# Patient Record
Sex: Female | Born: 1960
Health system: Southern US, Community
[De-identification: ages and names within clinical notes are randomized; demographics above are authoritative.]

## PROBLEM LIST (undated history)

## (undated) DIAGNOSIS — M170 Bilateral primary osteoarthritis of knee: Secondary | ICD-10-CM

## (undated) DIAGNOSIS — E559 Vitamin D deficiency, unspecified: Secondary | ICD-10-CM

## (undated) DIAGNOSIS — D219 Benign neoplasm of connective and other soft tissue, unspecified: Secondary | ICD-10-CM

## (undated) DIAGNOSIS — M25562 Pain in left knee: Secondary | ICD-10-CM

## (undated) DIAGNOSIS — J45909 Unspecified asthma, uncomplicated: Secondary | ICD-10-CM

## (undated) DIAGNOSIS — M1990 Unspecified osteoarthritis, unspecified site: Secondary | ICD-10-CM

## (undated) DIAGNOSIS — Z9889 Other specified postprocedural states: Secondary | ICD-10-CM

## (undated) DIAGNOSIS — N921 Excessive and frequent menstruation with irregular cycle: Secondary | ICD-10-CM

## (undated) DIAGNOSIS — N841 Polyp of cervix uteri: Secondary | ICD-10-CM

## (undated) HISTORY — PX: COMBINED HYSTEROSCOPY DIAGNOSTIC / D&C: SUR297

## (undated) HISTORY — DX: Excessive and frequent menstruation with irregular cycle: N92.1

## (undated) HISTORY — PX: TONSILLECTOMY: SUR1361

## (undated) HISTORY — DX: Polyp of cervix uteri: N84.1

## (undated) HISTORY — PX: KNEE SURGERY: SHX244

## (undated) HISTORY — DX: Other specified postprocedural states: Z98.890

## (undated) HISTORY — DX: Benign neoplasm of connective and other soft tissue, unspecified: D21.9

## (undated) HISTORY — DX: Unspecified asthma, uncomplicated: J45.909

## (undated) HISTORY — DX: Vitamin D deficiency, unspecified: E55.9

---
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
## 1993-08-15 HISTORY — PX: TUBAL LIGATION: SHX77

## 2001-07-24 ENCOUNTER — Encounter: Payer: Self-pay | Admitting: Internal Medicine

## 2001-07-24 ENCOUNTER — Encounter: Admission: RE | Admit: 2001-07-24 | Discharge: 2001-07-24 | Payer: Self-pay | Admitting: Internal Medicine

## 2001-08-07 ENCOUNTER — Other Ambulatory Visit: Admission: RE | Admit: 2001-08-07 | Discharge: 2001-08-07 | Payer: Self-pay | Admitting: Internal Medicine

## 2001-09-06 ENCOUNTER — Encounter: Payer: Self-pay | Admitting: Internal Medicine

## 2001-09-06 ENCOUNTER — Encounter: Admission: RE | Admit: 2001-09-06 | Discharge: 2001-09-06 | Payer: Self-pay | Admitting: Internal Medicine

## 2002-10-04 ENCOUNTER — Encounter: Payer: Self-pay | Admitting: Internal Medicine

## 2002-10-04 ENCOUNTER — Encounter: Admission: RE | Admit: 2002-10-04 | Discharge: 2002-10-04 | Payer: Self-pay | Admitting: Internal Medicine

## 2003-10-09 ENCOUNTER — Encounter: Admission: RE | Admit: 2003-10-09 | Discharge: 2003-10-09 | Payer: Self-pay | Admitting: Internal Medicine

## 2004-10-14 ENCOUNTER — Encounter: Admission: RE | Admit: 2004-10-14 | Discharge: 2004-10-14 | Payer: Self-pay | Admitting: Internal Medicine

## 2005-10-27 ENCOUNTER — Encounter: Admission: RE | Admit: 2005-10-27 | Discharge: 2005-10-27 | Payer: Self-pay | Admitting: Internal Medicine

## 2006-11-02 ENCOUNTER — Encounter: Admission: RE | Admit: 2006-11-02 | Discharge: 2006-11-02 | Payer: Self-pay | Admitting: Internal Medicine

## 2007-11-14 ENCOUNTER — Encounter: Admission: RE | Admit: 2007-11-14 | Discharge: 2007-11-14 | Payer: Self-pay | Admitting: Internal Medicine

## 2007-11-14 LAB — CONVERTED CEMR LAB
ALT: 21 units/L
AST: 22 units/L
Albumin: 0.5 g/dL
Alkaline Phosphatase: 67 units/L
BUN: 13 mg/dL
CO2: 20 meq/L
Calcium: 9.6 mg/dL
Chloride: 105 meq/L
Creatinine, Ser: 0.92 mg/dL
Glucose, Bld: 95 mg/dL
Hemoglobin: 13.4 g/dL
Platelets: 288 10*3/uL
Potassium: 4 meq/L
RBC: 4.4 M/uL
Sodium: 138 meq/L
TSH: 0.542 microintl units/mL
Total Bilirubin: 0.5 mg/dL
Total Protein: 7.4 g/dL
WBC: 6 10*3/uL

## 2007-12-05 ENCOUNTER — Encounter: Admission: RE | Admit: 2007-12-05 | Discharge: 2007-12-05 | Payer: Self-pay | Admitting: Internal Medicine

## 2008-11-26 ENCOUNTER — Encounter: Admission: RE | Admit: 2008-11-26 | Discharge: 2008-11-26 | Payer: Self-pay | Admitting: Internal Medicine

## 2008-11-26 ENCOUNTER — Encounter: Payer: Self-pay | Admitting: Internal Medicine

## 2008-12-03 ENCOUNTER — Encounter: Payer: Self-pay | Admitting: Internal Medicine

## 2008-12-03 ENCOUNTER — Encounter: Admission: RE | Admit: 2008-12-03 | Discharge: 2008-12-03 | Payer: Self-pay | Admitting: Internal Medicine

## 2008-12-17 HISTORY — PX: ENDOMETRIAL BIOPSY: SHX622

## 2009-11-19 LAB — CONVERTED CEMR LAB
ALT: 21 units/L
AST: 19 units/L
Albumin: 4.7 g/dL
Alkaline Phosphatase: 57 units/L
BUN: 11 mg/dL
CO2: 22 meq/L
Calcium: 10.1 mg/dL
Chloride: 102 meq/L
Cholesterol: 147 mg/dL
Creatinine, Ser: 0.94 mg/dL
Glucose, Bld: 99 mg/dL
HDL: 70 mg/dL
Hemoglobin: 12.5 g/dL
LDL Cholesterol: 66 mg/dL
Platelets: 336 10*3/uL
Potassium: 4.3 meq/L
RBC: 4.05 M/uL
Sodium: 136 meq/L
Total Bilirubin: 0.5 mg/dL
Total Protein: 7.9 g/dL
Triglyceride fasting, serum: 55 mg/dL
WBC: 5.5 10*3/uL

## 2009-11-25 ENCOUNTER — Encounter: Payer: Self-pay | Admitting: Internal Medicine

## 2009-11-25 LAB — CONVERTED CEMR LAB
ALT: 20 units/L
AST: 16 units/L
Albumin: 4.5 g/dL
Alkaline Phosphatase: 57 units/L
BUN: 10 mg/dL
CO2: 23 meq/L
Calcium: 10.1 mg/dL
Chloride: 100 meq/L
Cholesterol: 156 mg/dL
Creatinine, Ser: 0.82 mg/dL
Glucose, Bld: 89 mg/dL
Hemoglobin: 13.3 g/dL
Hgb A1c MFr Bld: 6.4 %
LDL Cholesterol: 70 mg/dL
Platelets: 299 10*3/uL
Potassium: 3.8 meq/L
RBC: 4.33 M/uL
Sodium: 135 meq/L
Total Bilirubin: 0.6 mg/dL
Total Protein: 7.3 g/dL
Triglyceride fasting, serum: 4976 mg/dL
WBC: 5 10*3/uL

## 2009-12-08 ENCOUNTER — Encounter: Admission: RE | Admit: 2009-12-08 | Discharge: 2009-12-08 | Payer: Self-pay | Admitting: Internal Medicine

## 2009-12-08 LAB — HM MAMMOGRAPHY: HM Mammogram: NEGATIVE

## 2009-12-23 ENCOUNTER — Ambulatory Visit: Payer: Self-pay | Admitting: Internal Medicine

## 2009-12-23 DIAGNOSIS — E119 Type 2 diabetes mellitus without complications: Secondary | ICD-10-CM

## 2009-12-23 DIAGNOSIS — R7303 Prediabetes: Secondary | ICD-10-CM | POA: Insufficient documentation

## 2009-12-23 DIAGNOSIS — E559 Vitamin D deficiency, unspecified: Secondary | ICD-10-CM | POA: Insufficient documentation

## 2010-02-16 ENCOUNTER — Telehealth: Payer: Self-pay | Admitting: Internal Medicine

## 2010-03-18 ENCOUNTER — Ambulatory Visit: Payer: Self-pay | Admitting: Internal Medicine

## 2010-03-18 LAB — CONVERTED CEMR LAB: Blood Glucose, Fingerstick: 124

## 2010-03-18 LAB — HM DIABETES FOOT EXAM

## 2010-03-22 ENCOUNTER — Telehealth: Payer: Self-pay | Admitting: Internal Medicine

## 2010-06-02 ENCOUNTER — Ambulatory Visit: Payer: Self-pay | Admitting: Internal Medicine

## 2010-06-02 LAB — CONVERTED CEMR LAB: Hgb A1c MFr Bld: 6.5 % (ref 4.6–6.5)

## 2010-09-05 ENCOUNTER — Encounter: Payer: Self-pay | Admitting: Internal Medicine

## 2010-09-14 NOTE — Assessment & Plan Note (Signed)
Summary: CHEST CONGESTION/[facility]WS   Vital Signs:  Patient profile:   50 year old female Height:      62 inches Weight:      189 pounds BMI:     34.69 O2 Sat:      98 % on Room air Temp:     100.5 degrees F oral Pulse rate:   92 / minute Pulse rhythm:   regular Resp:     16 per minute BP sitting:   120 / 78  (left arm) Cuff size:   large  Vitals Entered By: Lanier Prude, Beverly Gust) (March 18, 2010 2:34 PM)  Nutrition Counseling: Patient's BMI is greater than 25 and therefore counseled on weight management options.  O2 Flow:  Room air CC: cough, fever, sore throat X 5 days, URI symptoms Is Patient Diabetic? Yes Pain Assessment Patient in pain? no      CBG Result 124   Primary Care Provider:  Newt Lukes MD  CC:  cough, fever, sore throat X 5 days, and URI symptoms.  History of Present Illness:  URI Symptoms      This is a 50 year old woman who presents with URI symptoms.  The symptoms began 5 days ago.  The severity is described as mild.  The patient reports sore throat, productive cough, and sick contacts, but denies nasal congestion, clear nasal discharge, purulent nasal discharge, dry cough, and earache.  The patient denies fever, stiff neck, dyspnea, wheezing, rash, vomiting, diarrhea, use of an antipyretic, and response to antipyretic.  The patient also reports muscle aches.  The patient denies headache and severe fatigue.  Risk factors for Strep sinusitis include unilateral facial pain, unilateral nasal discharge, and Strep exposure.  The patient denies the following risk factors for Strep sinusitis: poor response to decongestant, double sickening, tender adenopathy, and absence of cough.    Preventive Screening-Counseling & Management  Alcohol-Tobacco     Alcohol drinks/day: 0     Alcohol Counseling: not indicated; patient does not drink     Smoking Status: quit     Packs/Day: 0.5     Year Quit: 1996     Tobacco Counseling: to remain off tobacco  products  Hep-HIV-STD-Contraception     Hepatitis Risk: no risk noted     HIV Risk: no risk noted     STD Risk: no risk noted      Sexual History:  currently monogamous.        Drug Use:  never.        Blood Transfusions:  no.    Medications Prior to Update: 1)  Biotin 5 Mg Caps (Biotin) .Marland Kitchen.. 1 By Mouth Once Daily 2)  Vitamin E 100 Unit Caps (Vitamin E) .... Daily 3)  Ferrous Sulfate 325 (65 Fe) Mg Tabs (Ferrous Sulfate) .Marland Kitchen.. 1 By Mouth Once Daily 4)  Vitamin D3 1000 Unit Caps (Cholecalciferol) .Marland Kitchen.. 1 By Mouth Once Daily (To Start Once Done With Prescription Vitd) 5)  Oscal 500/200 D-3 500-200 Mg-Unit Tabs (Calcium-Vitamin D) .Marland Kitchen.. 1 By Mouth Two Times A Day  Current Medications (verified): 1)  Biotin 5 Mg Caps (Biotin) .Marland Kitchen.. 1 By Mouth Once Daily 2)  Vitamin E 100 Unit Caps (Vitamin E) .... Daily 3)  Ferrous Sulfate 325 (65 Fe) Mg Tabs (Ferrous Sulfate) .Marland Kitchen.. 1 By Mouth Once Daily 4)  Vitamin D3 1000 Unit Caps (Cholecalciferol) .Marland Kitchen.. 1 By Mouth Once Daily (To Start Once Done With Prescription Vitd) 5)  Oscal 500/200 D-3 500-200 Mg-Unit Tabs (  Calcium-Vitamin D) .Marland Kitchen.. 1 By Mouth Two Times A Day 6)  Avelox 400 Mg Tabs (Moxifloxacin Hcl) .... One By Mouth Once Daily For 7 Days 7)  Mytussin Ac 100-10 Mg/37ml Syrp (Guaifenesin-Codeine) .... 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 Family History Diabetes 1st degree relative  mom - 65 - DM dad - 49 - DM  Social History: Reviewed history from 12/23/2009 and no changes required. 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   Endo:  Denies cold intolerance, excessive hunger, excessive thirst, excessive urination, heat intolerance, polyuria, and weight change.  Physical Exam  General:  alert, well-developed, well-nourished, well-hydrated, appropriate dress, normal appearance, healthy-appearing, cooperative to examination, and overweight-appearing.   Head:  normocephalic, atraumatic, no abnormalities observed, and no abnormalities palpated.   Eyes:  vision grossly intact, pupils equal, pupils round, and pupils reactive to light.   Ears:  R ear normal and L ear normal.   Nose:  External nasal examination shows no deformity or inflammation. Nasal mucosa are pink and moist without lesions or exudates. Mouth:  Oral mucosa and oropharynx without lesions or exudates.  Teeth in good repair. Neck:  supple, full ROM, no masses, no thyromegaly, no thyroid nodules or tenderness, no carotid bruits, no cervical lymphadenopathy, and no neck tenderness.   Lungs:  normal respiratory effort, no intercostal retractions, no accessory  muscle use, normal breath sounds, no dullness, no fremitus, no crackles, and no wheezes.   Heart:  normal rate, regular rhythm, no murmur, no gallop, no rub, and no JVD.   Abdomen:  soft, non-tender, normal bowel sounds, no distention, no masses, no guarding, no rigidity, no rebound tenderness, no hepatomegaly, and no splenomegaly.   Msk:  normal ROM, no joint tenderness, no joint swelling, no joint warmth, no redness over joints, no joint deformities, no joint instability, and no crepitation.   Pulses:  R and L carotid,radial,femoral,dorsalis pedis and posterior tibial pulses are full and equal bilaterally Extremities:  No clubbing, cyanosis, edema, or deformity noted with normal full range of motion of all joints.   Neurologic:  No cranial nerve deficits noted. Station and gait are normal. Plantar reflexes are down-going bilaterally. DTRs are symmetrical throughout. Sensory, motor and coordinative functions appear intact. Skin:  turgor  normal, color normal, no rashes, no suspicious lesions, no ecchymoses, no petechiae, no purpura, no ulcerations, and no edema.   Cervical Nodes:  no anterior cervical adenopathy and no posterior cervical adenopathy.   Axillary Nodes:  no R axillary adenopathy and no L axillary adenopathy.   Inguinal Nodes:  no R inguinal adenopathy and no L inguinal adenopathy.   Psych:  Cognition and judgment appear intact. Alert and cooperative with normal attention span and concentration. No apparent delusions, illusions, hallucinations  Diabetes Management Exam:    Foot Exam (with socks and/or shoes not present):       Sensory-Pinprick/Light touch:          Left medial foot (L-4): normal          Left dorsal foot (L-5): normal          Left lateral foot (S-1): normal          Right medial foot (L-4): normal          Right dorsal foot (L-5): normal          Right lateral foot (S-1): normal       Sensory-Monofilament:          Left foot: normal          Right foot: normal        Inspection:          Left foot: normal          Right foot: normal       Nails:          Left foot: normal          Right foot: normal   Impression & Recommendations:  Problem # 1:  BRONCHITIS-ACUTE (ICD-466.0) Assessment New  Her updated medication list for this problem includes:    Avelox 400 Mg Tabs (Moxifloxacin hcl) ..... One by mouth once daily for 7 days    Mytussin Ac 100-10 Mg/66ml Syrp (Guaifenesin-codeine) .Marland Kitchen... 5-10 ml by mouth qid as needed for cough  Take antibiotics and other medications as directed. Encouraged to push clear liquids, get enough rest, and take acetaminophen as needed. To be seen in 5-7 days if no improvement, sooner if worse.  Problem # 2:  COUGH (ICD-786.2) Assessment: New  Orders: T-2 View CXR (71020TC)  Problem # 3:  DIABETES MELLITUS, TYPE II (ICD-250.00) Assessment: Unchanged  Labs Reviewed: Creat: 0.82 (11/25/2009)    Reviewed HgBA1c results: 6.4 (11/25/2009)  Orders: Glucose, (CBG) (57846)  Complete Medication List: 1)  Biotin 5 Mg Caps (Biotin) .Marland Kitchen.. 1 by mouth once daily 2)  Vitamin E 100 Unit Caps (Vitamin e) .... Daily 3)  Ferrous Sulfate 325 (65 Fe) Mg Tabs (Ferrous sulfate) .Marland Kitchen.. 1 by mouth once daily 4)  Vitamin D3 1000 Unit Caps (Cholecalciferol) .Marland Kitchen.. 1 by mouth once daily (to start once done with prescription vitd) 5)  Oscal 500/200 D-3 500-200 Mg-unit Tabs (Calcium-vitamin d) .Marland Kitchen.. 1 by mouth two times a day 6)  Avelox 400 Mg Tabs (Moxifloxacin hcl) .... One by mouth once daily for 7 days 7)  Mytussin Ac 100-10 Mg/92ml Syrp (Guaifenesin-codeine) .... 5-10 ml by mouth qid as needed for cough  Patient Instructions: 1)  Please schedule a follow-up appointment in 2 weeks. 2)  Take your antibiotic as prescribed until ALL of it is gone, but stop if you develop a rash or swelling and contact our office as soon as possible. 3)  Acute bronchitis symptoms for less than 10 days are not helped by  antibiotics. take over the counter  cough medications. call if no improvment in  5-7 days, sooner if increasing cough, fever, or new symptoms( shortness of breath, chest pain). Prescriptions: MYTUSSIN AC 100-10 MG/5ML SYRP (GUAIFENESIN-CODEINE) 5-10 ml by mouth QID as needed for cough  #8 ounces x 0   Entered and Authorized by:   Etta Grandchild MD   Signed by:   Etta Grandchild MD on 03/18/2010   Method used:   Print then Give to Patient   RxID:   [id number] AVELOX 400 MG TABS (MOXIFLOXACIN HCL) One by mouth once daily for 7 days  #7 x 0   Entered and Authorized by:   Etta Grandchild MD   Signed by:   Etta Grandchild MD on 03/18/2010   Method used:   Samples Given   RxID:   [id number]   Laboratory Results   Blood Tests     CBG Random:: 124mg /dL

## 2010-09-14 NOTE — Progress Notes (Signed)
Summary: Cough syrup req  Phone Note Call from Patient Call back at Work Phone [phone]   Caller: Patient Summary of Call: Pt called stating that she was seen 12/16/2009 by Dr. Yetta Barre and was Rx'd ABX and Mytussin cough syrup. Pt states that cough syrup did not help especially with night time cough. Pt states that she was treated with Promethazine/Codeine cough syrup for Bronchitis by her previous MD a few years agoand this worked well. Pt is requesting new Rx for this cough syrup. Initial call taken by: Margaret Pyle, CMA,  March 22, 2010 8:17 AM  Follow-up for Phone Call        ok to call in (i listed med historically on med list) - thanks Follow-up by: Newt Lukes MD,  March 22, 2010 8:32 AM    New/Updated Medications: PROMETHAZINE VC/CODEINE 6.25-5-10 MG/5ML SYRP (PHENYLEPH-PROMETHAZINE-COD) 5cc by mouth every 4 hours as needed for cough Prescriptions: PROMETHAZINE VC/CODEINE 6.25-5-10 MG/5ML SYRP (PHENYLEPH-PROMETHAZINE-COD) 5cc by mouth every 4 hours as needed for cough  #6 oz x 0   Entered and Authorized by:   Margaret Pyle, CMA   Signed by:   Margaret Pyle, CMA on 03/22/2010   Method used:   Telephoned to ...       [facility]  [address] [phone]* (retail)       [address]       [place], [place]  [id number]       Ph: [phone] or [phone]       Fax: [phone]   RxID:   [id number] PROMETHAZINE VC/CODEINE 6.25-5-10 MG/5ML SYRP (PHENYLEPH-PROMETHAZINE-COD) 5cc by mouth every 4 hours as needed for cough  #6 oz x 0   Entered and Authorized by:   Newt Lukes MD   Signed by:   Newt Lukes MD on 03/22/2010   Method used:   Historical   RxID:   [id number]

## 2010-09-14 NOTE — Assessment & Plan Note (Signed)
Summary: 3 MO ROV /NWS   Vital Signs:  Patient profile:   50 year old female Height:      62 inches (157.48 cm) Weight:      196.0 pounds (89.09 kg) O2 Sat:      97 % on Room air Temp:     98.7 degrees F (37.06 degrees C) oral Pulse rate:   72 / minute BP sitting:   112 / 68  (left arm) Cuff size:   large  Vitals Entered By: Orlan Leavens RMA (June 02, 2010 11:00 AM)  O2 Flow:  Room air CC: 3 month follow-up Is Patient Diabetic? Yes Did you bring your meter with you today? No Pain Assessment Patient in pain? no        Primary Care Provider:  Newt Lukes MD  CC:  3 month follow-up.  History of Present Illness: here for f/u:  1) diet controlled DM2 - has been told prev she needs DM diet and weight loss to avoid DM medications - +FH DM - beginning May 1,2011 instituted self directed dietary changes to cut out sodas, no rice or white bread - also inc exercise throu walking, sit ups and arm weights - feels clothes already fitting better - denies PU/PD  2) vit D defic - reports compliance with ongoing medical treatment and no changes in medication dose or frequency. denies adverse side effects related to current therapy. - no constipation - no bone pain or hx fx - not taking calcium and eats little dairy food  3) obesity - as above, working on diet and weight changes - slow weight gain over time - no skin or bowel changes -    Clinical Review Panels:  Lipid Management   Cholesterol:  156 (11/25/2009)   LDL (bad choesterol):  70 (11/25/2009)   HDL (good cholesterol):  70 (11/19/2009)   Triglycerides:  4976 (11/25/2009)  Diabetes Management   HgBA1C:  6.4 (11/25/2009)   Creatinine:  0.82 (11/25/2009)   Last Foot Exam:  yes (03/18/2010)  CBC   WBC:  5.0 (11/25/2009)   RBC:  4.33 (11/25/2009)   Hgb:  13.3 (11/25/2009)   Platelets:  299 (11/25/2009)  Complete Metabolic Panel   Glucose:  89 (11/25/2009)   Sodium:  135 (11/25/2009)   Potassium:  3.8  (11/25/2009)   Chloride:  100 (11/25/2009)   CO2:  23 (11/25/2009)   BUN:  10 (11/25/2009)   Creatinine:  0.82 (11/25/2009)   Albumin:  4.5 (11/25/2009)   Total Protein:  7.3 (11/25/2009)   Calcium:  10.1 (11/25/2009)   Total Bili:  0.6 (11/25/2009)   Alk Phos:  57 (11/25/2009)   SGPT (ALT):  20 (11/25/2009)   SGOT (AST):  16 (11/25/2009)   Current Medications (verified): 1)  Biotin 5 Mg Caps (Biotin) .Marland Kitchen.. 1 By Mouth Once Daily 2)  Vitamin E 100 Unit Caps (Vitamin E) .... Daily 3)  Ferrous Sulfate 325 (65 Fe) Mg Tabs (Ferrous Sulfate) .Marland Kitchen.. 1 By Mouth Once Daily 4)  Vitamin D3 1000 Unit Caps (Cholecalciferol) .Marland Kitchen.. 1 By Mouth Once Daily (To Start Once Done With Prescription Vitd) 5)  Oscal 500/200 D-3 500-200 Mg-Unit Tabs (Calcium-Vitamin D) .Marland Kitchen.. 1 By Mouth Two Times A Day  Allergies (verified): No Known Drug Allergies  Past History:  Past Medical History: Diabetes mellitus, type II, diet controlled Vitamin D defic obesity  MD roster: gyn - ann roberts  Review of Systems  The patient denies anorexia, weight loss, chest pain, syncope, and headaches.  Physical Exam  General:  overweight-appearing.  alert, well-developed, well-nourished, and cooperative to examination.    Lungs:  normal respiratory effort, no intercostal retractions or use of accessory muscles; normal breath sounds bilaterally - no crackles and no wheezes.    Heart:  normal rate, regular rhythm, no murmur, and no rub. BLE without edema.   Impression & Recommendations:  Problem # 1:  DIABETES MELLITUS, TYPE II (ICD-250.00) diet controlled thus far reviewed need for weight control with diet and aerobic exercise consistently - if rise in a1c, will start metformin and rx glucometer - pt understands and agrees to same Orders: TLB-A1C / Hgb A1C (Glycohemoglobin) (83036-A1C)  Labs Reviewed: Creat: 0.82 (11/25/2009)    Reviewed HgBA1c results: 6.4 (11/25/2009)  Problem # 2:  OBESITY (ICD-278.00)  see  above - Ht: 62 (12/23/2009)   Wt: 194 (12/23/2009)   BMI: 35.61 (12/23/2009)  Ht: 62 (06/02/2010)   Wt: 196.0 (06/02/2010)   BMI: 34.69 (03/18/2010)  Problem # 3:  VITAMIN D DEFICIENCY (ICD-268.9)  prior labs reviewed - s/p rx replacement summer 2011 by gyn currently on OTC vit d + Calcium -   Complete Medication List: 1)  Biotin 5 Mg Caps (Biotin) .Marland Kitchen.. 1 by mouth once daily 2)  Vitamin E 100 Unit Caps (Vitamin e) .... Daily 3)  Ferrous Sulfate 325 (65 Fe) Mg Tabs (Ferrous sulfate) .Marland Kitchen.. 1 by mouth once daily 4)  Vitamin D3 1000 Unit Caps (Cholecalciferol) .Marland Kitchen.. 1 by mouth once daily (to start once done with prescription vitd) 5)  Oscal 500/200 D-3 500-200 Mg-unit Tabs (Calcium-vitamin d) .Marland Kitchen.. 1 by mouth two times a day  Patient Instructions: 1)  it was good to see you today. 2)  test(s) ordered today - your results will be posted on the phone tree for review in 48-72 hours from the time of test completion; call [phone] and enter your 9 digit MRN (listed above on this page, just below your name); if any changes need to be made or there are abnormal results, you will be contacted directly.  3)  Please schedule a follow-up appointment in 3-4 months to monitor diabetes control, call sooner if problems.  4)  it is important that you continue to work on losing weight - monitor your diet and consume fewer calories such as less carbohydrates (sugar) and less fat. you also need to increase your physical activity level - start by walking for 10-20 minutes 3 times per week and work up to 30 minutes 4-5 times each week. dance is good too - keep it up!   Orders Added: 1)  Est. Patient Level IV [09811] 2)  TLB-A1C / Hgb A1C (Glycohemoglobin) [83036-A1C]

## 2010-09-14 NOTE — Letter (Signed)
Summary: [facility]  [facility]   Imported By: Lester L[place] 12/28/2009 09:07:29  _____________________________________________________________________  External Attachment:    Type:   Image     Comment:   External Document

## 2010-09-14 NOTE — Progress Notes (Signed)
Summary: Pt?  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 Pt experienced SOB yesterday but has had improvement today. Pt wanted to inform MD and get any advise, if needed. Initial call taken by: Margaret Pyle, CMA,  February 16, 2010 3:16 PM  Follow-up for Phone Call        if breathing is normal now, nothing more to do except avoid mixture of these fumes in the future - if pain with breathing, SOB or cough, should make OV for eval of same - thanks Follow-up by: Newt Lukes MD,  February 16, 2010 4:00 PM  Additional Follow-up for Phone Call Additional follow up Details #1::        pt advised, states she does have slight cough but will watch for now and sch as needed Additional Follow-up by: Margaret Pyle, CMA,  February 16, 2010 4:05 PM

## 2010-09-14 NOTE — Assessment & Plan Note (Signed)
Summary: NEW [employer] PT  PKG #-- STC   Vital Signs:  Patient profile:   50 year old female Height:      62 inches (157.48 cm) Weight:      194 pounds (88.18 kg) BMI:     35.61 Temp:     98.5 degrees F (36.94 degrees C) oral Pulse rate:   90 / minute BP sitting:   110 / 70  (left arm) Cuff size:   regular  Vitals Entered By: Lamar Sprinkles, CMA (Dec 23, 2009 3:23 PM)  Nutrition Counseling: Patient's BMI is greater than 25 and therefore counseled on weight management options. CC: New Patient. Concerns w/family history diabetes and want decrease weight    Primary Care Provider:  Newt Lukes MD  CC:  New Patient. Concerns w/family history diabetes and want decrease weight .  History of Present Illness: new pt to me and our practice, here to est care - prev followed with [facility] - has records with her today for review -  1) DM? - has been told by recent providers that she is at risk and needs DM diet and weight loss to avoid DM medications - +FH DM - beginning May 1,2011 instituted self directed dietary changes to cut out sodas, no rice or white bread - also inc exercise throu walking, sit ups and arm weights - feels clothes already fitting better - denies PU/PD  2) vit D defic - reports compliance with ongoing medical treatment and no changes in medication dose or frequency. denies adverse side effects related to current therapy. - no constipation - no bone pain or hx fx - not taking calcium and eats little dairy food  3) obesity - as above, working on diet and weight changes - slow weight gain over - no skin or bowel changes -   Preventive Screening-Counseling & Management  Alcohol-Tobacco     Alcohol drinks/day: 0     Alcohol Counseling: not indicated; patient does not drink     Smoking Status: quit     Packs/Day: 0.5     Year Quit: 1996     Tobacco Counseling: to remain off tobacco products  Caffeine-Diet-Exercise     Caffeine Counseling: not indicated; caffeine  use is not excessive or problematic     Diet Counseling: to improve diet; diet is suboptimal     Nutrition Referrals: no     Does Patient Exercise: yes     Exercise Counseling: to improve exercise regimen     Depression Counseling: not indicated; screening negative for depression  Safety-Violence-Falls     Seat Belt Counseling: not indicated; patient wears seat belts     Helmet Counseling: not applicable     Firearm Counseling: not applicable     Smoke Detectors: yes     Smoke Detector Counseling: no     Violence Counseling: not indicated; no violence risk noted     Fall Risk Counseling: not indicated; no significant falls noted  Clinical Review Panels:  Prevention   Last Mammogram:  ASSESSMENT: Negative - BI-RADS 1^MM DIGITAL SCREENING (12/08/2009)  Immunizations   Last Tetanus Booster:  Tdap (11/19/2008)  Lipid Management   Cholesterol:  156 (11/25/2009)   LDL (bad choesterol):  70 (11/25/2009)   HDL (good cholesterol):  70 (11/19/2009)   Triglycerides:  4976 (11/25/2009)  Diabetes Management   HgBA1C:  6.4 (11/25/2009)   Creatinine:  0.82 (11/25/2009)  CBC   WBC:  5.0 (11/25/2009)   RBC:  4.33 (11/25/2009)  Hgb:  13.3 (11/25/2009)   Platelets:  299 (11/25/2009)  Complete Metabolic Panel   Glucose:  89 (11/25/2009)   Sodium:  135 (11/25/2009)   Potassium:  3.8 (11/25/2009)   Chloride:  100 (11/25/2009)   CO2:  23 (11/25/2009)   BUN:  10 (11/25/2009)   Creatinine:  0.82 (11/25/2009)   Albumin:  4.5 (11/25/2009)   Total Protein:  7.3 (11/25/2009)   Calcium:  10.1 (11/25/2009)   Total Bili:  0.6 (11/25/2009)   Alk Phos:  57 (11/25/2009)   SGPT (ALT):  20 (11/25/2009)   SGOT (AST):  16 (11/25/2009)   -  Date:  11/25/2009    Cholesterol: 156    LDL: 70    Triglycerides: 3329    HgbA1c: 6.4    BG Random: 89    BUN: 10    Creatinine: 0.82    Sodium: 135    Potassium: 3.8    Chloride: 100    CO2 Total: 23    SGOT (AST): 16    SGPT (ALT): 20    T.  Bilirubin: 0.6    Alk Phos: 57    Calcium: 10.1    Total Protein: 7.3    Albumin: 4.5    WBC: 5.0    HGB: 13.3    RBC: 4.33    PLT: 299  Date:  11/19/2009    Cholesterol: 147    LDL: 66    Triglycerides: 55    BG Random: 99    BUN: 11    Creatinine: 0.94    Sodium: 136    Potassium: 4.3    Chloride: 102    CO2 Total: 22    SGOT (AST): 19    SGPT (ALT): 21    T. Bilirubin: 0.5    Alk Phos: 57    Calcium: 10.1    Total Protein: 7.9    Albumin: 4.7    WBC: 5.5    HGB: 12.5    RBC: 4.05    PLT: 336    HDL: 70  Date:  11/14/2007    BG Random: 95    BUN: 13    Creatinine: 0.92    Sodium: 138    Potassium: 4.0    Chloride: 105    CO2 Total: 20    SGOT (AST): 22    SGPT (ALT): 21    T. Bilirubin: 0.5    Alk Phos: 67    Calcium: 9.6    Total Protein: 7.4    Albumin: 0.5    WBC: 6.0    HGB: 13.4    RBC: 4.40    PLT: 288    TSH: 0.542  Current Medications (verified): 1)  Vitamin D (Ergocalciferol) 50000 Unit Caps (Ergocalciferol) .... Twice Weekly 2)  Biotin .... Daily 3)  Vitamin E 100 Unit Caps (Vitamin E) .... Daily 4)  Otc Iron .... 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 I have reviewed all other systems and they were negative.   Physical Exam  General:  overweight-appearing.  alert, well-developed, well-nourished, and cooperative to examination.    Eyes:  vision grossly intact; pupils equal,  round and reactive to light.  conjunctiva and lids normal.    Ears:  normal pinnae bilaterally, without erythema, swelling, or tenderness to palpation. TMs clear, without effusion, or cerumen impaction. Hearing grossly normal bilaterally  Mouth:  teeth and gums in good repair; mucous membranes moist, without lesions or ulcers. oropharynx clear without exudate, no erythema.  Neck:  thick, supple, full ROM, no masses, no thyromegaly; no thyroid nodules or tenderness. no JVD or carotid bruits.   Lungs:  normal respiratory effort, no intercostal retractions or use of accessory muscles; normal breath sounds bilaterally - no crackles and no wheezes.    Heart:  normal rate, regular rhythm, no murmur, and no rub. BLE without edema. normal DP pulses and normal cap refill in all 4 extremities    Abdomen:  obese, soft, non-tender, normal bowel sounds, no distention; no masses and no appreciable hepatomegaly or splenomegaly.   Genitalia:  defer gyn Msk:  No deformity or scoliosis noted of thoracic or lumbar spine.   Neurologic:  alert & oriented X3 and cranial nerves II-XII symetrically intact.  strength normal in all extremities, sensation intact to light touch, and gait normal. speech fluent without dysarthria or aphasia; follows commands with good comprehension.  Skin:  acanthosis nigrans at posterior neck folds Psych:  Oriented X3, memory intact for recent and remote, normally interactive, good eye contact, not anxious appearing, not depressed appearing, and not agitated.      Impression & Recommendations:  Problem # 1:  DIABETES MELLITUS, TYPE II (ICD-250.00)  diet controlled by a1c - long discussion on deit and exercise with weight loss to control same - offered nutrition referral, declines at this time education provided in office re: carb types and counting as well as pt information to take home Time spent with patient (45) minutes, more than 50% of this time was spent counseling patient on  diabetes and obesity control - also review of prior labs and records from PCP brought in today - to be copied into EMR  Labs Reviewed: Creat: 0.82 (11/25/2009)    Reviewed HgBA1c results: 6.4 (11/25/2009)  Problem # 2:  OBESITY (ICD-278.00) see above - Ht: 62 (12/23/2009)   Wt: 194 (12/23/2009)   BMI: 35.61 (12/23/2009)  Problem # 3:  VITAMIN D DEFICIENCY (ICD-268.9) labs reviewd -  currently on rx vit d replacment - to complete this mo - then begin OTC vit d + Calcium -   Complete Medication List: 1)  Vitamin D (ergocalciferol) 50000 Unit Caps (Ergocalciferol) .... Twice weekly 2)  Biotin 5 Mg Caps (Biotin) .Marland Kitchen.. 1 by mouth once daily 3)  Vitamin E 100 Unit Caps (Vitamin e) .... Daily 4)  Ferrous Sulfate 325 (65 Fe) Mg Tabs (Ferrous sulfate) .Marland Kitchen.. 1 by mouth once daily 5)  Vitamin D3 1000 Unit Caps (Cholecalciferol) .Marland Kitchen.. 1 by mouth once daily (to start once done with prescription vitd) 6)  Oscal 500/200 D-3 500-200 Mg-unit Tabs (Calcium-vitamin d) .Marland Kitchen.. 1 by mouth two times a day  Patient Instructions: 1)  it was good to see you today. 2)  add calcium to your vitimins - see below 3)  it is important that you continue to work on losing weight as you are doing - monitor your diet and consume fewer calories and  less fat. you also need to increase your physical activity level - start by walking for 10-20 minutes 3 times per week and work up to 30 minutes 4-5 times each week.  4)  Please schedule a follow-up appointment in 3 months to recheck for a1c (diabetes bloos work), call sooner if problems.  5)  Let us know if you would like a referral to nutrtionist for further diet education and weight loss and diabetes -   Immunization History:  Tetanus/Td Immunization History:    Tetanus/Td:  tdap (11/19/2008)

## 2010-10-06 ENCOUNTER — Ambulatory Visit: Payer: Self-pay | Admitting: Internal Medicine

## 2010-11-02 ENCOUNTER — Other Ambulatory Visit: Payer: Self-pay | Admitting: Internal Medicine

## 2010-11-02 DIAGNOSIS — Z1231 Encounter for screening mammogram for malignant neoplasm of breast: Secondary | ICD-10-CM

## 2010-11-25 ENCOUNTER — Encounter: Payer: Self-pay | Admitting: Internal Medicine

## 2010-11-26 ENCOUNTER — Ambulatory Visit (INDEPENDENT_AMBULATORY_CARE_PROVIDER_SITE_OTHER): Payer: Managed Care, Other (non HMO) | Admitting: Internal Medicine

## 2010-11-26 ENCOUNTER — Encounter: Payer: Self-pay | Admitting: Internal Medicine

## 2010-11-26 VITALS — BP 130/82 | HR 79 | Temp 98.4°F | Ht 62.0 in | Wt 194.0 lb

## 2010-11-26 DIAGNOSIS — S43422A Sprain of left rotator cuff capsule, initial encounter: Secondary | ICD-10-CM | POA: Insufficient documentation

## 2010-11-26 DIAGNOSIS — S43429A Sprain of unspecified rotator cuff capsule, initial encounter: Secondary | ICD-10-CM

## 2010-11-26 MED ORDER — MELOXICAM 15 MG PO TABS: 15.0000 mg | ORAL_TABLET | Freq: Every day | ORAL | Status: DC
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
## 2010-11-26 NOTE — Assessment & Plan Note (Signed)
Add meloxicam antiinflammatory med for pain - ok to cont use of narcotic and muscle relaxant qhs as tolerated Refer to PT If unimproved or worse in next 2 weeks, pt to call for referral to ortho as needed Pt understands and agrees to same

## 2010-12-10 ENCOUNTER — Ambulatory Visit
Admission: RE | Admit: 2010-12-10 | Discharge: 2010-12-10 | Disposition: A | Discharge status: Home / Self Care | Payer: Managed Care, Other (non HMO) | Source: Ambulatory Visit | Attending: Internal Medicine | Admitting: Internal Medicine

## 2010-12-10 ENCOUNTER — Ambulatory Visit
Admission: RE | Admit: 2010-12-10 | Discharge: 2010-12-10 | Disposition: A | Discharge status: Home / Self Care | Payer: Managed Care, Other (non HMO) | Source: Ambulatory Visit

## 2010-12-10 DIAGNOSIS — Z1231 Encounter for screening mammogram for malignant neoplasm of breast: Secondary | ICD-10-CM

## 2010-12-23 ENCOUNTER — Other Ambulatory Visit: Payer: Self-pay | Admitting: Internal Medicine

## 2010-12-23 DIAGNOSIS — Z1231 Encounter for screening mammogram for malignant neoplasm of breast: Secondary | ICD-10-CM

## 2010-12-24 ENCOUNTER — Encounter: Payer: Self-pay | Admitting: Internal Medicine

## 2010-12-28 ENCOUNTER — Encounter: Payer: Self-pay | Admitting: Internal Medicine

## 2010-12-29 ENCOUNTER — Ambulatory Visit (INDEPENDENT_AMBULATORY_CARE_PROVIDER_SITE_OTHER): Payer: Managed Care, Other (non HMO) | Admitting: Internal Medicine

## 2010-12-29 ENCOUNTER — Encounter: Payer: Self-pay | Admitting: Internal Medicine

## 2010-12-29 ENCOUNTER — Other Ambulatory Visit (INDEPENDENT_AMBULATORY_CARE_PROVIDER_SITE_OTHER): Payer: Managed Care, Other (non HMO)

## 2010-12-29 VITALS — BP 120/74 | HR 86 | Temp 98.5°F | Wt 193.0 lb

## 2010-12-29 DIAGNOSIS — S43422A Sprain of left rotator cuff capsule, initial encounter: Secondary | ICD-10-CM

## 2010-12-29 DIAGNOSIS — S43429A Sprain of unspecified rotator cuff capsule, initial encounter: Secondary | ICD-10-CM

## 2010-12-29 DIAGNOSIS — M25562 Pain in left knee: Secondary | ICD-10-CM | POA: Insufficient documentation

## 2010-12-29 DIAGNOSIS — E119 Type 2 diabetes mellitus without complications: Secondary | ICD-10-CM

## 2010-12-29 DIAGNOSIS — Z0000 Encounter for general adult medical examination without abnormal findings: Secondary | ICD-10-CM

## 2010-12-29 DIAGNOSIS — M25569 Pain in unspecified knee: Secondary | ICD-10-CM

## 2010-12-29 DIAGNOSIS — E559 Vitamin D deficiency, unspecified: Secondary | ICD-10-CM

## 2010-12-29 DIAGNOSIS — Z136 Encounter for screening for cardiovascular disorders: Secondary | ICD-10-CM

## 2010-12-29 HISTORY — DX: Pain in left knee: M25.562

## 2010-12-29 LAB — LIPID PANEL
Cholesterol: 118 mg/dL (ref 0–200)
HDL: 56 mg/dL (ref >39.00)
LDL Cholesterol: 56 mg/dL (ref 0–99)
Total CHOL/HDL Ratio: 2
Triglycerides: 30 mg/dL (ref 0.0–149.0)
VLDL: 6 mg/dL (ref 0.0–40.0)

## 2010-12-29 LAB — URINALYSIS
Bilirubin Urine: NEGATIVE
Ketones, ur: NEGATIVE
Leukocytes, UA: NEGATIVE
Nitrite: NEGATIVE
Specific Gravity, Urine: 1.02 (ref 1.000–1.030)
Total Protein, Urine: NEGATIVE
Urine Glucose: NEGATIVE
Urobilinogen, UA: 0.2 (ref 0.0–1.0)
pH: 6 (ref 5.0–8.0)

## 2010-12-29 LAB — HEPATIC FUNCTION PANEL
ALT: 21 U/L (ref 0–35)
AST: 15 U/L (ref 0–37)
Albumin: 3.9 g/dL (ref 3.5–5.2)
Alkaline Phosphatase: 47 U/L (ref 39–117)
Bilirubin, Direct: 0.1 mg/dL (ref 0.0–0.3)
Total Bilirubin: 0.6 mg/dL (ref 0.3–1.2)
Total Protein: 6.9 g/dL (ref 6.0–8.3)

## 2010-12-29 LAB — CBC WITH DIFFERENTIAL/PLATELET
Basophils Absolute: 0 10*3/uL (ref 0.0–0.1)
Basophils Relative: 0.3 % (ref 0.0–3.0)
Eosinophils Absolute: 0.2 10*3/uL (ref 0.0–0.7)
Eosinophils Relative: 2.5 % (ref 0.0–5.0)
HCT: 38.5 % (ref 36.0–46.0)
Hemoglobin: 13.2 g/dL (ref 12.0–15.0)
Lymphocytes Relative: 18.9 % (ref 12.0–46.0)
Lymphs Abs: 1.3 10*3/uL (ref 0.7–4.0)
MCHC: 34.2 g/dL (ref 30.0–36.0)
MCV: 94.7 fl (ref 78.0–100.0)
Monocytes Absolute: 0.6 10*3/uL (ref 0.1–1.0)
Monocytes Relative: 9.2 % (ref 3.0–12.0)
Neutro Abs: 4.6 10*3/uL (ref 1.4–7.7)
Neutrophils Relative %: 69.1 % (ref 43.0–77.0)
Platelets: 278 10*3/uL (ref 150.0–400.0)
RBC: 4.07 Mil/uL (ref 3.87–5.11)
RDW: 14.5 % (ref 11.5–14.6)
WBC: 6.7 10*3/uL (ref 4.5–10.5)

## 2010-12-29 LAB — TSH: TSH: 0.46 u[IU]/mL (ref 0.35–5.50)

## 2010-12-29 LAB — BASIC METABOLIC PANEL
BUN: 12 mg/dL (ref 6–23)
CO2: 29 mEq/L (ref 19–32)
Calcium: 10.1 mg/dL (ref 8.4–10.5)
Chloride: 105 mEq/L (ref 96–112)
Creatinine, Ser: 0.8 mg/dL (ref 0.4–1.2)
GFR: 103.45 mL/min (ref >60.00)
Glucose, Bld: 101 mg/dL — ABNORMAL HIGH (ref 70–99)
Potassium: 4.9 mEq/L (ref 3.5–5.1)
Sodium: 139 mEq/L (ref 135–145)

## 2010-12-29 LAB — HEMOGLOBIN A1C: Hgb A1c MFr Bld: 6.5 % (ref 4.6–6.5)

## 2010-12-29 NOTE — Assessment & Plan Note (Signed)
diet controlled - check a1c now - continue to work on weight, exercise and diet Lab Results  Component Value Date   HGBA1C 6.5 06/02/2010
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
## 2010-12-29 NOTE — Assessment & Plan Note (Signed)
Check level now - continue oral replacement 

## 2010-12-29 NOTE — Patient Instructions (Signed)
It was good to see you today. Test(s) ordered today. Your results will be called to you after review (48-72hours after test completion). If any changes need to be made, you will be notified at that time. Exam and EKG look great - continue to follow with gynecology as ongoing we'll make referral to GI for screening colonoscopy and to PT for your knee . Our office will contact you regarding appointment(s) once made. Continue meloxicam for inflammation and pain Work on lifestyle changes as discussed (low fat, low carb, increased protein diet; improved exercise efforts; weight loss) to control sugar, blood pressure and cholesterol levels and/or reduce risk of developing other medical problems. Look into [email or web address] or other type of food journal to assist you in this process. Please schedule followup in 6 months, call sooner if problems.

## 2010-12-29 NOTE — Assessment & Plan Note (Signed)
Improved with ongoing PT -continue meloxicam antiinflammatory med for pain - If unimproved or worse, pt to call for referral to ortho as needed Pt understands and agrees to same
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
## 2010-12-30 LAB — VITAMIN D 25 HYDROXY (VIT D DEFICIENCY, FRACTURES): Vit D, 25-Hydroxy: 31 ng/mL (ref 30–89)

## 2011-01-06 ENCOUNTER — Telehealth: Payer: Self-pay

## 2011-01-06 DIAGNOSIS — M25569 Pain in unspecified knee: Secondary | ICD-10-CM

## 2011-01-06 NOTE — Telephone Encounter (Signed)
[facility] called requesting approval for pt to have Iontophoresis for LT knee pain. If this is okay a referral needs to be faxed to [phone].

## 2011-01-06 NOTE — Telephone Encounter (Signed)
Ok - please generate rx for same, icd-9 719.46 - thanks

## 2011-01-07 NOTE — Telephone Encounter (Signed)
Order generated, signed and faxed to [phone]

## 2011-01-12 ENCOUNTER — Ambulatory Visit (AMBULATORY_SURGERY_CENTER): Payer: Managed Care, Other (non HMO) | Admitting: *Deleted

## 2011-01-12 VITALS — Ht 62.0 in | Wt 193.6 lb

## 2011-01-12 DIAGNOSIS — Z1211 Encounter for screening for malignant neoplasm of colon: Secondary | ICD-10-CM

## 2011-01-12 MED ORDER — PEG-KCL-NACL-NASULF-NA ASC-C 100 G PO SOLR: ORAL | Status: DC

## 2011-01-25 ENCOUNTER — Ambulatory Visit (AMBULATORY_SURGERY_CENTER): Payer: Managed Care, Other (non HMO) | Admitting: Internal Medicine

## 2011-01-25 ENCOUNTER — Encounter: Payer: Self-pay | Admitting: Internal Medicine

## 2011-01-25 VITALS — BP 136/76 | HR 70 | Temp 97.8°F | Resp 16 | Ht 62.0 in | Wt 190.0 lb

## 2011-01-25 DIAGNOSIS — Z1211 Encounter for screening for malignant neoplasm of colon: Secondary | ICD-10-CM

## 2011-01-25 DIAGNOSIS — K5730 Diverticulosis of large intestine without perforation or abscess without bleeding: Secondary | ICD-10-CM

## 2011-01-25 MED ORDER — SODIUM CHLORIDE 0.9 % IV SOLN: 500.0000 mL | INTRAVENOUS | Status: DC
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
## 2011-01-25 NOTE — Patient Instructions (Signed)
Resume all medications. Information given for diverticulosis and high fiber diet.

## 2011-01-26 ENCOUNTER — Telehealth: Payer: Self-pay | Admitting: *Deleted

## 2011-01-26 NOTE — Telephone Encounter (Signed)
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 Due to work #.

## 2011-02-04 ENCOUNTER — Ambulatory Visit: Payer: Managed Care, Other (non HMO) | Admitting: Internal Medicine

## 2011-02-11 ENCOUNTER — Ambulatory Visit (INDEPENDENT_AMBULATORY_CARE_PROVIDER_SITE_OTHER): Payer: Managed Care, Other (non HMO) | Admitting: Internal Medicine

## 2011-02-11 ENCOUNTER — Encounter: Payer: Self-pay | Admitting: Internal Medicine

## 2011-02-11 VITALS — BP 120/82 | HR 73 | Temp 98.5°F | Ht 62.0 in | Wt 196.4 lb

## 2011-02-11 DIAGNOSIS — M25569 Pain in unspecified knee: Secondary | ICD-10-CM

## 2011-02-11 DIAGNOSIS — M25562 Pain in left knee: Secondary | ICD-10-CM

## 2011-02-11 MED ORDER — TRAMADOL HCL 50 MG PO TABS: 50.0000 mg | ORAL_TABLET | Freq: Four times a day (QID) | ORAL | Status: AC | PRN
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
## 2011-02-11 NOTE — Patient Instructions (Signed)
It was good to see you today. we'll make referral to orthopedist at [facility] for your knee. Our office will contact you regarding appointment(s) once made. Also try tramadol in addition to meloxicam for knee pain - Your prescription(s) have been submitted to your pharmacy. Please take as directed and contact our office if you believe you are having problem(s) with the medication(s).
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
## 2011-04-03 ENCOUNTER — Emergency Department (HOSPITAL_COMMUNITY)
Admission: EM | Admit: 2011-04-03 | Discharge: 2011-04-03 | Disposition: A | Discharge status: Home / Self Care | Payer: No Typology Code available for payment source | Attending: Emergency Medicine | Admitting: Emergency Medicine

## 2011-04-03 DIAGNOSIS — M25569 Pain in unspecified knee: Secondary | ICD-10-CM | POA: Insufficient documentation

## 2011-06-29 ENCOUNTER — Other Ambulatory Visit (HOSPITAL_COMMUNITY): Payer: Self-pay | Admitting: Orthopedic Surgery

## 2011-06-29 DIAGNOSIS — M25562 Pain in left knee: Secondary | ICD-10-CM

## 2011-07-12 ENCOUNTER — Encounter (HOSPITAL_COMMUNITY)
Admission: RE | Admit: 2011-07-12 | Discharge: 2011-07-12 | Disposition: A | Discharge status: Home / Self Care | Payer: Managed Care, Other (non HMO) | Source: Ambulatory Visit | Attending: Orthopedic Surgery | Admitting: Orthopedic Surgery

## 2011-07-12 DIAGNOSIS — M25562 Pain in left knee: Secondary | ICD-10-CM

## 2011-07-12 DIAGNOSIS — M25569 Pain in unspecified knee: Secondary | ICD-10-CM | POA: Insufficient documentation

## 2011-07-12 MED ORDER — TECHNETIUM TC 99M MEDRONATE IV KIT
24.0000 | PACK | Freq: Once | INTRAVENOUS | Status: AC | PRN
  Administered 2011-07-12: 24 via INTRAVENOUS

## 2011-09-06 ENCOUNTER — Ambulatory Visit: Payer: Managed Care, Other (non HMO) | Admitting: Endocrinology

## 2011-09-07 ENCOUNTER — Encounter: Payer: Self-pay | Admitting: Endocrinology

## 2011-09-07 ENCOUNTER — Ambulatory Visit (INDEPENDENT_AMBULATORY_CARE_PROVIDER_SITE_OTHER): Payer: Managed Care, Other (non HMO) | Admitting: Endocrinology

## 2011-09-07 ENCOUNTER — Ambulatory Visit (INDEPENDENT_AMBULATORY_CARE_PROVIDER_SITE_OTHER)
Admission: RE | Admit: 2011-09-07 | Discharge: 2011-09-07 | Disposition: A | Discharge status: Home / Self Care | Payer: Managed Care, Other (non HMO) | Source: Ambulatory Visit | Attending: Endocrinology | Admitting: Endocrinology

## 2011-09-07 VITALS — BP 122/78 | HR 86 | Temp 98.6°F | Ht 67.0 in | Wt 195.0 lb

## 2011-09-07 DIAGNOSIS — R059 Cough, unspecified: Secondary | ICD-10-CM

## 2011-09-07 DIAGNOSIS — R05 Cough: Secondary | ICD-10-CM

## 2011-09-07 MED ORDER — AZITHROMYCIN 500 MG PO TABS: 500.0000 mg | ORAL_TABLET | Freq: Every day | ORAL | Status: DC

## 2011-09-07 MED ORDER — PROMETHAZINE-CODEINE 6.25-10 MG/5ML PO SYRP: 5.0000 mL | ORAL_SOLUTION | ORAL | Status: AC | PRN

## 2011-09-07 NOTE — Patient Instructions (Addendum)
i have sent a prescription to your pharmacy, for an antibiotic here is a sample of "[id number]."  take 1 puff 2x a day, as needed for wheezing.  rinse mouth after using. A chest-x-ray is being requested for you today.  please call [phone] to hear your test results.  You will be prompted to enter the 9-digit "MRN" number that appears at the top left of this page, followed by #.  Then you will hear the message. Here is a prescription for cough syrup.   I hope you feel better soon.  If you don't feel better by next week, please call back (update: i left message on phone-tree:  rx as we discussed)

## 2011-09-07 NOTE — Progress Notes (Signed)
  Subjective:    Patient ID: Grace Lyons, female    DOB: 1961/06/26, 51 y.o.   MRN: [id number]  HPI Pt states 1 week of prod-quality cough in the chest, and assoc wheezing.  She also has headache.  Past Medical History  Diagnosis Date  . VITAMIN D DEFICIENCY   . OBESITY   . DIABETES MELLITUS, TYPE II     diet controlled    Past Surgical History  Procedure Date  . No past surgeries     History   Social History  . Marital Status: Single    Spouse Name: N/A    Number of Children: N/A  . Years of Education: N/A   Occupational History  . Not on file.   Social History Main Topics  . Smoking status: Former Smoker    Quit date: 01/12/1996  . Smokeless tobacco: Not on file   Comment: Single, lives alone. 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 Alcohol Use: No  . Drug Use: No  . Sexually Active: Not on file   Other Topics Concern  . Not on file   Social History Narrative  . No narrative on file    Current Outpatient Prescriptions on File Prior to Visit  Medication Sig Dispense Refill  . Biotin 5 MG TABS Take by mouth daily.        . calcium-vitamin D (OSCAL WITH D) 500-200 MG-UNIT per tablet Take 1 tablet by mouth 2 (two) times daily.        . Cholecalciferol (VITAMIN D3) 1000 UNITS CAPS Take by mouth daily.        . ferrous sulfate 324 (65 FE) MG TBEC Take by mouth daily.        . meloxicam (MOBIC) 15 MG tablet Take 1 tablet (15 mg total) by mouth daily.  30 tablet  2  . vitamin E 200 UNIT capsule Take 200 Units by mouth daily.         Current Facility-Administered Medications on File Prior to Visit  Medication Dose Route Frequency Provider Last Rate Last Dose  . 0.9 %  sodium chloride infusion  500 mL Intravenous Continuous Iva Boop, MD        No Known Allergies  Family History  Problem Relation Age of Onset  . Diabetes Mother   . Diabetes Father   . Diabetes Other     BP 122/78  Pulse 86  Temp(Src)  98.6 F (37 C) (Oral)  Ht 5\' 7"  (1.702 m)  Wt 195 lb (88.451 kg)  BMI 30.54 kg/m2  SpO2 94%  LMP 12/14/2010    Review of Systems Denies fever and sob    Objective:   Physical Exam VITAL SIGNS:  See vs page GENERAL: no distress head: no deformity eyes: no periorbital swelling, no proptosis external nose and ears are normal mouth: no lesion seen Right tm is red.  Left is normal.   LUNGS:  Clear to auscultation.   (CXR: nad)    Assessment & Plan:  Acute bronchtis, new

## 2011-09-09 ENCOUNTER — Telehealth: Payer: Self-pay

## 2011-09-09 NOTE — Telephone Encounter (Signed)
You are on the antibiotic.  T\drops won't help, but the cough medication is good for apin

## 2011-09-09 NOTE — Telephone Encounter (Signed)
Pt informed of MD's advisement. 

## 2011-09-09 NOTE — Telephone Encounter (Signed)
Pt called stating she has now developed some moderate ear pain. Pt is requesting Rx (eardrops?) to treat pain, please advise.

## 2011-09-23 ENCOUNTER — Other Ambulatory Visit: Payer: Self-pay

## 2011-09-23 MED ORDER — AZITHROMYCIN 500 MG PO TABS: 500.0000 mg | ORAL_TABLET | Freq: Every day | ORAL | Status: AC

## 2011-09-23 NOTE — Telephone Encounter (Signed)
Pt called stating that infection was not completed resolved with ABX, still has congestion. Pt is requesting a refill of Zpak, please advise.

## 2011-09-23 NOTE — Telephone Encounter (Signed)
Pt advised.

## 2011-09-23 NOTE — Telephone Encounter (Signed)
Ok, but if unimproved after 2nd course, needs ROV to eval for possible other problem - thanks

## 2011-09-28 ENCOUNTER — Ambulatory Visit (INDEPENDENT_AMBULATORY_CARE_PROVIDER_SITE_OTHER): Payer: Managed Care, Other (non HMO) | Admitting: Internal Medicine

## 2011-09-28 ENCOUNTER — Encounter: Payer: Self-pay | Admitting: Internal Medicine

## 2011-09-28 VITALS — BP 120/72 | HR 89 | Temp 99.2°F | Wt 201.0 lb

## 2011-09-28 DIAGNOSIS — R05 Cough: Secondary | ICD-10-CM

## 2011-09-28 DIAGNOSIS — J45909 Unspecified asthma, uncomplicated: Secondary | ICD-10-CM

## 2011-09-28 DIAGNOSIS — R059 Cough, unspecified: Secondary | ICD-10-CM

## 2011-09-28 MED ORDER — ALBUTEROL SULFATE HFA 108 (90 BASE) MCG/ACT IN AERS: 2.0000 | INHALATION_SPRAY | Freq: Four times a day (QID) | RESPIRATORY_TRACT | Status: DC | PRN

## 2011-09-28 MED ORDER — METHYLPREDNISOLONE ACETATE 80 MG/ML IJ SUSP
120.0000 mg | Freq: Once | INTRAMUSCULAR | Status: AC
  Administered 2011-09-28: 120 mg via INTRAMUSCULAR

## 2011-09-28 NOTE — Patient Instructions (Signed)
It was good to see you today. If you develop worsening symptoms or fever, call and we can reconsider antibiotics, but it does not appear necessary to use antibiotics at this time. Steroid shot given for chest tightness and cough today Use albuterol inhaler every 6 hours as needed for cough or shortness of breath - Your prescription(s) have been submitted to your pharmacy. Please take as directed and contact our office if you believe you are having problem(s) with the medication(s). Will refer for pulmonary function testing to look for asthma - our office will call you with this appointment, you will then be called with results after review

## 2011-09-28 NOTE — Progress Notes (Signed)
  Subjective:    Patient ID: Grace Lyons, female    DOB: 1961-01-04, 51 y.o.   MRN: [id number]  Shortness of Breath This is a recurrent problem. The current episode started 1 to 4 weeks ago. The problem occurs intermittently. The problem has been waxing and waning. Pertinent negatives include no fever. Chest pain: lower right with cough. Leg swelling: no change. 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 Risk factors include no known risk factors. She has tried cool air, body position changes and prescription cough suppressants for the symptoms. The treatment provided mild relief. Her past medical history is significant for asthma (as child).    Past Medical History  Diagnosis Date  . VITAMIN D DEFICIENCY   . OBESITY   . DIABETES MELLITUS, TYPE II     diet controlled     Review of Systems  Constitutional: Negative for fever and fatigue.  Respiratory: Positive for cough (dry, worse at work), chest tightness and shortness of breath. Negative for choking.   Cardiovascular: Negative for palpitations. Chest pain: lower right with cough. Leg swelling: no change.       Objective:   Physical Exam BP 120/72  Pulse 89  Temp(Src) 99.2 F (37.3 C) (Oral)  Wt 201 lb (91.173 kg)  SpO2 97%  LMP 12/14/2010 Constitutional: She is overweight, no resting dyspnea, dry cough spasms with conversation HENT: Head: Normocephalic and atraumatic. Ears: B TMs ok, no erythema or effusion; Nose: Nose normal. Mouth/Throat: Oropharynx is clear and moist. No oropharyngeal exudate.  Eyes: Conjunctivae and EOM are normal. Pupils are equal, round, and reactive to light. No scleral icterus.  Neck: Normal range of motion. Neck supple. No JVD present. No thyromegaly present.  Cardiovascular: Normal rate, regular rhythm and normal heart sounds.  No murmur heard. No BLE edema. Pulmonary/Chest: Effort normal and breath sounds normal. No  respiratory distress. She has soft end exp wheezes.  Psychiatric: She has a normal mood and affect. Her behavior is normal. Judgment and thought content normal.   Lab Results  Component Value Date   WBC 6.7 12/29/2010   HGB 13.2 12/29/2010   HCT 38.5 12/29/2010   PLT 278.0 12/29/2010   GLUCOSE 101* 12/29/2010   CHOL 118 12/29/2010   TRIG 30.0 12/29/2010   HDL 56.00 12/29/2010   LDLCALC 56 12/29/2010   ALT 21 12/29/2010   AST 15 12/29/2010   NA 139 12/29/2010   K 4.9 12/29/2010   CL 105 12/29/2010   CREATININE 0.8 12/29/2010   BUN 12 12/29/2010   CO2 29 12/29/2010   TSH 0.46 12/29/2010   HGBA1C 6.5 12/29/2010   Dg Chest 2 View  09/07/2011  *RADIOLOGY REPORT*  Clinical Data: Cough.  Wheezing and congestion.  Former smoker.  CHEST - 2 VIEW  Comparison: 03/18/2010.  Findings: Mild basilar atelectasis is present.  Lung volumes are improved compared to the prior exam of 03/18/2010. Cardiopericardial silhouette and mediastinal contours are within normal limits.  There is no airspace disease.  No effusion is present.  IMPRESSION: No active cardiopulmonary disease.  Mild basilar atelectasis.  Original Report Authenticated By: Andreas Newport, M.D.       Assessment & Plan:  cough and dyspnea - hx childhood asthma - last chest x-ray without air space disease and 2 rounds of Z-Pak unimproved symptoms - suspect reactive airway disease. Treat with IM steroids today, albuterol MDI when necessary and schedule PFTs

## 2011-09-29 ENCOUNTER — Ambulatory Visit: Payer: Managed Care, Other (non HMO) | Admitting: Internal Medicine

## 2011-11-14 ENCOUNTER — Other Ambulatory Visit: Payer: Self-pay | Admitting: Internal Medicine

## 2011-11-14 DIAGNOSIS — Z1231 Encounter for screening mammogram for malignant neoplasm of breast: Secondary | ICD-10-CM

## 2012-01-04 ENCOUNTER — Ambulatory Visit (INDEPENDENT_AMBULATORY_CARE_PROVIDER_SITE_OTHER): Payer: Managed Care, Other (non HMO) | Admitting: Internal Medicine

## 2012-01-04 ENCOUNTER — Encounter: Payer: Self-pay | Admitting: Internal Medicine

## 2012-01-04 ENCOUNTER — Other Ambulatory Visit (INDEPENDENT_AMBULATORY_CARE_PROVIDER_SITE_OTHER): Payer: Managed Care, Other (non HMO)

## 2012-01-04 VITALS — BP 118/72 | HR 62 | Temp 98.4°F | Ht 62.0 in | Wt 198.0 lb

## 2012-01-04 DIAGNOSIS — Z0000 Encounter for general adult medical examination without abnormal findings: Secondary | ICD-10-CM

## 2012-01-04 DIAGNOSIS — E559 Vitamin D deficiency, unspecified: Secondary | ICD-10-CM

## 2012-01-04 DIAGNOSIS — R6889 Other general symptoms and signs: Secondary | ICD-10-CM

## 2012-01-04 DIAGNOSIS — E119 Type 2 diabetes mellitus without complications: Secondary | ICD-10-CM

## 2012-01-04 DIAGNOSIS — R7989 Other specified abnormal findings of blood chemistry: Secondary | ICD-10-CM

## 2012-01-04 DIAGNOSIS — B353 Tinea pedis: Secondary | ICD-10-CM

## 2012-01-04 LAB — HEPATIC FUNCTION PANEL
ALT: 25 U/L (ref 0–35)
AST: 19 U/L (ref 0–37)
Albumin: 4.3 g/dL (ref 3.5–5.2)
Alkaline Phosphatase: 55 U/L (ref 39–117)
Bilirubin, Direct: 0.1 mg/dL (ref 0.0–0.3)
Total Bilirubin: 0.9 mg/dL (ref 0.3–1.2)
Total Protein: 7.4 g/dL (ref 6.0–8.3)

## 2012-01-04 LAB — CBC WITH DIFFERENTIAL/PLATELET
Basophils Absolute: 0 10*3/uL (ref 0.0–0.1)
Basophils Relative: 0.5 % (ref 0.0–3.0)
Eosinophils Absolute: 0.1 10*3/uL (ref 0.0–0.7)
Eosinophils Relative: 2 % (ref 0.0–5.0)
HCT: 38.5 % (ref 36.0–46.0)
Hemoglobin: 12.7 g/dL (ref 12.0–15.0)
Lymphocytes Relative: 28 % (ref 12.0–46.0)
Lymphs Abs: 1.7 10*3/uL (ref 0.7–4.0)
MCHC: 33 g/dL (ref 30.0–36.0)
MCV: 95.9 fl (ref 78.0–100.0)
Monocytes Absolute: 0.6 10*3/uL (ref 0.1–1.0)
Monocytes Relative: 9.2 % (ref 3.0–12.0)
Neutro Abs: 3.6 10*3/uL (ref 1.4–7.7)
Neutrophils Relative %: 60.3 % (ref 43.0–77.0)
Platelets: 243 10*3/uL (ref 150.0–400.0)
RBC: 4.01 Mil/uL (ref 3.87–5.11)
RDW: 14.6 % (ref 11.5–14.6)
WBC: 6 10*3/uL (ref 4.5–10.5)

## 2012-01-04 LAB — HEMOGLOBIN A1C: Hgb A1c MFr Bld: 6.5 % (ref 4.6–6.5)

## 2012-01-04 LAB — LIPID PANEL
Cholesterol: 127 mg/dL (ref 0–200)
HDL: 58.2 mg/dL (ref >39.00)
LDL Cholesterol: 62 mg/dL (ref 0–99)
Total CHOL/HDL Ratio: 2
Triglycerides: 35 mg/dL (ref 0.0–149.0)
VLDL: 7 mg/dL (ref 0.0–40.0)

## 2012-01-04 LAB — BASIC METABOLIC PANEL
BUN: 9 mg/dL (ref 6–23)
CO2: 28 mEq/L (ref 19–32)
Calcium: 10 mg/dL (ref 8.4–10.5)
Chloride: 103 mEq/L (ref 96–112)
Creatinine, Ser: 0.7 mg/dL (ref 0.4–1.2)
GFR: 123.41 mL/min (ref >60.00)
Glucose, Bld: 90 mg/dL (ref 70–99)
Potassium: 3.6 mEq/L (ref 3.5–5.1)
Sodium: 138 mEq/L (ref 135–145)

## 2012-01-04 LAB — TSH: TSH: 0.28 u[IU]/mL — ABNORMAL LOW (ref 0.35–5.50)

## 2012-01-04 MED ORDER — KETOCONAZOLE 2 % EX CREA: TOPICAL_CREAM | Freq: Two times a day (BID) | CUTANEOUS | Status: DC

## 2012-01-04 NOTE — Progress Notes (Signed)
Addended by: Rene Paci A on: 01/04/2012 05:39 PM   Modules accepted: Orders

## 2012-01-04 NOTE — Patient Instructions (Addendum)
It was good to see you today. Health Maintenance reviewed - all recommended immunizations and age-appropriate screenings are up-to-date.  Test(s) ordered today. Your results will be called to you after review (48-72hours after test completion). If any changes need to be made, you will be notified at that time. Use tea tree oil to fungus toenail, use over the counter wart treatment pads to your palm wart and use prescription fungus cream to your foot skin Your prescription(s) have been submitted to your pharmacy. Please take as directed and contact our office if you believe you are having problem(s) with the medication(s). If these problems do not improved, call for other treatment options Other Medications reviewed, no changes at this time.  Please schedule followup in 6-12 months, call sooner if problems.  Athlete's Foot  Athlete's foot is a skin infection caused by a fungus. Athlete's foot is often seen between or under the toes. It can also be seen on the bottom of the foot. Athlete's foot can spread to other people by sharing towels or shower stalls. HOME CARE  Only take medicines as told by your doctor. Do not use steroid creams.   Wash your feet daily. Dry your feet well, especially between the toes.   Change your socks every day. Wear cotton or wool socks.   Change your socks 2 to 3 times a day in hot weather.   Wear sandals or canvas tennis shoes with good airflow.   If you have blisters, soak your feet in a solution as told by your doctor. Do this for 20 to 30 minutes, 2 times a day. Dry your feet well after you soak them.   Do not share towels.   Wear sandals when you use shared locker rooms or showers.  GET HELP RIGHT AWAY IF:    You have a fever.   Your foot is puffy (swollen), sore, warm, or red.   You are not getting better after 7 days of treatment.   You still have athlete's foot after 30 days.   You have problems caused by your medicine.  MAKE SURE YOU:     Understand these instructions.   Will watch your condition.   Will get help right away if you are not doing well or get worse.  Document Released: 01/18/2008 Document Revised: 07/21/2011 Document Reviewed: 05/20/2011 [facility] Patient Information 2012 [place], [place]. Ringworm, Nail A fungal infection of the nail (tinea unguium/onychomycosis) is common. It is common as the visible part of the nail is composed of dead cells which have no blood supply to help prevent infection. It occurs because fungi are everywhere and will pick any opportunity to grow on any dead material. Because nails are very slow growing they require up to 2 years of treatment with anti-fungal medications. The entire nail back to the base is infected. This includes approximately ? of the nail which you cannot see. If your caregiver has prescribed a medication by mouth, take it every day and as directed. No progress will be seen for at least 6 to 9 months. Do not be disappointed! Because fungi live on dead cells with little or no exposure to blood supply, medication delivery to the infection is slow; thus the cure is slow. It is also why you can observe no progress in the first 6 months. The nail becoming cured is the base of the nail, as it has the blood supply. Topical medication such as creams and ointments are usually not effective. Important in successful treatment of  nail fungus is closely following the medication regimen that your doctor prescribes. Sometimes you and your caregiver may elect to speed up this process by surgical removal of all the nails. Even this may still require 6 to 9 months of additional oral medications. See your caregiver as directed. Remember there will be no visible improvement for at least 6 months. See your caregiver sooner if other signs of infection (redness and swelling) develop. Document Released: 07/29/2000 Document Revised: 07/21/2011 Document Reviewed: 10/07/2008 [facility] Patient  Information 2012 [place], [place]. Warts  Warts are common. They are caused by a virus. Warts are most common in older children. There may be a single wart or there may be many warts. The size and location varies. They can be spread by scratching the wart and then scratching normal skin. Most warts will disappear over many months to a couple years. HOME CARE  Follow home care directions as told by your doctor.   Keep all doctor visits as told. Warts may return.  GET HELP RIGHT AWAY IF: The treated skin becomes red, puffy (swollen), or painful. MAKE SURE YOU:  Understand these instructions.   Will watch your condition.   Will get help right away if you are not doing well or get worse.  Document Released: 12/02/2010 Document Revised: 07/21/2011 Document Reviewed: 12/02/2010 [facility] Patient Information 2012 [place], [place].
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
## 2012-01-04 NOTE — Assessment & Plan Note (Signed)
diet controlled - check a1c now - continue to work on weight, exercise and diet Lab Results  Component Value Date   HGBA1C 6.5 12/29/2010

## 2012-01-04 NOTE — Assessment & Plan Note (Signed)
Check level now - continue oral replacement 

## 2012-01-05 ENCOUNTER — Ambulatory Visit
Admission: RE | Admit: 2012-01-05 | Discharge: 2012-01-05 | Disposition: A | Discharge status: Home / Self Care | Payer: Managed Care, Other (non HMO) | Source: Ambulatory Visit | Attending: Internal Medicine | Admitting: Internal Medicine

## 2012-01-05 DIAGNOSIS — Z1231 Encounter for screening mammogram for malignant neoplasm of breast: Secondary | ICD-10-CM

## 2012-01-05 LAB — VITAMIN D 25 HYDROXY (VIT D DEFICIENCY, FRACTURES): Vit D, 25-Hydroxy: 23 ng/mL — ABNORMAL LOW (ref 30–89)

## 2012-01-06 ENCOUNTER — Telehealth: Payer: Self-pay | Admitting: Internal Medicine

## 2012-01-06 NOTE — Telephone Encounter (Signed)
Pt advised of lab result and has made appt with [facility].

## 2012-01-06 NOTE — Telephone Encounter (Signed)
Left message on voicemail for patient to return call when available, reason for call was to advise on labs:    TSH is low which indicates pt may have overactive thyroid - will refer to endo ([facility]) to further eval and tx same Other CPX labs ok - still with borderline DM but no med tx needed

## 2012-01-06 NOTE — Telephone Encounter (Signed)
Pt desire lab result call-  tsh lab results ---

## 2012-01-17 ENCOUNTER — Ambulatory Visit (INDEPENDENT_AMBULATORY_CARE_PROVIDER_SITE_OTHER): Payer: Managed Care, Other (non HMO) | Admitting: Endocrinology

## 2012-01-17 ENCOUNTER — Encounter: Payer: Self-pay | Admitting: Endocrinology

## 2012-01-17 ENCOUNTER — Ambulatory Visit: Payer: Managed Care, Other (non HMO) | Admitting: Endocrinology

## 2012-01-17 VITALS — BP 118/72 | HR 70 | Temp 98.3°F | Ht 62.0 in | Wt 196.0 lb

## 2012-01-17 DIAGNOSIS — R7989 Other specified abnormal findings of blood chemistry: Secondary | ICD-10-CM

## 2012-01-17 DIAGNOSIS — E039 Hypothyroidism, unspecified: Secondary | ICD-10-CM

## 2012-01-17 DIAGNOSIS — H538 Other visual disturbances: Secondary | ICD-10-CM

## 2012-01-17 DIAGNOSIS — R197 Diarrhea, unspecified: Secondary | ICD-10-CM

## 2012-01-17 NOTE — Patient Instructions (Addendum)
Please redo the thyroid blood test in 1 month. If it is still slightly abnormal, let's check the ultrasound. If it were to be more abnormal, we should instead check a nuclear medicine "scan."

## 2012-01-17 NOTE — Progress Notes (Signed)
Subjective:    Patient ID: Grace Lyons, female    DOB: 08-27-1960, 51 y.o.   MRN: [id number]  HPI Pt was noted on recent labs to have abnormal TSH.  She is unaware of ever having had a thyroid problem before.  Pt states many years of slight blurry vision from both eyes, in the context of surgery to correct nearsightedness, but no assoc tearing. Past Medical History  Diagnosis Date  . VITAMIN D DEFICIENCY   . OBESITY   . DIABETES MELLITUS, TYPE II     diet controlled    Past Surgical History  Procedure Date  . No past surgeries     History   Social History  . Marital Status: Single    Spouse Name: N/A    Number of Children: N/A  . Years of Education: N/A   Occupational History  . Not on file.   Social History Main Topics  . Smoking status: Former Smoker    Quit date: 01/12/1996  . Smokeless tobacco: Not on file   Comment: Single, lives alone. 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 Alcohol Use: No  . Drug Use: No  . Sexually Active: Not on file   Other Topics Concern  . Not on file   Social History Narrative  . No narrative on file    Current Outpatient Prescriptions on File Prior to Visit  Medication Sig Dispense Refill  . Biotin 5 MG TABS Take by mouth daily.        . calcium-vitamin D (OSCAL WITH D) 500-200 MG-UNIT per tablet Take 1 tablet by mouth 2 (two) times daily.        . Cholecalciferol (VITAMIN D3) 1000 UNITS CAPS Take by mouth daily.        Marland Kitchen ketoconazole (NIZORAL) 2 % cream Apply topically 2 (two) times daily.  30 g  0  . vitamin E 200 UNIT capsule Take 200 Units by mouth daily.          No Known Allergies  Family History  Problem Relation Age of Onset  . Diabetes Mother   . Diabetes Father   . Diabetes Other   no goiter or other thyroid problem  LMP 12/14/2010  Review of Systems denies weight loss, headache, hoarseness, double vision, palpitations, sob, polyuria, myalgias, excessive  diaphoresis, numbness, tremor, anxiety, neck pain, menopausal sxs, and rhinorrhea.  She has intermittent diarrhea and easy bruising.    Objective:   Physical Exam VS: see vs page GEN: no distress HEAD: head: no deformity eyes: no periorbital swelling, no proptosis external nose and ears are normal mouth: no lesion seen NECK: supple, thyroid is not enlarged CHEST WALL: no deformity LUNGS:  Clear to auscultation CV: reg rate and rhythm, no murmur ABD: abdomen is soft, nontender.  no hepatosplenomegaly.  not distended.  no hernia.   MUSCULOSKELETAL: muscle bulk and strength are grossly normal.  no obvious joint swelling.  gait is normal and steady EXTEMITIES: no deformity.  no edema NEURO:  cn 2-12 grossly intact.   readily moves all 4's.  sensation is intact to touch on the feet SKIN:  Normal texture and temperature.  No rash or suspicious lesion is visible.   NODES:  None palpable at the neck PSYCH: alert, oriented x3.  Does not appear anxious nor depressed.  Lab Results  Component Value Date   TSH 0.28* 01/04/2012      Assessment & Plan:  Mild hyperthyroidism, uncertain etiology Blurry vision.  Not thyroid-related Diarrhea, not thyroid-related

## 2012-01-18 ENCOUNTER — Telehealth: Payer: Self-pay | Admitting: Obstetrics and Gynecology

## 2012-01-19 ENCOUNTER — Ambulatory Visit: Payer: Self-pay | Admitting: Obstetrics and Gynecology

## 2012-01-19 ENCOUNTER — Telehealth: Payer: Self-pay | Admitting: Obstetrics and Gynecology

## 2012-01-19 NOTE — Telephone Encounter (Signed)
Triage/follow up from yest. Call.

## 2012-02-21 ENCOUNTER — Ambulatory Visit (INDEPENDENT_AMBULATORY_CARE_PROVIDER_SITE_OTHER): Payer: Managed Care, Other (non HMO) | Admitting: Obstetrics and Gynecology

## 2012-02-21 ENCOUNTER — Encounter: Payer: Self-pay | Admitting: Obstetrics and Gynecology

## 2012-02-21 VITALS — BP 110/80 | HR 72 | Resp 16 | Ht 62.0 in | Wt 198.0 lb

## 2012-02-21 DIAGNOSIS — R6889 Other general symptoms and signs: Secondary | ICD-10-CM

## 2012-02-21 DIAGNOSIS — Z124 Encounter for screening for malignant neoplasm of cervix: Secondary | ICD-10-CM

## 2012-02-21 DIAGNOSIS — N950 Postmenopausal bleeding: Secondary | ICD-10-CM

## 2012-02-21 DIAGNOSIS — Z01419 Encounter for gynecological examination (general) (routine) without abnormal findings: Secondary | ICD-10-CM

## 2012-02-21 DIAGNOSIS — Z139 Encounter for screening, unspecified: Secondary | ICD-10-CM

## 2012-02-21 DIAGNOSIS — R7989 Other specified abnormal findings of blood chemistry: Secondary | ICD-10-CM

## 2012-02-21 LAB — CBC
HCT: 40.2 % (ref 36.0–46.0)
Hemoglobin: 13.3 g/dL (ref 12.0–15.0)
MCH: 30.6 pg (ref 26.0–34.0)
MCHC: 33.1 g/dL (ref 30.0–36.0)
MCV: 92.6 fL (ref 78.0–100.0)
Platelets: 292 10*3/uL (ref 150–400)
RBC: 4.34 MIL/uL (ref 3.87–5.11)
RDW: 13.7 % (ref 11.5–15.5)
WBC: 6.3 10*3/uL (ref 4.0–10.5)

## 2012-02-21 MED ORDER — CLOBETASOL PROPIONATE 0.05 % EX CREA: TOPICAL_CREAM | Freq: Two times a day (BID) | CUTANEOUS | Status: DC

## 2012-02-21 NOTE — Progress Notes (Signed)
Contraception BTL Last pap 01/12/2011 normal Last Mammo 12/2011 normal Last Colonoscopy 2012 normal Last Dexa Scan none Primary MD [place] Brick Abuse at Home none  C/o no menses for a year and ;then had a cycle for 3 days.  Changed tampon once.  Filed Vitals:   02/21/12 1543  BP: 110/80  Pulse: 72  Resp: 16   ROS: noncontributory  Physical Examination: General appearance - alert, well appearing, and in no distress Neck - supple, no significant adenopathy Chest - clear to auscultation, no wheezes, rales or rhonchi, symmetric air entry Heart - normal rate and regular rhythm Abdomen - soft, nontender, nondistended, no masses or organomegaly Breasts - breasts appear normal, no suspicious masses, no skin or nipple changes or axillary nodes Pelvic - normal external genitalia, vulva, vagina, cervix, uterus and adnexa Back exam - no CVAT Extremities - no edema, redness or tenderness in the calves or thighs  A/P ?PMB - labs today - can be related to abnormal thyroid fxn - PT recently referred to Dr. Everardo All secondary to abnl TSH - will copy labs and note to Dr. Sharalyn Ink u/s and embx Pap today Pt had mammo and colonoscopy Pt request clobetasol Rx for rash in bil inner thighs ("heat rash") - ERx and if no relief, see derm

## 2012-02-22 LAB — VITAMIN D 25 HYDROXY (VIT D DEFICIENCY, FRACTURES): Vit D, 25-Hydroxy: 24 ng/mL — ABNORMAL LOW (ref 30–89)

## 2012-02-22 LAB — FOLLICLE STIMULATING HORMONE: FSH: 59 m[IU]/mL

## 2012-02-22 LAB — T4, FREE: Free T4: 1.39 ng/dL (ref 0.80–1.80)

## 2012-02-22 LAB — TSH: TSH: 0.373 u[IU]/mL (ref 0.350–4.500)

## 2012-02-23 LAB — PAP IG W/ RFLX HPV ASCU

## 2012-02-24 LAB — HUMAN PAPILLOMAVIRUS, HIGH RISK: HPV DNA High Risk: NOT DETECTED

## 2012-02-27 ENCOUNTER — Encounter: Payer: Self-pay | Admitting: Obstetrics and Gynecology

## 2012-02-29 ENCOUNTER — Telehealth: Payer: Self-pay

## 2012-02-29 ENCOUNTER — Telehealth: Payer: Self-pay | Admitting: Internal Medicine

## 2012-02-29 DIAGNOSIS — E559 Vitamin D deficiency, unspecified: Secondary | ICD-10-CM

## 2012-02-29 NOTE — Telephone Encounter (Signed)
Marylene Land calling because she would like for a copy of her abnormal pap smear results from Dr. Marylene Land Robert's office to be sent to [facility] for Dr. Felicity Coyer.  States OB/GYN told her that Primary Care Office would have to request the copy.

## 2012-02-29 NOTE — Telephone Encounter (Signed)
Pt was called and given Vit-D protocol. Rx was called in to [facility] [address] for Vit-D softgels 50,000 units 1 capsule 1x wk for 12 wks #20 0 RF. Future order done. Pt was advised to keep all standing appts and letter will be mailed regarding pap results. Mathis Bud

## 2012-02-29 NOTE — Telephone Encounter (Signed)
Results available per EPIC, pt informed of same.

## 2012-03-01 ENCOUNTER — Encounter: Payer: Self-pay | Admitting: Obstetrics and Gynecology

## 2012-03-20 ENCOUNTER — Ambulatory Visit (INDEPENDENT_AMBULATORY_CARE_PROVIDER_SITE_OTHER): Payer: Managed Care, Other (non HMO) | Admitting: Obstetrics and Gynecology

## 2012-03-20 ENCOUNTER — Ambulatory Visit (INDEPENDENT_AMBULATORY_CARE_PROVIDER_SITE_OTHER): Payer: Managed Care, Other (non HMO)

## 2012-03-20 ENCOUNTER — Other Ambulatory Visit: Payer: Self-pay | Admitting: Obstetrics and Gynecology

## 2012-03-20 ENCOUNTER — Encounter: Payer: Self-pay | Admitting: Obstetrics and Gynecology

## 2012-03-20 VITALS — BP 110/76 | Resp 16 | Ht 62.0 in | Wt 196.0 lb

## 2012-03-20 DIAGNOSIS — N950 Postmenopausal bleeding: Secondary | ICD-10-CM

## 2012-03-20 LAB — POCT URINE PREGNANCY: Preg Test, Ur: NEGATIVE

## 2012-03-20 NOTE — Progress Notes (Signed)
Ultrasound today uterus 7.8 x 3.4 x 4.4 cm Several fibroids one at the uterine fundus measuring 4 cm a posterior left subserosal measuring 1.5 cm a midline intramural measuring 1.6 cm normal bilateral ovaries  No further bleeding  Filed Vitals:   03/20/12 1615  BP: 110/76  Resp: 16   Em Bx performed per protocol without difficulty Pipelle passed x 3 to 8cm  Results for orders placed in visit on 03/20/12  POCT URINE PREGNANCY      Component Value Range   Preg Test, Ur Negative       A/P RTO for AEX in 36yr If em bx abnl will notify pt

## 2012-03-22 LAB — PATHOLOGY

## 2012-05-07 ENCOUNTER — Telehealth: Payer: Self-pay | Admitting: Obstetrics and Gynecology

## 2012-05-07 NOTE — Telephone Encounter (Signed)
Tc to pt regarding msg.  Lm on vm to call back. 

## 2012-05-08 ENCOUNTER — Telehealth: Payer: Self-pay

## 2012-05-08 NOTE — Telephone Encounter (Signed)
Tc to pt, states never received call about Enbx results.  Pt informed no malignancy, no hyperplasia, etc.  Pt states 2 wks ago started with bldg and again 2 days ago, pt is menopausal, wants to know what AR recommends, advised will consult w/ AR assistant and will get call back about recommendation, pt voices agreement.

## 2012-05-08 NOTE — Telephone Encounter (Signed)
Spoke to pt re: problem that she's having with continued PMB. EBX was normal. She was offered re-eval in the office on Oct. 1st @ 2pm. Pt will look at her calender and get back to me about if she can come that day. Laetitia Comas A

## 2012-05-09 ENCOUNTER — Telehealth: Payer: Self-pay

## 2012-05-09 NOTE — Telephone Encounter (Signed)
Spoke to pt to get her sched. For follow up. Pt is having abnl bleeding. Alvino Chapel

## 2012-05-23 ENCOUNTER — Encounter: Payer: Managed Care, Other (non HMO) | Admitting: Obstetrics and Gynecology

## 2012-05-24 ENCOUNTER — Telehealth: Payer: Self-pay | Admitting: Obstetrics and Gynecology

## 2012-05-24 NOTE — Telephone Encounter (Signed)
AR pt 

## 2012-05-24 NOTE — Telephone Encounter (Signed)
Spoke to pt who states she wants to see if there is a pill she can take that will get her irreg bleeding stopped. She has been having bleeding off and on since she was in here for the EBX. She was scheduled for appt yesterday, but had cancelled that previously because she wanted to wait til after her vacation  Oct 19th -26th to be seen. She is scheduled for Oct. 30th. After reviewing details of her case with AR, I let pt know that Dr. Su Hilt does not want to prescribe anything hormonal to her to stop the bleeding without seeing her in the office and discussing risks,benefits and alternatives. Pt was offered a sooner appt, but declined it, stating she will just deal with the irreg. Bleeding through her vacation. She will keep her appt on the 30th Oct. Levin Erp

## 2012-06-13 ENCOUNTER — Ambulatory Visit (INDEPENDENT_AMBULATORY_CARE_PROVIDER_SITE_OTHER): Payer: BC Managed Care – PPO | Admitting: Obstetrics and Gynecology

## 2012-06-13 ENCOUNTER — Encounter: Payer: Self-pay | Admitting: Obstetrics and Gynecology

## 2012-06-13 VITALS — BP 130/78 | Resp 16 | Ht 62.0 in | Wt 200.0 lb

## 2012-06-13 DIAGNOSIS — Z975 Presence of (intrauterine) contraceptive device: Secondary | ICD-10-CM

## 2012-06-13 DIAGNOSIS — N926 Irregular menstruation, unspecified: Secondary | ICD-10-CM

## 2012-06-13 LAB — POCT URINE PREGNANCY: Preg Test, Ur: NEGATIVE

## 2012-06-13 NOTE — Addendum Note (Signed)
Addended by: Mathis Bud on: 06/13/2012 05:21 PM   Modules accepted: Orders

## 2012-06-13 NOTE — Progress Notes (Signed)
Here secondary to abnl bleeding  Filed Vitals:   06/13/12 1631  BP: 130/78  Resp: 16   ROS: noncontributory  Pelvic exam:  VULVA: normal appearing vulva with no masses, tenderness or lesions,  VAGINA: normal appearing vagina with normal color and discharge, no lesions, CERVIX: normal appearing cervix without discharge or lesions,  UTERUS: uterus is normal size, shape, consistency and nontender,  ADNEXA: normal adnexa in size, nontender and no masses.  A/P DUB with neg embx and continued bleeding issues Options reviewed  Pt agreeable to proceed with mirena SE discussed GC/CT today with consent pre IUD After further discussion with patient she swears she went for over a yr with absolutely no bleeding so this is really PMB.  She aslo had FSH of 59 on prior visit. So with that in mind I rec hyst/D&C and then tx options (and possibly still can consider Mirena but will revisit after procedure)

## 2012-06-15 LAB — GC/CHLAMYDIA PROBE AMP
CT Probe RNA: NEGATIVE
GC Probe RNA: NEGATIVE

## 2012-07-06 ENCOUNTER — Telehealth: Payer: Self-pay | Admitting: Obstetrics and Gynecology

## 2012-07-10 ENCOUNTER — Other Ambulatory Visit: Payer: Self-pay | Admitting: Obstetrics and Gynecology

## 2012-07-10 ENCOUNTER — Telehealth: Payer: Self-pay | Admitting: Obstetrics and Gynecology

## 2012-07-10 NOTE — Telephone Encounter (Signed)
Hysteroscopy D&C scheduled for 08/31/12 @ 11:30 with AR.  BCBS effective 05/15/12.  Plan pays 70/30 after a $2,500 deductible. Pre-op due $117.98. -Adrianne Pridgen

## 2012-08-23 ENCOUNTER — Encounter: Payer: Self-pay | Admitting: Obstetrics and Gynecology

## 2012-08-23 ENCOUNTER — Other Ambulatory Visit: Payer: Self-pay | Admitting: Obstetrics and Gynecology

## 2012-08-24 ENCOUNTER — Ambulatory Visit (INDEPENDENT_AMBULATORY_CARE_PROVIDER_SITE_OTHER): Payer: BC Managed Care – PPO | Admitting: Obstetrics and Gynecology

## 2012-08-24 ENCOUNTER — Encounter: Payer: Self-pay | Admitting: Obstetrics and Gynecology

## 2012-08-24 VITALS — BP 128/80 | HR 80 | Temp 98.0°F | Resp 16 | Ht 62.0 in | Wt 205.0 lb

## 2012-08-24 DIAGNOSIS — N950 Postmenopausal bleeding: Secondary | ICD-10-CM

## 2012-08-24 DIAGNOSIS — Z01818 Encounter for other preprocedural examination: Secondary | ICD-10-CM

## 2012-08-24 DIAGNOSIS — D219 Benign neoplasm of connective and other soft tissue, unspecified: Secondary | ICD-10-CM

## 2012-08-24 DIAGNOSIS — D259 Leiomyoma of uterus, unspecified: Secondary | ICD-10-CM

## 2012-08-24 NOTE — Progress Notes (Signed)
Grace Lyons is a 52 y.o. female G1P0 who presents for hysteroscopy with dilatation and curettage because of post menopausal bleeding.  After going more than twelve months without any vaginal bleeding, patient began to bleed in May 2013.  Initially she would have a five day flow with tampon change four times a day but denied any cramps. As months passed the frequency of her bleeding increased until in September it was constant. A pelvic ultrasound showed uteurs-7.8 x 3.4 x 4.4 cm with several fibroids: 4 cm fundal, 1.5 cm left subserosal and 1.6 cm midline intramural with normal appearing ovaries.  An endometrial biopsy CBC and TSH were all normal.  Patient denies any vaginitis symptoms, changes in bowel or bladder function.  She was given both medical and surgical management options for her symptoms and would like a Mirena IUD.  She has decided however, given that she is post menopausal (FSH = 59) she will first undergo hysteroscopy with D & C.   Past Medical History  OB History: G1P0 SVD 1995  GYN History: menarche : 52 YO;  LMP: 08/14/12;    Contracepton bilateral tubal ligation  The patient denies history of sexually transmitted disease.   Last PAP smear: 02/2012 ASCUS with negative HPV  Medical History: Diabetes mellitus, vitamin D deficiency, fibroids, cervical polyps, left rotator cuff injury, left knee pain, stress urinary incontinence  Surgical History:  1995 Tubal Sterilization;  2004 Lasik Eye Surgery Denies history of blood transfusions but has severe nausea and vomiting with anesthesia used during dental procedure.  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; Denies alcohol, tobacco or illicit drug use.   Outpatient Encounter Prescriptions as of 08/24/2012  Medication Sig Dispense Refill  . Cholecalciferol (VITAMIN D3) 1000 UNITS CAPS Take by mouth daily.        . [DISCONTINUED] Biotin 5  MG TABS Take by mouth daily.        . ketoconazole (NIZORAL) 2 % cream Apply topically 2 (two) times daily.  30 g  0  . vitamin E 200 UNIT capsule Take 200 Units by mouth daily.        . [DISCONTINUED] calcium-vitamin D (OSCAL WITH D) 500-200 MG-UNIT per tablet Take 1 tablet by mouth 2 (two) times daily.        . [DISCONTINUED] clobetasol cream (TEMOVATE) 0.05 % Apply topically 2 (two) times daily. For 5 days  30 g  0    No Known Drug Allergies  Denies sensitivity to peanuts, soy, latex or adhesives. Patient does not eat seafood so doesn't know if she has a sensitivity.  ROS: Admits to random urine leakage with cough;  Denies headache, vision changes, nasal congestion, dysphagia, tinnitus, dizziness, hoarseness, cough,  chest pain, shortness of breath, nausea, vomiting, diarrhea,constipation,  urinary frequency, urgency  dysuria, hematuria, vaginitis symptoms, pelvic pain, swelling of joints,easy bruising,  myalgias, arthralgias, skin rashes, unexplained weight loss and except as is mentioned in the history of present illness, patient's review of systems is otherwise negative.  Physical Exam    BP 128/80  Pulse 80  Temp 98 F (36.7 C) (Oral)  Resp 16  Ht 5' 2" (1.575 m)  Wt 205 lb (92.987 kg)  BMI 37.49 kg/m2  LMP 08/14/2012  Neck: supple without masses or thyromegaly Lungs: clear to auscultation Heart: regular rate and rhythm Abdomen: soft, non-tender and no organomegaly Pelvic:EGBUS- wnl; vagina-normal with scant blood; uterus-normal size,   cervix without lesions or motion tenderness; adnexae-no tenderness or masses Extremities:  no clubbing, cyanosis or edema   Assesment:  Post Menopausal Bleeding                       Uterine Fibroids   Disposition:  A discussion was held with patient regarding the indication for her procedure(s) along with the risks, which include but are not limited to: reaction to anesthesia, damage to adjacent organs, infection and excessive bleeding.  Patient verbalized understanding of these risks and has consented to proceed with Hysteroscopy with Dilatation and Curettage at [facility], August 31, 2012 at 11:30 a.m.   CSN# [id number]   Axie Hayne J. Arcelia Pals, PA-C  for Dr. Angela Y. Roberts   

## 2012-08-31 ENCOUNTER — Ambulatory Visit (HOSPITAL_COMMUNITY): Payer: BC Managed Care – PPO

## 2012-08-31 ENCOUNTER — Encounter (HOSPITAL_COMMUNITY)
Admission: RE | Disposition: A | Discharge status: Home / Self Care | Payer: Self-pay | Source: Ambulatory Visit | Attending: Obstetrics and Gynecology

## 2012-08-31 ENCOUNTER — Encounter (HOSPITAL_COMMUNITY): Payer: Self-pay

## 2012-08-31 ENCOUNTER — Encounter (HOSPITAL_COMMUNITY): Payer: Self-pay | Admitting: *Deleted

## 2012-08-31 ENCOUNTER — Ambulatory Visit (HOSPITAL_COMMUNITY)
Admission: RE | Admit: 2012-08-31 | Discharge: 2012-08-31 | Disposition: A | Discharge status: Home / Self Care | Payer: BC Managed Care – PPO | Source: Ambulatory Visit | Attending: Obstetrics and Gynecology | Admitting: Obstetrics and Gynecology

## 2012-08-31 DIAGNOSIS — N840 Polyp of corpus uteri: Secondary | ICD-10-CM | POA: Insufficient documentation

## 2012-08-31 DIAGNOSIS — D250 Submucous leiomyoma of uterus: Secondary | ICD-10-CM | POA: Insufficient documentation

## 2012-08-31 DIAGNOSIS — N950 Postmenopausal bleeding: Secondary | ICD-10-CM | POA: Insufficient documentation

## 2012-08-31 HISTORY — PX: HYSTEROSCOPY W/D&C: SHX1775

## 2012-08-31 HISTORY — PX: HYSTEROSCOPY WITH D & C: SHX1775

## 2012-08-31 LAB — CBC
HCT: 40.1 % (ref 36.0–46.0)
Hemoglobin: 13.8 g/dL (ref 12.0–15.0)
MCH: 31.5 pg (ref 26.0–34.0)
MCHC: 34.4 g/dL (ref 30.0–36.0)
MCV: 91.6 fL (ref 78.0–100.0)
Platelets: 273 10*3/uL (ref 150–400)
RBC: 4.38 MIL/uL (ref 3.87–5.11)
RDW: 14.2 % (ref 11.5–15.5)
WBC: 5.2 10*3/uL (ref 4.0–10.5)

## 2012-08-31 LAB — HCG, SERUM, QUALITATIVE: Preg, Serum: NEGATIVE

## 2012-08-31 SURGERY — DILATATION AND CURETTAGE /HYSTEROSCOPY
Anesthesia: Monitor Anesthesia Care | Site: Uterus | Wound class: Clean Contaminated

## 2012-08-31 MED ORDER — PROPOFOL INFUSION 10 MG/ML OPTIME
INTRAVENOUS | Status: DC | PRN
  Administered 2012-08-31: 50 ug/kg/min via INTRAVENOUS

## 2012-08-31 MED ORDER — LIDOCAINE IN DEXTROSE 5-7.5 % IV SOLN
INTRAVENOUS | Status: DC | PRN
  Administered 2012-08-31: 1 mL via INTRATHECAL

## 2012-08-31 MED ORDER — SCOPOLAMINE 1 MG/3DAYS TD PT72
1.0000 | MEDICATED_PATCH | TRANSDERMAL | Status: DC
  Administered 2012-08-31: 1.5 mg via TRANSDERMAL

## 2012-08-31 MED ORDER — PROPOFOL 10 MG/ML IV EMUL
INTRAVENOUS | Status: AC
  Filled 2012-08-31: qty 20

## 2012-08-31 MED ORDER — FENTANYL CITRATE 0.05 MG/ML IJ SOLN
INTRAMUSCULAR | Status: AC
  Administered 2012-08-31: 50 ug via INTRAVENOUS
  Filled 2012-08-31: qty 2

## 2012-08-31 MED ORDER — ONDANSETRON HCL 4 MG/2ML IJ SOLN
INTRAMUSCULAR | Status: AC
  Filled 2012-08-31: qty 2

## 2012-08-31 MED ORDER — HYDROCODONE-ACETAMINOPHEN 5-500 MG PO TABS: 1.0000 | ORAL_TABLET | Freq: Four times a day (QID) | ORAL | Status: DC | PRN

## 2012-08-31 MED ORDER — LACTATED RINGERS IV SOLN
INTRAVENOUS | Status: DC
  Administered 2012-08-31 (×2): via INTRAVENOUS

## 2012-08-31 MED ORDER — GLYCINE 1.5 % IR SOLN
Status: DC | PRN
  Administered 2012-08-31: 3000 mL

## 2012-08-31 MED ORDER — FENTANYL CITRATE 0.05 MG/ML IJ SOLN
INTRAMUSCULAR | Status: AC
  Filled 2012-08-31: qty 2

## 2012-08-31 MED ORDER — GLYCOPYRROLATE 0.2 MG/ML IJ SOLN
INTRAMUSCULAR | Status: AC
  Filled 2012-08-31: qty 1

## 2012-08-31 MED ORDER — DEXAMETHASONE SODIUM PHOSPHATE 10 MG/ML IJ SOLN
INTRAMUSCULAR | Status: DC | PRN
  Administered 2012-08-31: 10 mg via INTRAVENOUS

## 2012-08-31 MED ORDER — FENTANYL CITRATE 0.05 MG/ML IJ SOLN
25.0000 ug | INTRAMUSCULAR | Status: DC | PRN
  Administered 2012-08-31: 50 ug via INTRAVENOUS

## 2012-08-31 MED ORDER — SCOPOLAMINE 1 MG/3DAYS TD PT72
MEDICATED_PATCH | TRANSDERMAL | Status: AC
  Administered 2012-08-31: 1.5 mg via TRANSDERMAL
  Filled 2012-08-31: qty 1

## 2012-08-31 MED ORDER — FENTANYL CITRATE 0.05 MG/ML IJ SOLN
INTRAMUSCULAR | Status: DC | PRN
  Administered 2012-08-31: 50 ug via INTRAVENOUS
  Administered 2012-08-31: 100 ug via INTRAVENOUS
  Administered 2012-08-31: 50 ug via INTRAVENOUS

## 2012-08-31 MED ORDER — IBUPROFEN 600 MG PO TABS: 600.0000 mg | ORAL_TABLET | Freq: Four times a day (QID) | ORAL | Status: DC | PRN

## 2012-08-31 MED ORDER — MIDAZOLAM HCL 5 MG/5ML IJ SOLN
INTRAMUSCULAR | Status: DC | PRN
  Administered 2012-08-31: 2 mg via INTRAVENOUS

## 2012-08-31 MED ORDER — LIDOCAINE HCL 1 % IJ SOLN
INTRAMUSCULAR | Status: DC | PRN
  Administered 2012-08-31: 10 mL

## 2012-08-31 MED ORDER — ONDANSETRON HCL 4 MG/2ML IJ SOLN
INTRAMUSCULAR | Status: DC | PRN
  Administered 2012-08-31: 4 mg via INTRAVENOUS

## 2012-08-31 MED ORDER — KETOROLAC TROMETHAMINE 30 MG/ML IJ SOLN
INTRAMUSCULAR | Status: AC
  Filled 2012-08-31: qty 1

## 2012-08-31 MED ORDER — KETOROLAC TROMETHAMINE 30 MG/ML IJ SOLN: 15.0000 mg | Freq: Once | INTRAMUSCULAR | Status: DC | PRN

## 2012-08-31 MED ORDER — KETOROLAC TROMETHAMINE 30 MG/ML IJ SOLN
INTRAMUSCULAR | Status: DC | PRN
  Administered 2012-08-31: 30 mg via INTRAVENOUS

## 2012-08-31 MED ORDER — 0.9 % SODIUM CHLORIDE (POUR BTL) OPTIME
TOPICAL | Status: DC | PRN
  Administered 2012-08-31: 1000 mL

## 2012-08-31 MED ORDER — LIDOCAINE HCL (CARDIAC) 20 MG/ML IV SOLN
INTRAVENOUS | Status: AC
  Filled 2012-08-31: qty 5

## 2012-08-31 MED ORDER — MIDAZOLAM HCL 2 MG/2ML IJ SOLN
INTRAMUSCULAR | Status: AC
  Filled 2012-08-31: qty 2

## 2012-08-31 SURGICAL SUPPLY — 17 items
CANISTER SUCTION 2500CC (MISCELLANEOUS) ×2 IMPLANT
CATH ROBINSON RED A/P 16FR (CATHETERS) IMPLANT
CLOTH BEACON ORANGE TIMEOUT ST (SAFETY) ×2 IMPLANT
CONTAINER PREFILL 10% NBF 60ML (FORM) ×4 IMPLANT
DRESSING TELFA 8X3 (GAUZE/BANDAGES/DRESSINGS) ×2 IMPLANT
ELECT REM PT RETURN 9FT ADLT (ELECTROSURGICAL)
ELECTRODE REM PT RTRN 9FT ADLT (ELECTROSURGICAL) IMPLANT
GLOVE BIO SURGEON STRL SZ7.5 (GLOVE) ×4 IMPLANT
GLOVE BIOGEL PI IND STRL 7.5 (GLOVE) ×1 IMPLANT
GLOVE BIOGEL PI INDICATOR 7.5 (GLOVE) ×1
GOWN STRL REIN XL XLG (GOWN DISPOSABLE) ×4 IMPLANT
LOOP ANGLED CUTTING 22FR (CUTTING LOOP) ×2 IMPLANT
PACK HYSTEROSCOPY LF (CUSTOM PROCEDURE TRAY) ×2 IMPLANT
PAD OB MATERNITY 4.3X12.25 (PERSONAL CARE ITEMS) ×2 IMPLANT
TOWEL OR 17X24 6PK STRL BLUE (TOWEL DISPOSABLE) ×4 IMPLANT
TRAY FOLEY BAG SILVER LF 14FR (CATHETERS) ×2 IMPLANT
WATER STERILE IRR 1000ML POUR (IV SOLUTION) ×2 IMPLANT

## 2012-08-31 NOTE — Anesthesia Procedure Notes (Signed)
Spinal  Patient location during procedure: OR Start time: 08/31/2012 12:00 PM Staffing Performed by: anesthesiologist  Preanesthetic Checklist Completed: patient identified, site marked, surgical consent, pre-op evaluation, timeout performed, IV checked, risks and benefits discussed and monitors and equipment checked Spinal Block Patient position: sitting Prep: site prepped and draped and DuraPrep Patient monitoring: heart rate, cardiac monitor, continuous pulse ox and blood pressure Approach: midline Location: L3-4 Injection technique: single-shot Needle Needle type: Sprotte  Needle gauge: 24 G Needle length: 9 cm Assessment Sensory level: T4 Additional Notes Clear free flow CSF on first attempt.  No paresthesia.  Patient tolerated procedure well.  Jasmine December, MD

## 2012-08-31 NOTE — H&P (View-Only) (Signed)
Grace Lyons is a 52 y.o. female G1P0 who presents for hysteroscopy with dilatation and curettage because of post menopausal bleeding.  After going more than twelve months without any vaginal bleeding, patient began to bleed in May 2013.  Initially she would have a five day flow with tampon change four times a day but denied any cramps. As months passed the frequency of her bleeding increased until in September it was constant. A pelvic ultrasound showed uteurs-7.8 x 3.4 x 4.4 cm with several fibroids: 4 cm fundal, 1.5 cm left subserosal and 1.6 cm midline intramural with normal appearing ovaries.  An endometrial biopsy CBC and TSH were all normal.  Patient denies any vaginitis symptoms, changes in bowel or bladder function.  She was given both medical and surgical management options for her symptoms and would like a Mirena IUD.  She has decided however, given that she is post menopausal [facility] = 59) she will first undergo hysteroscopy with D & C.   Past Medical History  OB History: G1P0 SVD 1995  GYN History: menarche : 52 YO;  LMP: 08/14/12;    Contracepton bilateral tubal ligation  The patient denies history of sexually transmitted disease.   Last PAP smear: 02/2012 ASCUS with negative HPV  Medical History: Diabetes mellitus, vitamin D deficiency, fibroids, cervical polyps, left rotator cuff injury, left knee pain, stress urinary incontinence  Surgical History:  1995 Tubal Sterilization;  2004 Lasik Eye Surgery Denies history of blood transfusions but has severe nausea and vomiting with anesthesia used during dental procedure.  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; Denies alcohol, tobacco or illicit drug use.   Outpatient Encounter Prescriptions as of 08/24/2012  Medication Sig Dispense Refill  . Cholecalciferol (VITAMIN D3) 1000 UNITS CAPS Take by mouth daily.        . [DISCONTINUED] Biotin 5  MG TABS Take by mouth daily.        Marland Kitchen ketoconazole (NIZORAL) 2 % cream Apply topically 2 (two) times daily.  30 g  0  . vitamin E 200 UNIT capsule Take 200 Units by mouth daily.        . [DISCONTINUED] calcium-vitamin D (OSCAL WITH D) 500-200 MG-UNIT per tablet Take 1 tablet by mouth 2 (two) times daily.        . [DISCONTINUED] clobetasol cream (TEMOVATE) 0.05 % Apply topically 2 (two) times daily. For 5 days  30 g  0    No Known Drug Allergies  Denies sensitivity to peanuts, soy, latex or adhesives. Patient does not eat seafood so doesn't know if she has a sensitivity.  ROS: Admits to random urine leakage with cough;  Denies headache, vision changes, nasal congestion, dysphagia, tinnitus, dizziness, hoarseness, cough,  chest pain, shortness of breath, nausea, vomiting, diarrhea,constipation,  urinary frequency, urgency  dysuria, hematuria, vaginitis symptoms, pelvic pain, swelling of joints,easy bruising,  myalgias, arthralgias, skin rashes, unexplained weight loss and except as is mentioned in the history of present illness, patient's review of systems is otherwise negative.  Physical Exam    BP 128/80  Pulse 80  Temp 98 F (36.7 C) (Oral)  Resp 16  Ht 5\' 2"  (1.575 m)  Wt 205 lb (92.987 kg)  BMI 37.49 kg/m2  LMP 08/14/2012  Neck: supple without masses or thyromegaly Lungs: clear to auscultation Heart: regular rate and rhythm Abdomen: soft, non-tender and no organomegaly Pelvic:EGBUS- wnl; vagina-normal with scant blood; uterus-normal size,  cervix without lesions or motion tenderness; adnexae-no tenderness or masses Extremities:  no clubbing, cyanosis or edema   Assesment:  Post Menopausal Bleeding                       Uterine Fibroids   Disposition:  A discussion was held with patient regarding the indication for her procedure(s) along with the risks, which include but are not limited to: reaction to anesthesia, damage to adjacent organs, infection and excessive bleeding.  Patient verbalized understanding of these risks and has consented to proceed with Hysteroscopy with Dilatation and Curettage at [facility], August 31, 2012 at 11:30 a.m.   CSN# [id number]   Isaid Salvia J. Lowell Guitar, PA-C  for Dr. Woodroe Mode. Su Hilt

## 2012-08-31 NOTE — Interval H&P Note (Signed)
History and Physical Interval Note:  08/31/2012 11:30 AM  Grace Lyons  has presented today for surgery, with the diagnosis of Post Menopausal Bleeding  The various methods of treatment have been discussed with the patient and family. After consideration of risks, benefits and other options for treatment, the patient has consented to  Procedure(s) (LRB) with comments: DILATATION AND CURETTAGE /HYSTEROSCOPY (N/A) as a surgical intervention .  The patient's history has been reviewed, patient examined, no change in status, stable for surgery.  I have reviewed the patient's chart and labs.  Questions were answered to the patient's satisfaction.     Purcell Nails

## 2012-08-31 NOTE — Transfer of Care (Signed)
Immediate Anesthesia Transfer of Care Note  Patient: Grace Lyons  Procedure(s) Performed: Procedure(s) (LRB) with comments: DILATATION AND CURETTAGE /HYSTEROSCOPY (N/A) - with resectoscope  Patient Location: PACU  Anesthesia Type:Spinal  Level of Consciousness: awake, alert  and oriented  Airway & Oxygen Therapy: Patient Spontanous Breathing and Patient connected to nasal cannula oxygen  Post-op Assessment: Report given to PACU RN and Post -op Vital signs reviewed and stable  Post vital signs: Reviewed and stable  Complications: No apparent anesthesia complications

## 2012-08-31 NOTE — Anesthesia Preprocedure Evaluation (Addendum)
Anesthesia Evaluation  Patient identified by MRN, date of birth, ID band Patient awake    Reviewed: Allergy & Precautions, H&P , NPO status , Patient's Chart, lab work & pertinent test results, reviewed documented beta blocker date and time   History of Anesthesia Complications (+) PONV  Airway Mallampati: II TM Distance: >3 FB Neck ROM: full    Dental  (+) Teeth Intact   Pulmonary neg pulmonary ROS,  breath sounds clear to auscultation  Pulmonary exam normal       Cardiovascular Exercise Tolerance: Good negative cardio ROS  Rhythm:regular Rate:Normal     Neuro/Psych negative neurological ROS  negative psych ROS   GI/Hepatic negative GI ROS, Neg liver ROS,   Endo/Other  diabetes ("prediabetic" - doing diet and exercise modification), Type 2obesity  Renal/GU negative Renal ROS  Female GU complaint     Musculoskeletal   Abdominal   Peds  Hematology negative hematology ROS (+)   Anesthesia Other Findings   Reproductive/Obstetrics negative OB ROS                          Anesthesia Physical Anesthesia Plan  ASA: III  Anesthesia Plan: Spinal and MAC   Post-op Pain Management:    Induction:   Airway Management Planned:   Additional Equipment:   Intra-op Plan:   Post-operative Plan:   Informed Consent: I have reviewed the patients History and Physical, chart, labs and discussed the procedure including the risks, benefits and alternatives for the proposed anesthesia with the patient or authorized representative who has indicated his/her understanding and acceptance.   Dental Advisory Given  Plan Discussed with: CRNA and Surgeon  Anesthesia Plan Comments: (Patient prefers spinal over GA)       Anesthesia Quick Evaluation

## 2012-08-31 NOTE — Anesthesia Postprocedure Evaluation (Signed)
Anesthesia Post Note  Patient: Grace Lyons  Procedure(s) Performed: Procedure(s) (LRB): DILATATION AND CURETTAGE /HYSTEROSCOPY (N/A)  Anesthesia type: Spinal  Patient location: PACU  Post pain: Pain level controlled  Post assessment: Post-op Vital signs reviewed  Last Vitals:  Filed Vitals:   08/31/12 1400  BP: 130/66  Pulse: 59  Temp: 36.4 C  Resp: 14    Post vital signs: Reviewed  Level of consciousness: awake  Complications: No apparent anesthesia complications

## 2012-08-31 NOTE — Op Note (Addendum)
Preop Diagnosis: Post Menopausal Bleeding   Postop Diagnosis: Post Menopausal Bleeding   Procedure: 1. HYSTEROSCOPY 2.DILATATION AND CURETTAGE 3.RESECTION of FIBROID  Anesthesia: Spinal   Anesthesiologist: Dana Allan, MD   Attending: Purcell Nails, MD   Assistant: N/a  Findings: Posterior submucosal fibroid and large polyp extending into cervical canal with base at left fundus  Pathology: 1.Endometrial Curretings 2.Portions of Resected Fibroid 3.Portions of Polyp  Fluids: 1300cc Hysteroscopic fluid deficit 290cc  UOP: 100cc  EBL: 290cc  Complications: None  Procedure: The patient was taken to the operating room after the risks, benefits and alternatives were discussed with the patient. The patient verbalized understanding and consent signed and witnessed. The patient was given a spinal per anesthesiologist and prepped and draped in the normal sterile fashion.  Time Out was performed per protocol.  A bivalve speculum was placed in the patient's vagina and the anterior lip of the cervix was grasped with a single tooth tenaculum. A paracervical block was administered using a total of 10 cc of 1% lidocaine. The uterus sounded to 8 cm. The cervix was dilated for passage of the hysteroscope.  The hysteroscope was introduced into the uterine cavity and findings as noted above. Resectoscopy was then put together and portions of fibroid and remaining base of polyp were resected after polyp forceps removed the bulk of the polyp.  The fibroid was also grasped after much of the resection and seemed to be removed in its entirety.  Sharp curettage was performed until a gritty texture was noted and curretings were sent to pathology. The hysteroscope was reintroduced and no obvious remaining intracavitary lesions were noted.  All instruments were removed. Sponge lap and needle count was correct. The patient tolerated the procedure well and was returned to the recovery room in good condition.

## 2012-09-03 ENCOUNTER — Encounter (HOSPITAL_COMMUNITY): Payer: Self-pay | Admitting: Obstetrics and Gynecology

## 2012-09-14 ENCOUNTER — Encounter: Payer: Self-pay | Admitting: Obstetrics and Gynecology

## 2012-09-14 ENCOUNTER — Ambulatory Visit: Payer: BC Managed Care – PPO | Admitting: Obstetrics and Gynecology

## 2012-09-14 VITALS — BP 114/70 | Ht 62.0 in | Wt 202.0 lb

## 2012-09-14 DIAGNOSIS — N898 Other specified noninflammatory disorders of vagina: Secondary | ICD-10-CM

## 2012-09-14 LAB — POCT WET PREP (WET MOUNT)
Bacteria Wet Prep HPF POC: NEGATIVE
Clue Cells Wet Prep Whiff POC: NEGATIVE
KOH Wet Prep POC: NEGATIVE
Source Wet Prep POC: NEGATIVE
Trichomonas Wet Prep HPF POC: NEGATIVE
WBC, Wet Prep HPF POC: NEGATIVE
pH: 5.5

## 2012-09-14 NOTE — Progress Notes (Signed)
DATE OF SURGERY: 08/31/2012 TYPE OF SURGERY:Hysteroscopy, D&C PAIN:No VAG BLEEDING: Spotting VAG DISCHARGE: yes NORMAL GI FUNCTN: yes NORMAL GU FUNCTN: yes  C/o discharge  Filed Vitals:   09/14/12 1355  BP: 114/70   ROS: noncontributory  Pelvic exam:  VULVA: normal appearing vulva with no masses, tenderness or lesions,  VAGINA: normal appearing vagina with normal color and discharge, no lesions, brown d/c CERVIX: normal appearing cervix without discharge or lesions,  UTERUS: uterus is normal size, shape, consistency and nontender,  ADNEXA: normal adnexa in size, nontender and no masses.  Path - benign  A/P Wet prep - neg July for [employer] ph - rec trial of rephresh

## 2012-09-29 ENCOUNTER — Other Ambulatory Visit: Payer: Self-pay

## 2012-11-06 ENCOUNTER — Other Ambulatory Visit: Payer: Self-pay

## 2012-11-06 DIAGNOSIS — Z1231 Encounter for screening mammogram for malignant neoplasm of breast: Secondary | ICD-10-CM

## 2012-11-22 ENCOUNTER — Telehealth: Payer: Self-pay | Admitting: *Deleted

## 2012-11-22 DIAGNOSIS — E119 Type 2 diabetes mellitus without complications: Secondary | ICD-10-CM

## 2012-11-22 DIAGNOSIS — Z0000 Encounter for general adult medical examination without abnormal findings: Secondary | ICD-10-CM

## 2012-11-22 NOTE — Telephone Encounter (Signed)
Received staff msg entered cpx labs in comp...lmb

## 2012-11-22 NOTE — Telephone Encounter (Signed)
Message copied by Deatra James on Thu Nov 22, 2012 11:41 AM ------      Message from: Livingston Diones      Created: Thu Nov 22, 2012 10:30 AM       Please put in lab for CPE a week prior. Pt has an appt 01/09/13.             Thanks      [place] ------

## 2012-11-26 ENCOUNTER — Ambulatory Visit: Payer: BC Managed Care – PPO | Admitting: Internal Medicine

## 2013-01-09 ENCOUNTER — Ambulatory Visit
Admission: RE | Admit: 2013-01-09 | Discharge: 2013-01-09 | Disposition: A | Discharge status: Home / Self Care | Payer: BC Managed Care – PPO | Source: Ambulatory Visit

## 2013-01-09 ENCOUNTER — Ambulatory Visit: Payer: BC Managed Care – PPO

## 2013-01-09 ENCOUNTER — Ambulatory Visit (INDEPENDENT_AMBULATORY_CARE_PROVIDER_SITE_OTHER): Payer: BC Managed Care – PPO | Admitting: Internal Medicine

## 2013-01-09 ENCOUNTER — Encounter: Payer: Self-pay | Admitting: Internal Medicine

## 2013-01-09 VITALS — BP 124/74 | HR 78 | Temp 98.4°F | Ht 62.0 in | Wt 198.0 lb

## 2013-01-09 DIAGNOSIS — E119 Type 2 diabetes mellitus without complications: Secondary | ICD-10-CM

## 2013-01-09 DIAGNOSIS — Z0000 Encounter for general adult medical examination without abnormal findings: Secondary | ICD-10-CM

## 2013-01-09 DIAGNOSIS — R7989 Other specified abnormal findings of blood chemistry: Secondary | ICD-10-CM

## 2013-01-09 DIAGNOSIS — R6889 Other general symptoms and signs: Secondary | ICD-10-CM

## 2013-01-09 DIAGNOSIS — Z1231 Encounter for screening mammogram for malignant neoplasm of breast: Secondary | ICD-10-CM

## 2013-01-09 DIAGNOSIS — I10 Essential (primary) hypertension: Secondary | ICD-10-CM

## 2013-01-09 LAB — CBC WITH DIFFERENTIAL/PLATELET
Basophils Absolute: 0 10*3/uL (ref 0.0–0.1)
Basophils Relative: 0.5 % (ref 0.0–3.0)
Eosinophils Absolute: 0.2 10*3/uL (ref 0.0–0.7)
Eosinophils Relative: 3.7 % (ref 0.0–5.0)
HCT: 39.3 % (ref 36.0–46.0)
Hemoglobin: 13.2 g/dL (ref 12.0–15.0)
Lymphocytes Relative: 27.8 % (ref 12.0–46.0)
Lymphs Abs: 1.5 10*3/uL (ref 0.7–4.0)
MCHC: 33.5 g/dL (ref 30.0–36.0)
MCV: 91.9 fl (ref 78.0–100.0)
Monocytes Absolute: 0.5 10*3/uL (ref 0.1–1.0)
Monocytes Relative: 9.3 % (ref 3.0–12.0)
Neutro Abs: 3.2 10*3/uL (ref 1.4–7.7)
Neutrophils Relative %: 58.7 % (ref 43.0–77.0)
Platelets: 239 10*3/uL (ref 150.0–400.0)
RBC: 4.28 Mil/uL (ref 3.87–5.11)
RDW: 14.7 % — ABNORMAL HIGH (ref 11.5–14.6)
WBC: 5.5 10*3/uL (ref 4.5–10.5)

## 2013-01-09 LAB — MICROALBUMIN / CREATININE URINE RATIO
Creatinine,U: 120.8 mg/dL
Microalb Creat Ratio: 0.2 mg/g (ref 0.0–30.0)
Microalb, Ur: 0.3 mg/dL (ref 0.0–1.9)

## 2013-01-09 LAB — BASIC METABOLIC PANEL
BUN: 11 mg/dL (ref 6–23)
CO2: 28 mEq/L (ref 19–32)
Calcium: 9.7 mg/dL (ref 8.4–10.5)
Chloride: 107 mEq/L (ref 96–112)
Creatinine, Ser: 0.7 mg/dL (ref 0.4–1.2)
GFR: 111.01 mL/min (ref >60.00)
Glucose, Bld: 101 mg/dL — ABNORMAL HIGH (ref 70–99)
Potassium: 4.3 mEq/L (ref 3.5–5.1)
Sodium: 137 mEq/L (ref 135–145)

## 2013-01-09 LAB — URINALYSIS, ROUTINE W REFLEX MICROSCOPIC
Bilirubin Urine: NEGATIVE
Ketones, ur: NEGATIVE
Leukocytes, UA: NEGATIVE
Nitrite: NEGATIVE
Specific Gravity, Urine: 1.02 (ref 1.000–1.030)
Total Protein, Urine: NEGATIVE
Urine Glucose: NEGATIVE
Urobilinogen, UA: 0.2 (ref 0.0–1.0)
pH: 7 (ref 5.0–8.0)

## 2013-01-09 LAB — LIPID PANEL
Cholesterol: 123 mg/dL (ref 0–200)
HDL: 59.9 mg/dL (ref >39.00)
LDL Cholesterol: 55 mg/dL (ref 0–99)
Total CHOL/HDL Ratio: 2
Triglycerides: 43 mg/dL (ref 0.0–149.0)
VLDL: 8.6 mg/dL (ref 0.0–40.0)

## 2013-01-09 LAB — HEPATIC FUNCTION PANEL
ALT: 26 U/L (ref 0–35)
AST: 18 U/L (ref 0–37)
Albumin: 3.9 g/dL (ref 3.5–5.2)
Alkaline Phosphatase: 59 U/L (ref 39–117)
Bilirubin, Direct: 0.1 mg/dL (ref 0.0–0.3)
Total Bilirubin: 0.5 mg/dL (ref 0.3–1.2)
Total Protein: 7.2 g/dL (ref 6.0–8.3)

## 2013-01-09 LAB — HEMOGLOBIN A1C: Hgb A1c MFr Bld: 6.7 % — ABNORMAL HIGH (ref 4.6–6.5)

## 2013-01-09 LAB — TSH: TSH: 0.5 u[IU]/mL (ref 0.35–5.50)

## 2013-01-09 LAB — T4, FREE: Free T4: 0.93 ng/dL (ref 0.60–1.60)

## 2013-01-09 MED ORDER — CLOBETASOL PROPIONATE 0.05 % EX CREA: TOPICAL_CREAM | Freq: Two times a day (BID) | CUTANEOUS | Status: DC

## 2013-01-09 NOTE — Assessment & Plan Note (Signed)
Same noted CPX 2013  S/p endo eval 01/2012 - recommended observation and recheck with FT4

## 2013-01-09 NOTE — Patient Instructions (Signed)
It was good to see you today. We have reviewed your prior records including labs and tests today Medications reviewed and updated, no changes recommended at this time. Refill on medication(s) as discussed today. Health Maintenance reviewed - all recommended immunizations and age-appropriate screenings are up-to-date.  Test(s) ordered today. Your results will be released to MyChart (or called to you) after review, usually within 72hours after test completion. If any changes need to be made, you will be notified at that same time. Please schedule followup in 12 months for annual physical and labs, call sooner if problems. Health Maintenance, Females A healthy lifestyle and preventative care can promote health and wellness.  Maintain regular health, dental, and eye exams.  Eat a healthy diet. Foods like vegetables, fruits, whole grains, low-fat dairy products, and lean protein foods contain the nutrients you need without too many calories. Decrease your intake of foods high in solid fats, added sugars, and salt. Get information about a proper diet from your caregiver, if necessary.  Regular physical exercise is one of the most important things you can do for your health. Most adults should get at least 150 minutes of moderate-intensity exercise (any activity that increases your heart rate and causes you to sweat) each week. In addition, most adults need muscle-strengthening exercises on 2 or more days a week.   Maintain a healthy weight. The body mass index (BMI) is a screening tool to identify possible weight problems. It provides an estimate of body fat based on height and weight. Your caregiver can help determine your BMI, and can help you achieve or maintain a healthy weight. For adults 20 years and older:  A BMI below 18.5 is considered underweight.  A BMI of 18.5 to 24.9 is normal.  A BMI of 25 to 29.9 is considered overweight.  A BMI of 30 and above is considered obese.  Maintain  normal blood lipids and cholesterol by exercising and minimizing your intake of saturated fat. Eat a balanced diet with plenty of fruits and vegetables. Blood tests for lipids and cholesterol should begin at age 11 and be repeated every 5 years. If your lipid or cholesterol levels are high, you are over 50, or you are a high risk for heart disease, you may need your cholesterol levels checked more frequently.Ongoing high lipid and cholesterol levels should be treated with medicines if diet and exercise are not effective.  If you smoke, find out from your caregiver how to quit. If you do not use tobacco, do not start.  If you are pregnant, do not drink alcohol. If you are breastfeeding, be very cautious about drinking alcohol. If you are not pregnant and choose to drink alcohol, do not exceed 1 drink per day. One drink is considered to be 12 ounces (355 mL) of beer, 5 ounces (148 mL) of wine, or 1.5 ounces (44 mL) of liquor.  Avoid use of street drugs. Do not share needles with anyone. Ask for help if you need support or instructions about stopping the use of drugs.  High blood pressure causes heart disease and increases the risk of stroke. Blood pressure should be checked at least every 1 to 2 years. Ongoing high blood pressure should be treated with medicines, if weight loss and exercise are not effective.  If you are 56 to 52 years old, ask your caregiver if you should take aspirin to prevent strokes.  Diabetes screening involves taking a blood sample to check your fasting blood sugar level. This should be  done once every 3 years, after age 39, if you are within normal weight and without risk factors for diabetes. Testing should be considered at a younger age or be carried out more frequently if you are overweight and have at least 1 risk factor for diabetes.  Breast cancer screening is essential preventative care for women. You should practice "breast self-awareness." This means understanding the  normal appearance and feel of your breasts and may include breast self-examination. Any changes detected, no matter how small, should be reported to a caregiver. Women in their 86s and 30s should have a clinical breast exam (CBE) by a caregiver as part of a regular health exam every 1 to 3 years. After age 6, women should have a CBE every year. Starting at age 74, women should consider having a mammogram (breast X-ray) every year. Women who have a family history of breast cancer should talk to their caregiver about genetic screening. Women at a high risk of breast cancer should talk to their caregiver about having an MRI and a mammogram every year.  The Pap test is a screening test for cervical cancer. Women should have a Pap test starting at age 61. Between ages 78 and 38, Pap tests should be repeated every 2 years. Beginning at age 36, you should have a Pap test every 3 years as long as the past 3 Pap tests have been normal. If you had a hysterectomy for a problem that was not cancer or a condition that could lead to cancer, then you no longer need Pap tests. If you are between ages 28 and 9, and you have had normal Pap tests going back 10 years, you no longer need Pap tests. If you have had past treatment for cervical cancer or a condition that could lead to cancer, you need Pap tests and screening for cancer for at least 20 years after your treatment. If Pap tests have been discontinued, risk factors (such as a new sexual partner) need to be reassessed to determine if screening should be resumed. Some women have medical problems that increase the chance of getting cervical cancer. In these cases, your caregiver may recommend more frequent screening and Pap tests.  The human papillomavirus (HPV) test is an additional test that may be used for cervical cancer screening. The HPV test looks for the virus that can cause the cell changes on the cervix. The cells collected during the Pap test can be tested for  HPV. The HPV test could be used to screen women aged 33 years and older, and should be used in women of any age who have unclear Pap test results. After the age of 13, women should have HPV testing at the same frequency as a Pap test.  Colorectal cancer can be detected and often prevented. Most routine colorectal cancer screening begins at the age of 55 and continues through age 61. However, your caregiver may recommend screening at an earlier age if you have risk factors for colon cancer. On a yearly basis, your caregiver may provide home test kits to check for hidden blood in the stool. Use of a small camera at the end of a tube, to directly examine the colon (sigmoidoscopy or colonoscopy), can detect the earliest forms of colorectal cancer. Talk to your caregiver about this at age 63, when routine screening begins. Direct examination of the colon should be repeated every 5 to 10 years through age 79, unless early forms of pre-cancerous polyps or small growths are found.  Hepatitis C blood testing is recommended for all people born from [birth year] through 1965 and any individual with known risks for hepatitis C.  Practice safe sex. Use condoms and avoid high-risk sexual practices to reduce the spread of sexually transmitted infections (STIs). Sexually active women aged 73 and younger should be checked for Chlamydia, which is a common sexually transmitted infection. Older women with new or multiple partners should also be tested for Chlamydia. Testing for other STIs is recommended if you are sexually active and at increased risk.  Osteoporosis is a disease in which the bones lose minerals and strength with aging. This can result in serious bone fractures. The risk of osteoporosis can be identified using a bone density scan. Women ages 61 and over and women at risk for fractures or osteoporosis should discuss screening with their caregivers. Ask your caregiver whether you should be taking a calcium supplement or  vitamin D to reduce the rate of osteoporosis.  Menopause can be associated with physical symptoms and risks. Hormone replacement therapy is available to decrease symptoms and risks. You should talk to your caregiver about whether hormone replacement therapy is right for you.  Use sunscreen with a sun protection factor (SPF) of 30 or greater. Apply sunscreen liberally and repeatedly throughout the day. You should seek shade when your shadow is shorter than you. Protect yourself by wearing long sleeves, pants, a wide-brimmed hat, and sunglasses year round, whenever you are outdoors.  Notify your caregiver of new moles or changes in moles, especially if there is a change in shape or color. Also notify your caregiver if a mole is larger than the size of a pencil eraser.  Stay current with your immunizations. Document Released: 02/14/2011 Document Revised: 10/24/2011 Document Reviewed: 02/14/2011 [facility] Patient Information 2014 [place], [place]. Exercise to Lose Weight Exercise and a healthy diet may help you lose weight. Your doctor may suggest specific exercises. EXERCISE IDEAS AND TIPS  Choose low-cost things you enjoy doing, such as walking, bicycling, or exercising to workout videos.  Take stairs instead of the elevator.  Walk during your lunch break.  Park your car further away from work or school.  Go to a gym or an exercise class.  Start with 5 to 10 minutes of exercise each day. Build up to 30 minutes of exercise 4 to 6 days a week.  Wear shoes with good support and comfortable clothes.  Stretch before and after working out.  Work out until you breathe harder and your heart beats faster.  Drink extra water when you exercise.  Do not do so much that you hurt yourself, feel dizzy, or get very short of breath. Exercises that burn about 150 calories:  Running 1  miles in 15 minutes.  Playing volleyball for 45 to 60 minutes.  Washing and waxing a car for 45 to 60  minutes.  Playing touch football for 45 minutes.  Walking 1  miles in 35 minutes.  Pushing a stroller 1  miles in 30 minutes.  Playing basketball for 30 minutes.  Raking leaves for 30 minutes.  Bicycling 5 miles in 30 minutes.  Walking 2 miles in 30 minutes.  Dancing for 30 minutes.  Shoveling snow for 15 minutes.  Swimming laps for 20 minutes.  Walking up stairs for 15 minutes.  Bicycling 4 miles in 15 minutes.  Gardening for 30 to 45 minutes.  Jumping rope for 15 minutes.  Washing windows or floors for 45 to 60 minutes. Document Released: 09/03/2010 Document Revised:  10/24/2011 Document Reviewed: 09/03/2010 [employer] Patient Information 2014 [place], [place].

## 2013-01-09 NOTE — Progress Notes (Signed)
Subjective:    Patient ID: Grace Lyons, female    DOB: March 28, 1961, 52 y.o.   MRN: [id number]  HPI  patient is here today for annual physical. Patient feels well and has no complaints.  Also reviewed chronic medical issues:  diet controlled DM2 - has been told prev she needs DM diet and weight loss to avoid medications; +FH DM - beginning May 2011 instituted self directed dietary changes to cut out sodas, no rice or white bread - also walking, sit ups and arm weights - feels clothes fitting better - denies PU/PD  vit D defic - reports compliance with ongoing medical treatment and no changes in medication dose or frequency. denies adverse side effects related to current therapy. no constipation - no bone pain or hx fx - not taking calcium and eats little dairy food  obesity - as above, working on diet and weight changes - slow weight changes over time - no skin or bowel changes -    Past Medical History  Diagnosis Date  . VITAMIN D DEFICIENCY   . OBESITY   . Fibroid   . Cervical polyp   . Menometrorrhagia    Family History  Problem Relation Age of Onset  . Diabetes Mother   . Hypertension Mother   . Diabetes Father   . Hypertension Father    History  Substance Use Topics  . Smoking status: Former Smoker    Quit date: 01/12/1996  . Smokeless tobacco: Not on file     Comment: Single, lives alone. 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 Alcohol Use: No   Review of Systems  Constitutional: Negative for fever or weight change.  Respiratory: Negative for cough and shortness of breath.   Cardiovascular: Negative for chest pain or palpitations.  Gastrointestinal: Negative for abdominal pain, no bowel changes.  Musculoskeletal: Negative for gait problem or joint swelling.  Skin: Negative for rash.  Neurological: Negative for dizziness or headache.  No other specific complaints in a complete review of systems (except as listed in  HPI above).     Objective:   Physical Exam  BP 124/74  Pulse 78  Temp(Src) 98.4 F (36.9 C) (Oral)  Ht 5\' 2"  (1.575 m)  Wt 198 lb (89.812 kg)  BMI 36.21 kg/m2  SpO2 96% Wt Readings from Last 3 Encounters:  01/09/13 198 lb (89.812 kg)  09/14/12 202 lb (91.627 kg)  08/24/12 205 lb (92.987 kg)   Constitutional: She is overweight, but appears well-developed and well-nourished. No distress.  HENT: Head: Normocephalic and atraumatic. Ears: B TMs ok, no erythema or effusion; Nose: Nose normal. Mouth/Throat: Oropharynx is clear and moist. No oropharyngeal exudate.  Eyes: Conjunctivae and EOM are normal. Pupils are equal, round, and reactive to light. No scleral icterus.  Neck: Normal range of motion. Neck supple. No JVD present. No thyromegaly present.  Cardiovascular: Normal rate, regular rhythm and normal heart sounds.  No murmur heard. No BLE edema. Pulmonary/Chest: Effort normal and breath sounds normal. No respiratory distress. She has no wheezes.  Abdominal: Soft. Bowel sounds are normal. She exhibits no distension. There is no tenderness. no masses Musculoskeletal: Normal range of motion, no joint effusions. No gross deformities Neurological: She is alert and oriented to person, place, and time. No cranial nerve deficit. Coordination normal.  Skin: skin is warm and dry. No rash noted. No erythema.  Psychiatric: She has a normal mood and affect. Her behavior is  normal. Judgment and thought content normal.   Lab Results  Component Value Date   WBC 5.2 08/31/2012   HGB 13.8 08/31/2012   HCT 40.1 08/31/2012   PLT 273 08/31/2012   GLUCOSE 90 01/04/2012   CHOL 127 01/04/2012   TRIG 35.0 01/04/2012   HDL 58.20 01/04/2012   LDLCALC 62 01/04/2012   ALT 25 01/04/2012   AST 19 01/04/2012   NA 138 01/04/2012   K 3.6 01/04/2012   CL 103 01/04/2012   CREATININE 0.7 01/04/2012   BUN 9 01/04/2012   CO2 28 01/04/2012   TSH 0.373 02/21/2012   HGBA1C 6.5 01/04/2012   ECG: sinus @ 72 bpm - no ischemic  change     Assessment & Plan:  CPX/v70.0 - Patient has been counseled on age-appropriate routine health concerns for screening and prevention. These are reviewed and up-to-date. Immunizations are up-to-date or declined. Labs and ECG reviewed.   Also See problem list. Medications and labs reviewed today.

## 2013-01-09 NOTE — Assessment & Plan Note (Signed)
diet controlled - check a1c now - continue to work on weight, exercise and diet Lab Results  Component Value Date   HGBA1C 6.5 01/04/2012

## 2013-02-05 ENCOUNTER — Telehealth: Payer: Self-pay | Admitting: Internal Medicine

## 2013-02-05 NOTE — Telephone Encounter (Signed)
Patient Information:  Caller Name: Jenaye  Phone: [phone]  Patient: Grace Lyons  Gender: Female  DOB: 03/24/1961  Age: 52 Years  PCP: Rene Paci (Adults only)  Pregnant: No  Office Follow Up:  Does the office need to follow up with this patient?: No  Instructions For The Office: N/A  RN Note:  Emergent call:  Calling from work.  LMP Jan 2014. Asking what to take for itchy generalized rash.  Does not test FBS; stated is not diabetic. May use 25-50 mg Benadryl po every 6 hours prn itching. Try cool compresses it itchy areas.  Declined to schedule appointment now.  Prefers to try Benadryl and call back for appointment.  Symptoms  Reason For Call & Symptoms: Fine, papular, itching, burning generalized rash on chest, arms, torso, legs, and face. [place] and used hot tub on 02/03/13.  Reviewed Health History In EMR: Yes  Reviewed Medications In EMR: Yes  Reviewed Allergies In EMR: Yes  Reviewed Surgeries / Procedures: Yes  Date of Onset of Symptoms: 02/03/2013  Treatments Tried: Alcohol, Neosporin  Treatments Tried Worked: No OB / GYN:  LMP: Unknown  Guideline(s) Used:  Rash or Redness - Widespread  Disposition Per Guideline:   See Today or Tomorrow in Office  Reason For Disposition Reached:   Mild widespread rash  Advice Given:  Reassurance:  There are many causes of widespread rashes and most of the time they are not serious. Common causes include viral illness (e.g., cold viruses) and allergic reactions (to a food, medicine, or environmental exposure).  For Itchy Rashes:  Wash the skin once with gentle non-perfumed soap to remove any irritants. Rinse the soap off thoroughly.  You may also take an oatmeal (Aveeno) bath or take an antihistamine medication by mouth to help reduce the itching.  Oral Antihistamine Medication for Itching:  Take an antihistamine like diphenhydramine (Benadryl) for widespread rashes that itch. The adult dosage of  Benadryl is 25-50 mg by mouth 4 times daily.  An over-the-counter antihistamine that causes less sleepiness is loratadine (e.g., Alavert or Claritin).  Call Back If:   You become worse  Patient Refused Recommendation:  Patient Will Make Own Appointment  Declined to schedule now.

## 2013-02-06 ENCOUNTER — Telehealth: Payer: Self-pay | Admitting: *Deleted

## 2013-02-06 MED ORDER — CLOBETASOL PROPIONATE 0.05 % EX CREA: TOPICAL_CREAM | Freq: Two times a day (BID) | CUTANEOUS | Status: DC

## 2013-02-06 NOTE — Telephone Encounter (Signed)
Pt called and was needing refill on her clobetasol. Md approve, but msg was written in the wrong chart. Sending cream to [facility]...Raechel Chute

## 2013-04-17 ENCOUNTER — Telehealth: Payer: Self-pay | Admitting: *Deleted

## 2013-04-17 MED ORDER — TRIAMCINOLONE ACETONIDE 0.1 % EX CREA: TOPICAL_CREAM | Freq: Two times a day (BID) | CUTANEOUS | Status: DC

## 2013-04-17 NOTE — Telephone Encounter (Signed)
Pt called states she was treated for a burn to her hand, she is requesting an Rx for Triamcinolone 0.1%.  Further states this is more effective in her experience than the burn cream.  Please advise

## 2013-04-17 NOTE — Telephone Encounter (Signed)
Spoke with pt advised Rx sent 

## 2013-04-17 NOTE — Telephone Encounter (Signed)
Not standard of care, but I see no harm in trying cream requested  - erx done

## 2013-06-20 ENCOUNTER — Other Ambulatory Visit: Payer: Self-pay

## 2013-08-13 ENCOUNTER — Ambulatory Visit: Payer: BC Managed Care – PPO | Admitting: Internal Medicine

## 2013-11-26 ENCOUNTER — Other Ambulatory Visit: Payer: Self-pay

## 2013-11-26 DIAGNOSIS — Z1231 Encounter for screening mammogram for malignant neoplasm of breast: Secondary | ICD-10-CM

## 2014-01-10 ENCOUNTER — Encounter: Payer: BC Managed Care – PPO | Admitting: Internal Medicine

## 2014-01-10 ENCOUNTER — Ambulatory Visit
Admission: RE | Admit: 2014-01-10 | Discharge: 2014-01-10 | Disposition: A | Discharge status: Home / Self Care | Payer: BC Managed Care – PPO | Source: Ambulatory Visit

## 2014-01-10 DIAGNOSIS — Z1231 Encounter for screening mammogram for malignant neoplasm of breast: Secondary | ICD-10-CM

## 2014-01-21 ENCOUNTER — Other Ambulatory Visit (INDEPENDENT_AMBULATORY_CARE_PROVIDER_SITE_OTHER): Payer: BC Managed Care – PPO

## 2014-01-21 ENCOUNTER — Encounter: Payer: Self-pay | Admitting: Internal Medicine

## 2014-01-21 ENCOUNTER — Telehealth: Payer: Self-pay | Admitting: *Deleted

## 2014-01-21 ENCOUNTER — Ambulatory Visit (INDEPENDENT_AMBULATORY_CARE_PROVIDER_SITE_OTHER): Payer: BC Managed Care – PPO | Admitting: Internal Medicine

## 2014-01-21 VITALS — BP 118/82 | HR 72 | Temp 98.8°F | Ht 62.0 in | Wt 190.4 lb

## 2014-01-21 DIAGNOSIS — Z0000 Encounter for general adult medical examination without abnormal findings: Secondary | ICD-10-CM

## 2014-01-21 LAB — URINALYSIS, ROUTINE W REFLEX MICROSCOPIC
Bilirubin Urine: NEGATIVE
Ketones, ur: NEGATIVE
Nitrite: NEGATIVE
Specific Gravity, Urine: 1.025 (ref 1.000–1.030)
Total Protein, Urine: NEGATIVE
Urine Glucose: NEGATIVE
Urobilinogen, UA: 0.2 (ref 0.0–1.0)
pH: 6 (ref 5.0–8.0)

## 2014-01-21 LAB — CBC WITH DIFFERENTIAL/PLATELET
Basophils Absolute: 0 10*3/uL (ref 0.0–0.1)
Basophils Relative: 0.6 % (ref 0.0–3.0)
Eosinophils Absolute: 0.1 10*3/uL (ref 0.0–0.7)
Eosinophils Relative: 1.9 % (ref 0.0–5.0)
HCT: 39.3 % (ref 36.0–46.0)
Hemoglobin: 13.1 g/dL (ref 12.0–15.0)
Lymphocytes Relative: 37.2 % (ref 12.0–46.0)
Lymphs Abs: 2.2 10*3/uL (ref 0.7–4.0)
MCHC: 33.3 g/dL (ref 30.0–36.0)
MCV: 92.8 fl (ref 78.0–100.0)
Monocytes Absolute: 0.5 10*3/uL (ref 0.1–1.0)
Monocytes Relative: 8.9 % (ref 3.0–12.0)
Neutro Abs: 3.1 10*3/uL (ref 1.4–7.7)
Neutrophils Relative %: 51.4 % (ref 43.0–77.0)
Platelets: 253 10*3/uL (ref 150.0–400.0)
RBC: 4.23 Mil/uL (ref 3.87–5.11)
RDW: 14.5 % (ref 11.5–15.5)
WBC: 5.9 10*3/uL (ref 4.0–10.5)

## 2014-01-21 LAB — BASIC METABOLIC PANEL
BUN: 12 mg/dL (ref 6–23)
CO2: 26 mEq/L (ref 19–32)
Calcium: 10.2 mg/dL (ref 8.4–10.5)
Chloride: 105 mEq/L (ref 96–112)
Creatinine, Ser: 0.7 mg/dL (ref 0.4–1.2)
GFR: 118.23 mL/min (ref >60.00)
Glucose, Bld: 103 mg/dL — ABNORMAL HIGH (ref 70–99)
Potassium: 3.9 mEq/L (ref 3.5–5.1)
Sodium: 138 mEq/L (ref 135–145)

## 2014-01-21 LAB — LIPID PANEL
Cholesterol: 137 mg/dL (ref 0–200)
HDL: 60.8 mg/dL (ref >39.00)
LDL Cholesterol: 68 mg/dL (ref 0–99)
NonHDL: 76.2
Total CHOL/HDL Ratio: 2
Triglycerides: 39 mg/dL (ref 0.0–149.0)
VLDL: 7.8 mg/dL (ref 0.0–40.0)

## 2014-01-21 LAB — HEPATIC FUNCTION PANEL
ALT: 25 U/L (ref 0–35)
AST: 20 U/L (ref 0–37)
Albumin: 4.3 g/dL (ref 3.5–5.2)
Alkaline Phosphatase: 55 U/L (ref 39–117)
Bilirubin, Direct: 0.2 mg/dL (ref 0.0–0.3)
Total Bilirubin: 0.8 mg/dL (ref 0.2–1.2)
Total Protein: 7.1 g/dL (ref 6.0–8.3)

## 2014-01-21 LAB — TSH: TSH: 0.35 u[IU]/mL (ref 0.35–4.50)

## 2014-01-21 NOTE — Progress Notes (Signed)
Pre visit review using our clinic review tool, if applicable. No additional management support is needed unless otherwise documented below in the visit note. 

## 2014-01-21 NOTE — Telephone Encounter (Signed)
Pt was schedule this am for cpx. Pt had to leave she stated she has been waiting more than 45 mins, hadn't had anything to eat. Reschedule to 6/30 wanted to go ahead and do cpx labs...Johny Chess

## 2014-01-22 NOTE — Progress Notes (Signed)
A user error has taken place: encounter opened in error, closed for administrative reasons.  MD running late - pt rescheduled, not seen on this day

## 2014-02-11 ENCOUNTER — Other Ambulatory Visit (INDEPENDENT_AMBULATORY_CARE_PROVIDER_SITE_OTHER): Payer: BC Managed Care – PPO

## 2014-02-11 ENCOUNTER — Encounter: Payer: Self-pay | Admitting: Internal Medicine

## 2014-02-11 ENCOUNTER — Ambulatory Visit (INDEPENDENT_AMBULATORY_CARE_PROVIDER_SITE_OTHER): Payer: BC Managed Care – PPO | Admitting: Internal Medicine

## 2014-02-11 VITALS — BP 122/78 | HR 73 | Temp 98.2°F | Ht 62.0 in | Wt 192.2 lb

## 2014-02-11 DIAGNOSIS — Z0000 Encounter for general adult medical examination without abnormal findings: Secondary | ICD-10-CM

## 2014-02-11 DIAGNOSIS — R300 Dysuria: Secondary | ICD-10-CM

## 2014-02-11 DIAGNOSIS — E119 Type 2 diabetes mellitus without complications: Secondary | ICD-10-CM

## 2014-02-11 DIAGNOSIS — E559 Vitamin D deficiency, unspecified: Secondary | ICD-10-CM

## 2014-02-11 DIAGNOSIS — E669 Obesity, unspecified: Secondary | ICD-10-CM

## 2014-02-11 LAB — URINALYSIS, ROUTINE W REFLEX MICROSCOPIC
Bilirubin Urine: NEGATIVE
Ketones, ur: NEGATIVE
Leukocytes, UA: NEGATIVE
Nitrite: NEGATIVE
Specific Gravity, Urine: 1.015 (ref 1.000–1.030)
Total Protein, Urine: NEGATIVE
Urine Glucose: NEGATIVE
Urobilinogen, UA: 0.2 (ref 0.0–1.0)
WBC, UA: NONE SEEN (ref 0–?)
pH: 6 (ref 5.0–8.0)

## 2014-02-11 LAB — MICROALBUMIN / CREATININE URINE RATIO
Creatinine,U: 59.8 mg/dL
Microalb Creat Ratio: 0.2 mg/g (ref 0.0–30.0)
Microalb, Ur: 0.1 mg/dL (ref 0.0–1.9)

## 2014-02-11 LAB — HEMOGLOBIN A1C: Hgb A1c MFr Bld: 6.6 % — ABNORMAL HIGH (ref 4.6–6.5)

## 2014-02-11 LAB — VITAMIN D 25 HYDROXY (VIT D DEFICIENCY, FRACTURES): VITD: 28.18 ng/mL

## 2014-02-11 MED ORDER — OXICONAZOLE NITRATE 1 % EX LOTN: TOPICAL_LOTION | Freq: Two times a day (BID) | CUTANEOUS | Status: DC

## 2014-02-11 NOTE — Progress Notes (Signed)
Pre visit review using our clinic review tool, if applicable. No additional management support is needed unless otherwise documented below in the visit note. 

## 2014-02-11 NOTE — Assessment & Plan Note (Signed)
diet controlled - check a1c now - continue to work on weight, exercise and diet Lab Results  Component Value Date   HGBA1C 6.7* 01/09/2013

## 2014-02-11 NOTE — Progress Notes (Signed)
Subjective:    Patient ID: Grace Lyons, female    DOB: 04-25-1961, 53 y.o.   MRN: [id number]  HPI  patient is here today for annual physical. Patient feels well and has no complaints.  Also reviewed chronic medical issues and interval medical events  Past Medical History  Diagnosis Date  . VITAMIN D DEFICIENCY   . OBESITY   . Fibroid   . Cervical polyp   . Menometrorrhagia    Family History  Problem Relation Age of Onset  . Diabetes Mother   . Hypertension Mother   . Diabetes Father   . Hypertension Father    History  Substance Use Topics  . Smoking status: Former Smoker    Quit date: 01/12/1996  . Smokeless tobacco: Not on file     Comment: Single, lives alone. Works as Licensed conveyancer at [employer]. Has 58yo son at [place]  . Alcohol Use: No    Review of Systems  Constitutional: Negative for fatigue and unexpected weight change.  Respiratory: Negative for cough, shortness of breath and wheezing.   Cardiovascular: Negative for chest pain, palpitations and leg swelling.  Gastrointestinal: Negative for nausea, abdominal pain and diarrhea.  Genitourinary: Positive for dysuria.  Skin:       Recurrent fungal changes between toes (prior derm tx for same - request rx for samples from derm)  Neurological: Negative for dizziness, weakness, light-headedness and headaches.  Psychiatric/Behavioral: Negative for dysphoric mood. The patient is not nervous/anxious.   All other systems reviewed and are negative.      Objective:   Physical Exam  BP 122/78  Pulse 73  Temp(Src) 98.2 F (36.8 C) (Oral)  Ht 5\' 2"  (1.575 m)  Wt 192 lb 3.2 oz (87.181 kg)  BMI 35.14 kg/m2  SpO2 93% Wt Readings from Last 3 Encounters:  02/11/14 192 lb 3.2 oz (87.181 kg)  01/21/14 190 lb 6.4 oz (86.365 kg)  01/09/13 198 lb (89.812 kg)   Constitutional: She is obese, but appears well-developed and well-nourished. No distress.  HENT: Head: Normocephalic and atraumatic.  Ears: B TMs ok, no erythema or effusion; Nose: Nose normal. Mouth/Throat: Oropharynx is clear and moist. No oropharyngeal exudate.  Eyes: Conjunctivae and EOM are normal. Pupils are equal, round, and reactive to light. No scleral icterus.  Neck: Normal range of motion. Neck supple. No JVD present. No thyromegaly present.  Cardiovascular: Normal rate, regular rhythm and normal heart sounds.  No murmur heard. No BLE edema. Pulmonary/Chest: Effort normal and breath sounds normal. No respiratory distress. She has no wheezes.  Abdominal: Soft. Bowel sounds are normal. She exhibits no distension. There is no tenderness. no masses Musculoskeletal: Normal range of motion, no joint effusions. No gross deformities Neurological: She is alert and oriented to person, place, and time. No cranial nerve deficit. Coordination, balance, strength, speech and gait are normal.  Skin: Skin is warm and dry. No rash noted. No erythema. interdigital fungal changes at toes Psychiatric: She has a normal mood and affect. Her behavior is normal. Judgment and thought content normal.    Lab Results  Component Value Date   WBC 5.9 01/21/2014   HGB 13.1 01/21/2014   HCT 39.3 01/21/2014   PLT 253.0 01/21/2014   GLUCOSE 103* 01/21/2014   CHOL 137 01/21/2014   TRIG 39.0 01/21/2014   HDL 60.80 01/21/2014   LDLCALC 68 01/21/2014   ALT 25 01/21/2014   AST 20 01/21/2014   NA 138 01/21/2014   K  3.9 01/21/2014   CL 105 01/21/2014   CREATININE 0.7 01/21/2014   BUN 12 01/21/2014   CO2 26 01/21/2014   TSH 0.35 01/21/2014   HGBA1C 6.7* 01/09/2013   MICROALBUR 0.3 01/09/2013    Mm Digital Screening Bilateral  01/10/2014   CLINICAL DATA:  Screening.  EXAM: DIGITAL SCREENING BILATERAL MAMMOGRAM WITH CAD  COMPARISON:  Previous exam(s).  ACR Breast Density Category b: There are scattered areas of fibroglandular density.  FINDINGS: There are no findings suspicious for malignancy. Images were processed with CAD.  IMPRESSION: No mammographic evidence of malignancy. A  result letter of this screening mammogram will be mailed directly to the patient.  RECOMMENDATION: Screening mammogram in one year. (Code:[id number])  BI-RADS CATEGORY  1: Negative.   Electronically Signed   By: Duke Salvia M.D.   On: 01/10/2014 16:44       Assessment & Plan:   CPX/v70.0 - Patient has been counseled on age-appropriate routine health concerns for screening and prevention. These are reviewed and up-to-date. Immunizations are up-to-date or declined. Labs reviewed.  Problem List Items Addressed This Visit   DIABETES MELLITUS, TYPE II      diet controlled - check a1c now - continue to work on weight, exercise and diet Lab Results  Component Value Date   HGBA1C 6.7* 01/09/2013      Relevant Orders      Hemoglobin A1c      Microalbumin / creatinine urine ratio   OBESITY      Wt Readings from Last 3 Encounters:  02/11/14 192 lb 3.2 oz (87.181 kg)  01/21/14 190 lb 6.4 oz (86.365 kg)  01/09/13 198 lb (89.812 kg)   The patient is asked to make an attempt to improve diet and exercise patterns to aid in medical management of this problem.     VITAMIN D DEFICIENCY     Check level now - continue oral replacement    Relevant Orders      Vit D  25 hydroxy (rtn osteoporosis monitoring)    Other Visit Diagnoses   Routine general medical examination at a health care facility    -  Primary    Dysuria        Relevant Orders       Urinalysis, Routine w reflex microscopic

## 2014-02-11 NOTE — Assessment & Plan Note (Signed)
Check level now - continue oral replacement

## 2014-02-11 NOTE — Patient Instructions (Addendum)
It was good to see you today.  We have reviewed your prior records including labs and tests today  Health Maintenance reviewed - all recommended immunizations and age-appropriate screenings are up-to-date.  Test(s) ordered today. Your results will be released to [facility] (or called to you) after review, usually within 72hours after test completion. If any changes need to be made, you will be notified at that same time.  Medications reviewed and updated, no changes recommended at this time. Refill on medication(s) as discussed today.  Please schedule followup in 12 months for annual exam and labs, call sooner if problems.  Health Maintenance, Female A healthy lifestyle and preventative care can promote health and wellness.  Maintain regular health, dental, and eye exams.  Eat a healthy diet. Foods like vegetables, fruits, whole grains, low-fat dairy products, and lean protein foods contain the nutrients you need without too many calories. Decrease your intake of foods high in solid fats, added sugars, and salt. Get information about a proper diet from your caregiver, if necessary.  Regular physical exercise is one of the most important things you can do for your health. Most adults should get at least 150 minutes of moderate-intensity exercise (any activity that increases your heart rate and causes you to sweat) each week. In addition, most adults need muscle-strengthening exercises on 2 or more days a week.   Maintain a healthy weight. The body mass index (BMI) is a screening tool to identify possible weight problems. It provides an estimate of body fat based on height and weight. Your caregiver can help determine your BMI, and can help you achieve or maintain a healthy weight. For adults 20 years and older:  A BMI below 18.5 is considered underweight.  A BMI of 18.5 to 24.9 is normal.  A BMI of 25 to 29.9 is considered overweight.  A BMI of 30 and above is considered obese.  Maintain  normal blood lipids and cholesterol by exercising and minimizing your intake of saturated fat. Eat a balanced diet with plenty of fruits and vegetables. Blood tests for lipids and cholesterol should begin at age 79 and be repeated every 5 years. If your lipid or cholesterol levels are high, you are over 50, or you are a high risk for heart disease, you may need your cholesterol levels checked more frequently.Ongoing high lipid and cholesterol levels should be treated with medicines if diet and exercise are not effective.  If you smoke, find out from your caregiver how to quit. If you do not use tobacco, do not start.  Lung cancer screening is recommended for adults aged 73-80 years who are at high risk for developing lung cancer because of a history of smoking. Yearly low-dose computed tomography (CT) is recommended for people who have at least a 30-pack-year history of smoking and are a current smoker or have quit within the past 15 years. A pack year of smoking is smoking an average of 1 pack of cigarettes a day for 1 year (for example: 1 pack a day for 30 years or 2 packs a day for 15 years). Yearly screening should continue until the smoker has stopped smoking for at least 15 years. Yearly screening should also be stopped for people who develop a health problem that would prevent them from having lung cancer treatment.  If you are pregnant, do not drink alcohol. If you are breastfeeding, be very cautious about drinking alcohol. If you are not pregnant and choose to drink alcohol, do not exceed 1  drink per day. One drink is considered to be 12 ounces (355 mL) of beer, 5 ounces (148 mL) of wine, or 1.5 ounces (44 mL) of liquor.  Avoid use of street drugs. Do not share needles with anyone. Ask for help if you need support or instructions about stopping the use of drugs.  High blood pressure causes heart disease and increases the risk of stroke. Blood pressure should be checked at least every 1 to 2  years. Ongoing high blood pressure should be treated with medicines, if weight loss and exercise are not effective.  If you are 55 to 53 years old, ask your caregiver if you should take aspirin to prevent strokes.  Diabetes screening involves taking a blood sample to check your fasting blood sugar level. This should be done once every 3 years, after age 45, if you are within normal weight and without risk factors for diabetes. Testing should be considered at a younger age or be carried out more frequently if you are overweight and have at least 1 risk factor for diabetes.  Breast cancer screening is essential preventative care for women. You should practice "breast self-awareness." This means understanding the normal appearance and feel of your breasts and may include breast self-examination. Any changes detected, no matter how small, should be reported to a caregiver. Women in their 20s and 30s should have a clinical breast exam (CBE) by a caregiver as part of a regular health exam every 1 to 3 years. After age 40, women should have a CBE every year. Starting at age 40, women should consider having a mammogram (breast X-ray) every year. Women who have a family history of breast cancer should talk to their caregiver about genetic screening. Women at a high risk of breast cancer should talk to their caregiver about having an MRI and a mammogram every year.  Breast cancer gene (BRCA)-related cancer risk assessment is recommended for women who have family members with BRCA-related cancers. BRCA-related cancers include breast, ovarian, tubal, and peritoneal cancers. Having family members with these cancers may be associated with an increased risk for harmful changes (mutations) in the breast cancer genes BRCA1 and BRCA2. Results of the assessment will determine the need for genetic counseling and BRCA1 and BRCA2 testing.  The Pap test is a screening test for cervical cancer. Women should have a Pap test  starting at age 21. Between ages 21 and 29, Pap tests should be repeated every 2 years. Beginning at age 30, you should have a Pap test every 3 years as long as the past 3 Pap tests have been normal. If you had a hysterectomy for a problem that was not cancer or a condition that could lead to cancer, then you no longer need Pap tests. If you are between ages 65 and 70, and you have had normal Pap tests going back 10 years, you no longer need Pap tests. If you have had past treatment for cervical cancer or a condition that could lead to cancer, you need Pap tests and screening for cancer for at least 20 years after your treatment. If Pap tests have been discontinued, risk factors (such as a new sexual partner) need to be reassessed to determine if screening should be resumed. Some women have medical problems that increase the chance of getting cervical cancer. In these cases, your caregiver may recommend more frequent screening and Pap tests.  The human papillomavirus (HPV) test is an additional test that may be used for cervical cancer screening.   The HPV test looks for the virus that can cause the cell changes on the cervix. The cells collected during the Pap test can be tested for HPV. The HPV test could be used to screen women aged 30 years and older, and should be used in women of any age who have unclear Pap test results. After the age of 30, women should have HPV testing at the same frequency as a Pap test.  Colorectal cancer can be detected and often prevented. Most routine colorectal cancer screening begins at the age of 50 and continues through age 75. However, your caregiver may recommend screening at an earlier age if you have risk factors for colon cancer. On a yearly basis, your caregiver may provide home test kits to check for hidden blood in the stool. Use of a small camera at the end of a tube, to directly examine the colon (sigmoidoscopy or colonoscopy), can detect the earliest forms of  colorectal cancer. Talk to your caregiver about this at age 50, when routine screening begins. Direct examination of the colon should be repeated every 5 to 10 years through age 75, unless early forms of pre-cancerous polyps or small growths are found.  Hepatitis C blood testing is recommended for all people born from 1945 through 1965 and any individual with known risks for hepatitis C.  Practice safe sex. Use condoms and avoid high-risk sexual practices to reduce the spread of sexually transmitted infections (STIs). Sexually active women aged 25 and younger should be checked for Chlamydia, which is a common sexually transmitted infection. Older women with new or multiple partners should also be tested for Chlamydia. Testing for other STIs is recommended if you are sexually active and at increased risk.  Osteoporosis is a disease in which the bones lose minerals and strength with aging. This can result in serious bone fractures. The risk of osteoporosis can be identified using a bone density scan. Women ages 65 and over and women at risk for fractures or osteoporosis should discuss screening with their caregivers. Ask your caregiver whether you should be taking a calcium supplement or vitamin D to reduce the rate of osteoporosis.  Menopause can be associated with physical symptoms and risks. Hormone replacement therapy is available to decrease symptoms and risks. You should talk to your caregiver about whether hormone replacement therapy is right for you.  Use sunscreen. Apply sunscreen liberally and repeatedly throughout the day. You should seek shade when your shadow is shorter than you. Protect yourself by wearing long sleeves, pants, a wide-brimmed hat, and sunglasses year round, whenever you are outdoors.  Notify your caregiver of new moles or changes in moles, especially if there is a change in shape or color. Also notify your caregiver if a mole is larger than the size of a pencil eraser.  Stay  current with your immunizations. Document Released: 02/14/2011 Document Revised: 11/26/2012 Document Reviewed: 07/03/2013 Exit[employer] Patient Information 2015 [employer]. This information is not intended to replace advice given to you by your health care provider. Make sure you discuss any questions you have with your health care provider.  

## 2014-02-11 NOTE — Assessment & Plan Note (Signed)
Wt Readings from Last 3 Encounters:  02/11/14 192 lb 3.2 oz (87.181 kg)  01/21/14 190 lb 6.4 oz (86.365 kg)  01/09/13 198 lb (89.812 kg)   The patient is asked to make an attempt to improve diet and exercise patterns to aid in medical management of this problem.

## 2014-02-12 ENCOUNTER — Telehealth: Payer: Self-pay | Admitting: Certified Registered Nurse Anesthetist

## 2014-02-12 NOTE — Telephone Encounter (Signed)
Pt return call back gave her md response...Grace Lyons

## 2014-02-12 NOTE — Telephone Encounter (Signed)
Message copied by Oletta Lamas on Wed Feb 12, 2014 11:52 AM ------      Message from: Gwendolyn Grant A      Created: Tue Feb 11, 2014  9:18 PM       Please call pt as she has not check My Chart messages      UA now clear (on repeat clean catch - no UTI)      Vit D normal and DM well controlled      No changes recommended - thanks! ------

## 2014-02-12 NOTE — Telephone Encounter (Signed)
LVM to return our cal.

## 2014-05-30 ENCOUNTER — Other Ambulatory Visit (INDEPENDENT_AMBULATORY_CARE_PROVIDER_SITE_OTHER): Payer: BC Managed Care – PPO

## 2014-05-30 ENCOUNTER — Ambulatory Visit (INDEPENDENT_AMBULATORY_CARE_PROVIDER_SITE_OTHER): Payer: BC Managed Care – PPO | Admitting: Family Medicine

## 2014-05-30 ENCOUNTER — Encounter: Payer: Self-pay | Admitting: Family Medicine

## 2014-05-30 ENCOUNTER — Other Ambulatory Visit: Payer: BC Managed Care – PPO

## 2014-05-30 VITALS — BP 126/82 | HR 65 | Ht 62.0 in | Wt 191.0 lb

## 2014-05-30 DIAGNOSIS — M25461 Effusion, right knee: Secondary | ICD-10-CM

## 2014-05-30 DIAGNOSIS — M25561 Pain in right knee: Secondary | ICD-10-CM

## 2014-05-30 NOTE — Progress Notes (Signed)
[name] [address] [place], [place] [address] Phone: [phone] Subjective:    I'm seeing this patient by the request  of:  Gwendolyn Grant, MD   CC: Right knee pain and swelling  [id number] Grace Lyons is a 53 y.o. female coming in with complaint of right knee pain and swelling. Patient was seen by another orthopedic physician previously. Patient actually had left knee pain and was diagnosed with moderate osteoarthritic changes on MRI. Patient had corticosteroid injections and then unfortunately had Synvisc injections. Patient states that it completely help her right knee. Unfortunately patient continues to have pain on her left knee that is very similar. Patient did do a corticosteroid injection as well as Synvisc injections on the left knee but did not notice any significant improvement at all. Patient's last injection was approximately 2 weeks ago and since then has actually had increasing swelling and pain into his knee. Patient denies any fevers or chills or any abnormal weight loss but finds it difficult to actually extend her knee completely or even flex her knee completely. States that even ambulation is very painful. Patient raises severity pain is 6/10 in does wake her up at night. Patient has not had advanced imaging on this knee but at some point did have x-rays. These x-rays are at an outside facility and we do not have them available. Patient does not remember any true injury.     Past medical history, social, surgical and family history all reviewed in electronic medical record.   Review of Systems: No headache, visual changes, nausea, vomiting, diarrhea, constipation, dizziness, abdominal pain, skin rash, fevers, chills, night sweats, weight loss, swollen lymph nodes, body aches, joint swelling, muscle aches, chest pain, shortness of breath, mood changes.   Objective Blood pressure 126/82, pulse 65, height 5\' 2"  (1.575 m), weight 191  lb (86.637 kg), SpO2 97.00%.  General: No apparent distress alert and oriented x3 mood and affect normal, dressed appropriately. obese HEENT: Pupils equal, extraocular movements intact  Respiratory: Patient's speak in full sentences and does not appear short of breath  Cardiovascular: No lower extremity edema, non tender, no erythema  Skin: Warm dry intact with no signs of infection or rash on extremities or on axial skeleton.  Abdomen: Soft nontender  Neuro: Cranial nerves II through XII are intact, neurovascularly intact in all extremities with 2+ DTRs and 2+ pulses.  Lymph: No lymphadenopathy of posterior or anterior cervical chain or axillae bilaterally.  Gait antalgic gait secondary to pain MSK:  Non tender with full range of motion and good stability and symmetric strength and tone of shoulders, elbows, wrist, hip, and ankles bilaterally.  Knee: Of right knee Effusion noted  Generalized tenderness of the knee mostly on the lateral joint line ROM is lacking the last 15 of flexion at last 5 of extension n. Ligaments with solid consistent endpoints including ACL, PCL, LCL, MCL. Negative Mcmurray's, Apley's, and Thessalonian tests. Non painful patellar compression. Patellar glide with minimal crepitus. Patellar and quadriceps tendons unremarkable. Hamstring and quadriceps strength is normal.  Contralateral knee does have some crepitus and is minimally tender to palpation but no significant pain or swelling.  MSK US performed of: Right knee This study was ordered, performed, and interpreted by Charlann Boxer D.O.  Knee: All structures visualized. Patient does have an effusion as well as the synovitis Anteromedial, anterolateral, posteromedial, and posterolateral menisci unremarkable without tearing, fraying, effusion, or displacement. Patient does have mild to moderate narrowing of  the medial and lateral joint lines. Patellar Tendon unremarkable on long and transverse views without  effusion. No abnormality of prepatellar bursa. LCL and MCL unremarkable on long and transverse views. No abnormality of origin of medial or lateral head of the gastrocnemius.  IMPRESSION:  Significant effusion and synovitis.  Procedure: Real-time Ultrasound Guided aspiration and Injection of right knee Device: GE Logiq E  Ultrasound guided injection is preferred based studies that show increased duration, increased effect, greater accuracy, decreased procedural pain, increased response rate, and decreased cost with ultrasound guided versus blind injection.  Verbal informed consent obtained.  Time-out conducted.  Noted no overlying erythema, induration, or other signs of local infection.  Skin prepped in a sterile fashion.  Local anesthesia: Topical Ethyl chloride.  With sterile technique and under real time ultrasound guidance: With a 22-gauge 2 inch needle patient was injected with 4 cc of 0.5% Marcaine and removal of 30 cc of straw light-colored fluid that does seem to be somewhat thick 1 cc of Kenalog 40 mg/dL. This was from a superior lateral approach.  Completed without difficulty  Pain immediately resolved suggesting accurate placement of the medication.  Advised to call if fevers/chills, erythema, induration, drainage, or persistent bleeding.  Images permanently stored and available for review in the ultrasound unit.  Impression: Technically successful ultrasound guided aspiration injection.       Impression and Recommendations:     This case required medical decision making of moderate complexity.

## 2014-05-30 NOTE — Assessment & Plan Note (Signed)
Patient did have a right knee effusion. Patient did have drainage of 20 cc of straw-colored fluid removed but this was fairly thick. We will send her to lab for further evaluation. No signs of infection noted today. Patient did respond well to corticosteroid injection and was given a brace the ankle be helpful. Discussed with patient that I would like x-rays as well as MRI from the outside facility I will be beneficial. Patient will try to get me these. Patient given home exercise program as well as will wear the brace and we discussed over-the-counter medications he can be beneficial and patient was given a trial of topical anti-inflammatories. Patient and will come back and see me again in 3-4 weeks for further evaluation. Patient continues to have difficulty we may need to consider an MRI of his right knee for further evaluation especially if there is a recurrent effusion.

## 2014-05-30 NOTE — Patient Instructions (Signed)
Good to meet you Ice 20 minutes 2 times daily. Usually after activity and before bed. Exercises 3 times a week.  Turmeric 500mg  twice daily Vitamin D 2000 IU daily.  Come back in 2 weeks to make sure you are doing better.

## 2014-05-31 LAB — SYNOVIAL CELL COUNT + DIFF, W/ CRYSTALS
Crystals, Fluid: NONE SEEN
Eosinophils-Synovial: 0 % (ref 0–1)
Lymphocytes-Synovial Fld: 83 % — ABNORMAL HIGH (ref 0–20)
Monocyte/Macrophage: 17 % — ABNORMAL LOW (ref 50–90)
Neutrophil, Synovial: 0 % (ref 0–25)
WBC, Synovial: 270 cu mm — ABNORMAL HIGH (ref 0–200)

## 2014-06-16 ENCOUNTER — Encounter: Payer: Self-pay | Admitting: Family Medicine

## 2014-06-20 ENCOUNTER — Ambulatory Visit (INDEPENDENT_AMBULATORY_CARE_PROVIDER_SITE_OTHER): Payer: BC Managed Care – PPO | Admitting: Family Medicine

## 2014-06-20 ENCOUNTER — Encounter: Payer: Self-pay | Admitting: Family Medicine

## 2014-06-20 VITALS — BP 128/72 | HR 85 | Ht 62.0 in | Wt 189.0 lb

## 2014-06-20 DIAGNOSIS — R269 Unspecified abnormalities of gait and mobility: Secondary | ICD-10-CM | POA: Insufficient documentation

## 2014-06-20 DIAGNOSIS — M2141 Flat foot [pes planus] (acquired), right foot: Secondary | ICD-10-CM | POA: Insufficient documentation

## 2014-06-20 DIAGNOSIS — M216X9 Other acquired deformities of unspecified foot: Secondary | ICD-10-CM | POA: Insufficient documentation

## 2014-06-20 DIAGNOSIS — M25461 Effusion, right knee: Secondary | ICD-10-CM

## 2014-06-20 DIAGNOSIS — M2142 Flat foot [pes planus] (acquired), left foot: Secondary | ICD-10-CM

## 2014-06-20 NOTE — Progress Notes (Signed)
[name] [address] [place], [place] [address] Phone: [phone] Subjective:     CC: Right knee pain and swelling follow-upnew problem bilateral for pain  [id number] Grace Lyons is a 53 y.o. female coming in with complaint of right knee pain and swelling. Patient was seen by another orthopedic physician previously. Patient actually had left knee pain and was diagnosed with moderate osteoarthritic changes on MRI. Patient had corticosteroid injections and then unfortunately had Synvisc injections. Patient states that it completely help her right knee. Unfortunately patient continues to have pain on her left knee that is very similar. Patient did do a corticosteroid injection as well as Synvisc injections on the left knee but did not notice any significant improvement at all.   Patient did see me 2 weeks ago and was found to have a very large synovitis in the knee. We elected to tricortical steroid injection and home exercises as well as bracing. We discussed about over-the-counter medications or could be beneficial as well. Patient states she is feeling significantly better. Patient is not really having any knee pain overall.  Patient is having a new problem patient is having bilateral foot pain with locking. Patient describes it is more of a dull aching sensation. Patient has seen other providers for before and has been given over-the-counter orthotics and shoes but has not notice any significant improvement. Patient states the more she walks the more discomfort she has. Describes it as a throbbing sensation at night. Denies any swelling or numbness. Does not remember any true injury.    Past medical history, social, surgical and family history all reviewed in electronic medical record.   Review of Systems: No headache, visual changes, nausea, vomiting, diarrhea, constipation, dizziness, abdominal pain, skin rash, fevers, chills, night sweats, weight loss,  swollen lymph nodes, body aches, joint swelling, muscle aches, chest pain, shortness of breath, mood changes.   Objective Blood pressure 128/72, pulse 85, height 5\' 2"  (1.575 m), weight 189 lb (85.73 kg), SpO2 97 %.  General: No apparent distress alert and oriented x3 mood and affect normal, dressed appropriately. obese HEENT: Pupils equal, extraocular movements intact  Respiratory: Patient's speak in full sentences and does not appear short of breath  Cardiovascular: No lower extremity edema, non tender, no erythema  Skin: Warm dry intact with no signs of infection or rash on extremities or on axial skeleton.  Abdomen: Soft nontender  Neuro: Cranial nerves II through XII are intact, neurovascularly intact in all extremities with 2+ DTRs and 2+ pulses.  Lymph: No lymphadenopathy of posterior or anterior cervical chain or axillae bilaterally.  Gait antalgic gait still present MSK:  Non tender with full range of motion and good stability and symmetric strength and tone of shoulders, elbows, wrist, hip, and ankles bilaterally.  Knee:right knee No effusion nontender on exam Near full range of motion lacking the last 3 of extension Ligaments with solid consistent endpoints including ACL, PCL, LCL, MCL. Negative Mcmurray's, Apley's, and Thessalonian tests. Non painful patellar compression. Patellar glide with minimal crepitus. Patellar and quadriceps tendons unremarkable. Hamstring and quadriceps strength is normal.  Contralateral knee does have some crepitus and is minimally tender to palpation but no significant pain or swelling.significant improvement from previous exam  Foot exam shows the patient does have severe pes planus bilaterally with mild over pronation of the hindfoot as well as a wide midfoot. Breakdown of the transverse and longitudinal arch noted bilaterally     Impression and Recommendations:  This case required medical decision making of moderate complexity.

## 2014-06-20 NOTE — Assessment & Plan Note (Signed)
Patient is doing very well after critical steroid injection. Patient's aspiration and lab values showed no significant inflammatory response. This is likely secondary to the osteoarthritis and possibly a postviral secondary to the enlargement of the lymphocytes. At this point we will continue to monitor and continue with conservative therapy including range of motion and icing. Patient can follow up for this problem in 4-6 weeks.

## 2014-06-20 NOTE — Assessment & Plan Note (Signed)
Patient does have pes planus bilaterally that is likely contributing especially with breakdown of the longitudinal and the transverse arch. This is given patient is somewhat of a abnormality of gait. We discussed about shoes as well as over-the-counter orthotics and an icing protocol. Patient at home exercise program for strengthening exercises. Patient will come back if she would like custom orthotics. Otherwise patient will follow-up in 4-6 weeks if she is improving and we will discuss further treatment.

## 2014-06-20 NOTE — Patient Instructions (Addendum)
Good to see you Grace Lyons is your friend.   I am glad you knee is doing better Ice bath at night for 15 minutes.  Call insurance ask about custom orthotics for pes planus and abnormality of gait.  Otherwise consider [employer] orthotics online and look for total support.  Exercises 3 times a week.  Come back if you would like custom orthotics.

## 2014-06-24 ENCOUNTER — Telehealth: Payer: Self-pay | Admitting: *Deleted

## 2014-06-24 NOTE — Telephone Encounter (Signed)
Spoke to pt & gave her the billing codes for orthotics.

## 2014-06-24 NOTE — Telephone Encounter (Signed)
Left msg on triage requesting to spwak with lindsay concerning orthotics pads.Marland KitchenJohny Lyons

## 2014-07-15 ENCOUNTER — Telehealth: Payer: Self-pay | Admitting: Internal Medicine

## 2014-07-15 NOTE — Telephone Encounter (Signed)
error 

## 2014-07-16 ENCOUNTER — Telehealth: Payer: Self-pay | Admitting: *Deleted

## 2014-07-16 NOTE — Telephone Encounter (Signed)
[facility] Patient Name: Grace Lyons Gender: Female DOB: [birth year] Age: 53 Y 10 M 19 D Return Phone Number: [phone] (Primary), [phone] (Secondary) Address: [address] City/State/Zip: [place] [address] Client [facility] - Client Client Site [facility] - Day Physician Gwendolyn Grant Contact Type Call Call Type Triage / Clinical Relationship To Patient Self Return Phone Number [phone] (Primary) Chief Complaint Cough Initial Comment Caller states she was having flu like sx. Pharmacist advised to take thermaflu and mucinex. Everything cleared up except now she feels like she may have bronchitis, coughing. PreDisposition Home Care Nurse Assessment Nurse: Markus Daft, RN, Sherre Poot Date/Time Eilene Ghazi Time): 07/15/2014 11:57:14 AM Confirm and document reason for call. If symptomatic, describe symptoms. ---Caller states she was having flu like sx with productive cough, headaches, fever, and body aches. Pharmacist advised to take thermaflu and mucinex. Everything cleared up except now she feels like she may have bronchitis as still with coughing. No fever now. Has the patient traveled out of the country within the last 30 days? ---Not Applicable Does the patient require triage? ---Yes Related visit to physician within the last 2 weeks? ---No Does the PT have any chronic conditions? (i.e. diabetes, asthma, etc.) ---No Did the patient indicate they were pregnant? ---No Guidelines Guideline Title Affirmed Question Affirmed Notes Nurse Date/Time (Eastern Time) Cough - Acute Productive ALSO, mild central chest pain occurs only when coughing Markus Daft, RN, [facility] 07/15/2014 11:57:59 AM Disp. Time Eilene Ghazi Time) Disposition Final User 07/15/2014 12:03:04 PM [place], RN, Sherre Poot Caller Understands: Yes Disagree/Comply: Comply PLEASE NOTE: All  timestamps contained within this report are represented as [place] Standard Time. CONFIDENTIALTY NOTICE: This fax transmission is intended only for the addressee. It contains information that is legally privileged, confidential or otherwise protected from use or disclosure. If you are not the intended recipient, you are strictly prohibited from reviewing, disclosing, copying using or disseminating any of this information or taking any action in reliance on or regarding this information. If you have received this fax in error, please notify us immediately by telephone so that we can arrange for its return to [place]. Phone: [phone], Toll-Free: [phone], Fax: [phone] Page: 2 of 2 Call Id: [id number] Care Advice Given Per Guideline HOME CARE: You should be able to treat this at home. REASSURANCE: * It doesn't sound like a serious cough. * Coughing up mucus is very important for protecting the lungs from pneumonia. COUGH MEDICINES: - OTC COUGH SYRUPS: The most common cough suppressant in OTC cough medications is dextromethorphan. Often the letters 'DM' appear in the name. - OTC COUGH DROPS: Cough drops can help a lot, especially for mild coughs. They reduce coughing by soothing your irritated throat and removing that tickle sensation in the back of the throat. Cough drops also have the advantage of portability - you can carry them with you. - HOME REMEDY - HARD CANDY: Hard candy works just as well as medicine-flavored OTC cough drops. Diabetics should use sugar-free candy. - HOME REMEDY - HONEY: This old home remedy has been shown to help decrease coughing at night. The adult dosage is 2 teaspoons (10 ml) at bedtime. Honey should not be given to infants under one year of age. OTC COUGH SYRUP - DEXTROMETHORPHAN: * Cough syrups containing the cough suppressant dextromethorphan (DM) may help decrease your cough. Cough syrups work best for coughs that keep you awake at night. They can  also sometimes  help in the late stages of a respiratory infection when the cough is dry and hacking. They can be used along with cough drops. * Read the package instructions for dosage, contraindications, and other important information. HUMIDIFIER: If the air is dry, use a humidifier in the bedroom. (Reason: dry air makes coughs worse) AVOID TOBACCO SMOKE: Smoking or being exposed to smoke makes coughs much worse. PREVENT DEHYDRATION: * Drink adequate liquids. * This will help soothe an irritated or dry throat and loosen up the phlegm. EXPECTED COURSE: Viral bronchitis causes a cough for 1 to 3 weeks. Sometimes you may cough up lots of phlegm (mucus). The mucus can normally be white, gray, yellow or green. PAIN MEDICINES: * For pain relief, take acetaminophen, ibuprofen, or naproxen. EXTRA NOTES: * Before taking any medicine, read all the instructions on the package. CALL BACK IF: * Chest pain increases or becomes constant. * Difficulty breathing occurs * Fever over 103 F (39.4 C), or fever last over 3 days * You become worse. CALL BACK IF: * Cough lasts over 3 weeks * Continuous coughing persists over 2 hours after cough treatment * Difficulty breathing occurs * Fever over 103 F (39.4 C) * Fever lasts over 3 days * You become worse. CARE ADVICE given per Cough - Acute Productive (Adult) guideline.

## 2014-07-28 ENCOUNTER — Telehealth: Payer: Self-pay | Admitting: *Deleted

## 2014-07-28 NOTE — Telephone Encounter (Signed)
[facility] Patient Name: Grace Lyons Gender: Female DOB: November 13, 1960 Age: 53 Y 10 M 19 D Return Phone Number: [phone] (Primary), [phone] (Secondary) Address: [address] City/State/Zip: [place] [address] Client [facility] - Client Client Site [facility] - Day Physician Gwendolyn Grant Contact Type Call Call Type Triage / Clinical Relationship To Patient Self Return Phone Number [phone] (Primary) Chief Complaint Cough Initial Comment Caller states she was having flu like sx. Pharmacist advised to take thermaflu and mucinex. Everything cleared up except now she feels like she may have bronchitis, coughing. PreDisposition Home Care Nurse Assessment Nurse: Markus Daft, RN, Sherre Poot Date/Time Eilene Ghazi Time): 07/15/2014 11:57:14 AM Confirm and document reason for call. If symptomatic, describe symptoms. ---Caller states she was having flu like sx with productive cough, headaches, fever, and body aches. Pharmacist advised to take thermaflu and mucinex. Everything cleared up except now she feels like she may have bronchitis as still with coughing. No fever now. Has the patient traveled out of the country within the last 30 days? ---Not Applicable Does the patient require triage? ---Yes Related visit to physician within the last 2 weeks? ---No Does the PT have any chronic conditions? (i.e. diabetes, asthma, etc.) ---No Did the patient indicate they were pregnant? ---No Guidelines Guideline Title Affirmed Question Affirmed Notes Nurse Date/Time (Eastern Time) Cough - Acute Productive ALSO, mild central chest pain occurs only when coughing Markus Daft, RN, [facility] 07/15/2014 11:57:59 AM Disp. Time Eilene Ghazi Time) Disposition Final User 07/15/2014 12:03:04 PM Maury, RN, Sherre Poot Caller Understands: Yes Disagree/Comply: Comply PLEASE NOTE: All  timestamps contained within this report are represented as [place] Standard Time. CONFIDENTIALTY NOTICE: This fax transmission is intended only for the addressee. It contains information that is legally privileged, confidential or otherwise protected from use or disclosure. If you are not the intended recipient, you are strictly prohibited from reviewing, disclosing, copying using or disseminating any of this information or taking any action in reliance on or regarding this information. If you have received this fax in error, please notify us immediately by telephone so that we can arrange for its return to [place]. Phone: [phone], Toll-Free: [phone], Fax: [phone] Page: 2 of 2 Call Id: [id number] Care Advice Given Per Guideline HOME CARE: You should be able to treat this at home. REASSURANCE: * It doesn't sound like a serious cough. * Coughing up mucus is very important for protecting the lungs from pneumonia. COUGH MEDICINES: - OTC COUGH SYRUPS: The most common cough suppressant in OTC cough medications is dextromethorphan. Often the letters 'DM' appear in the name. - OTC COUGH DROPS: Cough drops can help a lot, especially for mild coughs. They reduce coughing by soothing your irritated throat and removing that tickle sensation in the back of the throat. Cough drops also have the advantage of portability - you can carry them with you. - HOME REMEDY - HARD CANDY: Hard candy works just as well as medicine-flavored OTC cough drops. Diabetics should use sugar-free candy. - HOME REMEDY - HONEY: This old home remedy has been shown to help decrease coughing at night. The adult dosage is 2 teaspoons (10 ml) at bedtime. Honey should not be given to infants under one year of age. OTC COUGH SYRUP - DEXTROMETHORPHAN: * Cough syrups containing the cough suppressant dextromethorphan (DM) may help decrease your cough. Cough syrups work best for coughs that keep you awake at night. They can  also sometimes  help in the late stages of a respiratory infection when the cough is dry and hacking. They can be used along with cough drops. * Read the package instructions for dosage, contraindications, and other important information. HUMIDIFIER: If the air is dry, use a humidifier in the bedroom. (Reason: dry air makes coughs worse) AVOID TOBACCO SMOKE: Smoking or being exposed to smoke makes coughs much worse. PREVENT DEHYDRATION: * Drink adequate liquids. * This will help soothe an irritated or dry throat and loosen up the phlegm. EXPECTED COURSE: Viral bronchitis causes a cough for 1 to 3 weeks. Sometimes you may cough up lots of phlegm (mucus). The mucus can normally be white, gray, yellow or green. PAIN MEDICINES: * For pain relief, take acetaminophen, ibuprofen, or naproxen. EXTRA NOTES: * Before taking any medicine, read all the instructions on the package. CALL BACK IF: * Chest pain increases or becomes constant. * Difficulty breathing occurs * Fever over 103 F (39.4 C), or fever last over 3 days * You become worse. CALL BACK IF: * Cough lasts over 3 weeks * Continuous coughing persists over 2 hours after cough treatment * Difficulty breathing occurs * Fever over 103 F (39.4 C) * Fever lasts over 3 days * You become worse. CARE ADVICE given per Cough - Acute Productive (Adult) guideline.

## 2014-09-02 ENCOUNTER — Ambulatory Visit: Payer: Self-pay | Admitting: Family

## 2014-11-17 ENCOUNTER — Other Ambulatory Visit: Payer: Self-pay

## 2014-11-17 DIAGNOSIS — Z1231 Encounter for screening mammogram for malignant neoplasm of breast: Secondary | ICD-10-CM

## 2014-11-26 ENCOUNTER — Ambulatory Visit (INDEPENDENT_AMBULATORY_CARE_PROVIDER_SITE_OTHER): Payer: BLUE CROSS/BLUE SHIELD | Admitting: Family Medicine

## 2014-11-26 ENCOUNTER — Encounter: Payer: Self-pay | Admitting: Family Medicine

## 2014-11-26 VITALS — BP 120/78 | HR 73 | Ht 62.0 in | Wt 190.0 lb

## 2014-11-26 DIAGNOSIS — M2142 Flat foot [pes planus] (acquired), left foot: Secondary | ICD-10-CM | POA: Diagnosis not present

## 2014-11-26 DIAGNOSIS — M174 Other bilateral secondary osteoarthritis of knee: Secondary | ICD-10-CM | POA: Diagnosis not present

## 2014-11-26 DIAGNOSIS — M2141 Flat foot [pes planus] (acquired), right foot: Secondary | ICD-10-CM

## 2014-11-26 NOTE — Patient Instructions (Addendum)
Good to see you Get labs when you can Continue the shoes We will get you set up for orthotics New exercises for IT band today and do 3 times a week See me again in 3-4 weeks.

## 2014-11-26 NOTE — Progress Notes (Signed)
Pre visit review using our clinic review tool, if applicable. No additional management support is needed unless otherwise documented below in the visit note. 

## 2014-11-26 NOTE — Assessment & Plan Note (Signed)
Patient given ultrasound guided injections in the knee secondary to body habitus today. Patient did respond well to the injections. Patient has already failed Synvisc injections previously. Patient had these done in December. In addition of this patient will be put in custom orthotics to try to help with alignment. 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 We discussed bracing which patient has never found comfortable. Patient is young and does not what have any surgical intervention.

## 2014-11-26 NOTE — Progress Notes (Signed)
[name] [address], [place] [address] Phone: [phone] Subjective:     CC: Right knee pain and swelling follow-upnew problem bilateral for pain  YIR:[id number] Grace Lyons is a 54 y.o. female coming in with complaint of right knee pain and swelling. Patient was seen by another orthopedic physician previously. Patient actually had left knee pain and was diagnosed with moderate osteoarthritic changes on MRI. Patient had corticosteroid injections and then unfortunately had Synvisc injections.  Patient is having pain in both knees. Patient states that recently they have become significantly worse. Stopping her from going up and down stairs due to pain. Sometimes can even keep her up at night.    Patient is having is having bilateral foot pain.  States since last time and has made some mild improvement but continues to have some difficulty. Patient states a lot of walking can cause a lot of pain and then some spasming at night. Denies any numbness or tingling.    Past medical history, social, surgical and family history all reviewed in electronic medical record.   Review of Systems: No headache, visual changes, nausea, vomiting, diarrhea, constipation, dizziness, abdominal pain, skin rash, fevers, chills, night sweats, weight loss, swollen lymph nodes, body aches, joint swelling, muscle aches, chest pain, shortness of breath, mood changes.   Objective Blood pressure 120/78, weight 190 lb (86.183 kg).  General: No apparent distress alert and oriented x3 mood and affect normal, dressed appropriately. obese HEENT: Pupils equal, extraocular movements intact  Respiratory: Patient's speak in full sentences and does not appear short of breath  Cardiovascular: No lower extremity edema, non tender, no erythema  Skin: Warm dry intact with no signs of infection or rash on extremities or on axial skeleton.  Abdomen: Soft nontender  Neuro: Cranial nerves II  through XII are intact, neurovascularly intact in all extremities with 2+ DTRs and 2+ pulses.  Lymph: No lymphadenopathy of posterior or anterior cervical chain or axillae bilaterally.  Gait antalgic gait still present MSK:  Non tender with full range of motion and good stability and symmetric strength and tone of shoulders, elbows, wrist, hip, and ankles bilaterally.  Knee: bilateral Trace effusion bilaterally Near full range of motion lacking the last 3 of extension tender to palpation over the medial and lateral joint lines Ligaments with solid consistent endpoints including ACL, PCL, LCL, MCL. Negative Mcmurray's, Apley's, and Thessalonian tests. painful patellar compression. Patellar glide with minimal crepitus. Patellar and quadriceps tendons unremarkable. Hamstring and quadriceps strength is normal.    Foot exam shows the patient does have severe pes planus bilaterally with mild over pronation of the hindfoot as well as a wide midfoot. Breakdown of the transverse and longitudinal arch noted bilaterally  Procedure: Real-time Ultrasound Guided Injection of right knee Device: GE Logiq E  Ultrasound guided injection is preferred based studies that show increased duration, increased effect, greater accuracy, decreased procedural pain, increased response rate, and decreased cost with ultrasound guided versus blind injection.  Verbal informed consent obtained.  Time-out conducted.  Noted no overlying erythema, induration, or other signs of local infection.  Skin prepped in a sterile fashion.  Local anesthesia: Topical Ethyl chloride.  With sterile technique and under real time ultrasound guidance: With a 22-gauge 2 inch needle patient was injected with 4 cc of 0.5% Marcaine and 1 cc of Kenalog 40 mg/dL. This was from a superior lateral approach.  Completed without difficulty  Pain immediately resolved suggesting accurate placement of the  medication.  Advised to call if fevers/chills,  erythema, induration, drainage, or persistent bleeding.  Images permanently stored and available for review in the ultrasound unit.  Impression: Technically successful ultrasound guided injection.   Procedure: Real-time Ultrasound Guided Injection of left knee Device: GE Logiq E  Ultrasound guided injection is preferred based studies that show increased duration, increased effect, greater accuracy, decreased procedural pain, increased response rate, and decreased cost with ultrasound guided versus blind injection.  Verbal informed consent obtained.  Time-out conducted.  Noted no overlying erythema, induration, or other signs of local infection.  Skin prepped in a sterile fashion.  Local anesthesia: Topical Ethyl chloride.  With sterile technique and under real time ultrasound guidance: With a 22-gauge 2 inch needle patient was injected with 4 cc of 0.5% Marcaine and 1 cc of Kenalog 40 mg/dL. This was from a superior lateral approach.  Completed without difficulty  Pain immediately resolved suggesting accurate placement of the medication.  Advised to call if fevers/chills, erythema, induration, drainage, or persistent bleeding.  Images permanently stored and available for review in the ultrasound unit.  Impression: Technically successful ultrasound guided injection.     Impression and Recommendations:     This case required medical decision making of moderate complexity.

## 2014-11-26 NOTE — Assessment & Plan Note (Signed)
Patient will actually be put in custom orthotics for the oversupination and overpronation. Patient come back and see me again soon.

## 2014-12-03 ENCOUNTER — Encounter: Payer: Self-pay | Admitting: Family Medicine

## 2014-12-03 ENCOUNTER — Ambulatory Visit (INDEPENDENT_AMBULATORY_CARE_PROVIDER_SITE_OTHER): Payer: BLUE CROSS/BLUE SHIELD | Admitting: Family Medicine

## 2014-12-03 DIAGNOSIS — M2142 Flat foot [pes planus] (acquired), left foot: Secondary | ICD-10-CM

## 2014-12-03 DIAGNOSIS — M216X9 Other acquired deformities of unspecified foot: Secondary | ICD-10-CM

## 2014-12-03 DIAGNOSIS — M2141 Flat foot [pes planus] (acquired), right foot: Secondary | ICD-10-CM

## 2014-12-03 NOTE — Assessment & Plan Note (Signed)
Vision assessment pes planus of the feet bilaterally and patient was put in custom orthotics. Am hoping that this will help out her knee pain as well. We discussed slowly increasing the wear and will come back in 2-4 weeks for further evaluation. Please see patient instructions.

## 2014-12-03 NOTE — Patient Instructions (Signed)

## 2014-12-03 NOTE — Progress Notes (Signed)
Patient was fitted for a : standard, cushioned, semi-rigid orthotic. The orthotic was heated and afterward the patient was in a seated position and the orthotic molded. The patient was positioned in subtalar neutral position and 10 degrees of ankle dorsiflexion in a non-weight bearing stance. After completion of molding, patient did have orthotic management which included instructions on acclimating to the orthotics, signs of ill fit as well as care for the orthotic.  I spent 34minutes with the patient fitting the orthotics, discussing fit into her other shoes, wear patterns, signs of ill fit and proper care of the orthotics.   The blank was ground to a stable position for weight bearing. Size: 8 (Igli Comfort)  Base: Carbon fiber Additional Posting and Padding: The following postings were fitted onto the molded orthotics to help maintain a talar neutral position - Wedge posting for transverse arch: None      Silicone posting for longitudinal arch: 250/100 x2 bilaterally  The patient ambulated these, and they were very comfortable and supportive.

## 2014-12-24 ENCOUNTER — Ambulatory Visit: Payer: BLUE CROSS/BLUE SHIELD | Admitting: Family Medicine

## 2014-12-31 ENCOUNTER — Ambulatory Visit: Payer: BLUE CROSS/BLUE SHIELD | Admitting: Family Medicine

## 2015-01-13 ENCOUNTER — Ambulatory Visit
Admission: RE | Admit: 2015-01-13 | Discharge: 2015-01-13 | Disposition: A | Discharge status: Home / Self Care | Payer: BLUE CROSS/BLUE SHIELD | Source: Ambulatory Visit

## 2015-01-13 DIAGNOSIS — Z1231 Encounter for screening mammogram for malignant neoplasm of breast: Secondary | ICD-10-CM

## 2015-01-27 ENCOUNTER — Ambulatory Visit (INDEPENDENT_AMBULATORY_CARE_PROVIDER_SITE_OTHER): Payer: BLUE CROSS/BLUE SHIELD | Admitting: Internal Medicine

## 2015-01-27 ENCOUNTER — Other Ambulatory Visit (INDEPENDENT_AMBULATORY_CARE_PROVIDER_SITE_OTHER): Payer: BLUE CROSS/BLUE SHIELD

## 2015-01-27 ENCOUNTER — Encounter: Payer: Self-pay | Admitting: Internal Medicine

## 2015-01-27 VITALS — BP 122/82 | HR 70 | Temp 98.4°F | Resp 16 | Ht 62.0 in | Wt 187.0 lb

## 2015-01-27 DIAGNOSIS — E119 Type 2 diabetes mellitus without complications: Secondary | ICD-10-CM

## 2015-01-27 DIAGNOSIS — Z0000 Encounter for general adult medical examination without abnormal findings: Secondary | ICD-10-CM | POA: Diagnosis not present

## 2015-01-27 DIAGNOSIS — E559 Vitamin D deficiency, unspecified: Secondary | ICD-10-CM | POA: Diagnosis not present

## 2015-01-27 LAB — COMPREHENSIVE METABOLIC PANEL
ALT: 23 U/L (ref 0–35)
AST: 18 U/L (ref 0–37)
Albumin: 4.5 g/dL (ref 3.5–5.2)
Alkaline Phosphatase: 58 U/L (ref 39–117)
BUN: 12 mg/dL (ref 6–23)
CO2: 27 mEq/L (ref 19–32)
Calcium: 10.2 mg/dL (ref 8.4–10.5)
Chloride: 103 mEq/L (ref 96–112)
Creatinine, Ser: 0.73 mg/dL (ref 0.40–1.20)
GFR: 106.68 mL/min (ref >60.00)
Glucose, Bld: 101 mg/dL — ABNORMAL HIGH (ref 70–99)
Potassium: 4 mEq/L (ref 3.5–5.1)
Sodium: 136 mEq/L (ref 135–145)
Total Bilirubin: 0.6 mg/dL (ref 0.2–1.2)
Total Protein: 7.2 g/dL (ref 6.0–8.3)

## 2015-01-27 LAB — LIPID PANEL
Cholesterol: 147 mg/dL (ref 0–200)
HDL: 60.4 mg/dL (ref >39.00)
LDL Cholesterol: 74 mg/dL (ref 0–99)
NonHDL: 86.6
Total CHOL/HDL Ratio: 2
Triglycerides: 63 mg/dL (ref 0.0–149.0)
VLDL: 12.6 mg/dL (ref 0.0–40.0)

## 2015-01-27 LAB — VITAMIN D 25 HYDROXY (VIT D DEFICIENCY, FRACTURES): VITD: 23.73 ng/mL — ABNORMAL LOW (ref 30.00–100.00)

## 2015-01-27 LAB — HEMOGLOBIN A1C: Hgb A1c MFr Bld: 6.2 % (ref 4.6–6.5)

## 2015-01-27 NOTE — Assessment & Plan Note (Signed)
Foot exam done, up to date on eye exam and performs regular screening. She has lost 5 pounds since last year and is working on increasing exercise. No complications. Checking HgA1c. Not on treatment at this time.

## 2015-01-27 NOTE — Assessment & Plan Note (Signed)
Checking vitamin D level today. Continue oral replacement.

## 2015-01-27 NOTE — Patient Instructions (Signed)
We will check the blood work today and call you back with the results.   Keep up the good work with taking care of your health.   Good luck with your son and it was a pleasure to meet you today.   Health Maintenance Adopting a healthy lifestyle and getting preventive care can go a long way to promote health and wellness. Talk with your health care provider about what schedule of regular examinations is right for you. This is a good chance for you to check in with your provider about disease prevention and staying healthy. In between checkups, there are plenty of things you can do on your own. Experts have done a lot of research about which lifestyle changes and preventive measures are most likely to keep you healthy. Ask your health care provider for more information. WEIGHT AND DIET  Eat a healthy diet  Be sure to include plenty of vegetables, fruits, low-fat dairy products, and lean protein.  Do not eat a lot of foods high in solid fats, added sugars, or salt.  Get regular exercise. This is one of the most important things you can do for your health.  Most adults should exercise for at least 150 minutes each week. The exercise should increase your heart rate and make you sweat (moderate-intensity exercise).  Most adults should also do strengthening exercises at least twice a week. This is in addition to the moderate-intensity exercise.  Maintain a healthy weight  Body mass index (BMI) is a measurement that can be used to identify possible weight problems. It estimates body fat based on height and weight. Your health care provider can help determine your BMI and help you achieve or maintain a healthy weight.  For females 68 years of age and older:   A BMI below 18.5 is considered underweight.  A BMI of 18.5 to 24.9 is normal.  A BMI of 25 to 29.9 is considered overweight.  A BMI of 30 and above is considered obese.  Watch levels of cholesterol and blood lipids  You should  start having your blood tested for lipids and cholesterol at 54 years of age, then have this test every 5 years.  You may need to have your cholesterol levels checked more often if:  Your lipid or cholesterol levels are high.  You are older than 54 years of age.  You are at high risk for heart disease.  CANCER SCREENING   Lung Cancer  Lung cancer screening is recommended for adults 38-35 years old who are at high risk for lung cancer because of a history of smoking.  A yearly low-dose CT scan of the lungs is recommended for people who:  Currently smoke.  Have quit within the past 15 years.  Have at least a 30-pack-year history of smoking. A pack year is smoking an average of one pack of cigarettes a day for 1 year.  Yearly screening should continue until it has been 15 years since you quit.  Yearly screening should stop if you develop a health problem that would prevent you from having lung cancer treatment.  Breast Cancer  Practice breast self-awareness. This means understanding how your breasts normally appear and feel.  It also means doing regular breast self-exams. Let your health care provider know about any changes, no matter how small.  If you are in your 20s or 30s, you should have a clinical breast exam (CBE) by a health care provider every 1-3 years as part of a regular health  exam.  If you are 40 or older, have a CBE every year. Also consider having a breast X-ray (mammogram) every year.  If you have a family history of breast cancer, talk to your health care provider about genetic screening.  If you are at high risk for breast cancer, talk to your health care provider about having an MRI and a mammogram every year.  Breast cancer gene (BRCA) assessment is recommended for women who have family members with BRCA-related cancers. BRCA-related cancers include:  Breast.  Ovarian.  Tubal.  Peritoneal cancers.  Results of the assessment will determine the  need for genetic counseling and BRCA1 and BRCA2 testing. Cervical Cancer Routine pelvic examinations to screen for cervical cancer are no longer recommended for nonpregnant women who are considered low risk for cancer of the pelvic organs (ovaries, uterus, and vagina) and who do not have symptoms. A pelvic examination may be necessary if you have symptoms including those associated with pelvic infections. Ask your health care provider if a screening pelvic exam is right for you.   The Pap test is the screening test for cervical cancer for women who are considered at risk.  If you had a hysterectomy for a problem that was not cancer or a condition that could lead to cancer, then you no longer need Pap tests.  If you are older than 65 years, and you have had normal Pap tests for the past 10 years, you no longer need to have Pap tests.  If you have had past treatment for cervical cancer or a condition that could lead to cancer, you need Pap tests and screening for cancer for at least 20 years after your treatment.  If you no longer get a Pap test, assess your risk factors if they change (such as having a new sexual partner). This can affect whether you should start being screened again.  Some women have medical problems that increase their chance of getting cervical cancer. If this is the case for you, your health care provider may recommend more frequent screening and Pap tests.  The human papillomavirus (HPV) test is another test that may be used for cervical cancer screening. The HPV test looks for the virus that can cause cell changes in the cervix. The cells collected during the Pap test can be tested for HPV.  The HPV test can be used to screen women 32 years of age and older. Getting tested for HPV can extend the interval between normal Pap tests from three to five years.  An HPV test also should be used to screen women of any age who have unclear Pap test results.  After 54 years of age,  women should have HPV testing as often as Pap tests.  Colorectal Cancer  This type of cancer can be detected and often prevented.  Routine colorectal cancer screening usually begins at 54 years of age and continues through 54 years of age.  Your health care provider may recommend screening at an earlier age if you have risk factors for colon cancer.  Your health care provider may also recommend using home test kits to check for hidden blood in the stool.  A small camera at the end of a tube can be used to examine your colon directly (sigmoidoscopy or colonoscopy). This is done to check for the earliest forms of colorectal cancer.  Routine screening usually begins at age 74.  Direct examination of the colon should be repeated every 5-10 years through 54 years of  age. However, you may need to be screened more often if early forms of precancerous polyps or small growths are found. Skin Cancer  Check your skin from head to toe regularly.  Tell your health care provider about any new moles or changes in moles, especially if there is a change in a mole's shape or color.  Also tell your health care provider if you have a mole that is larger than the size of a pencil eraser.  Always use sunscreen. Apply sunscreen liberally and repeatedly throughout the day.  Protect yourself by wearing long sleeves, pants, a wide-brimmed hat, and sunglasses whenever you are outside. HEART DISEASE, DIABETES, AND HIGH BLOOD PRESSURE   Have your blood pressure checked at least every 1-2 years. High blood pressure causes heart disease and increases the risk of stroke.  If you are between 29 years and 62 years old, ask your health care provider if you should take aspirin to prevent strokes.  Have regular diabetes screenings. This involves taking a blood sample to check your fasting blood sugar level.  If you are at a normal weight and have a low risk for diabetes, have this test once every three years after 54  years of age.  If you are overweight and have a high risk for diabetes, consider being tested at a younger age or more often. PREVENTING INFECTION  Hepatitis B  If you have a higher risk for hepatitis B, you should be screened for this virus. You are considered at high risk for hepatitis B if:  You were born in a country where hepatitis B is common. Ask your health care provider which countries are considered high risk.  Your parents were born in a high-risk country, and you have not been immunized against hepatitis B (hepatitis B vaccine).  You have HIV or AIDS.  You use needles to inject street drugs.  You live with someone who has hepatitis B.  You have had sex with someone who has hepatitis B.  You get hemodialysis treatment.  You take certain medicines for conditions, including cancer, organ transplantation, and autoimmune conditions. Hepatitis C  Blood testing is recommended for:  Everyone born from [birth year] through 1965.  Anyone with known risk factors for hepatitis C. Sexually transmitted infections (STIs)  You should be screened for sexually transmitted infections (STIs) including gonorrhea and chlamydia if:  You are sexually active and are younger than 54 years of age.  You are older than 54 years of age and your health care provider tells you that you are at risk for this type of infection.  Your sexual activity has changed since you were last screened and you are at an increased risk for chlamydia or gonorrhea. Ask your health care provider if you are at risk.  If you do not have HIV, but are at risk, it may be recommended that you take a prescription medicine daily to prevent HIV infection. This is called pre-exposure prophylaxis (PrEP). You are considered at risk if:  You are sexually active and do not regularly use condoms or know the HIV status of your partner(s).  You take drugs by injection.  You are sexually active with a partner who has HIV. Talk with  your health care provider about whether you are at high risk of being infected with HIV. If you choose to begin PrEP, you should first be tested for HIV. You should then be tested every 3 months for as long as you are taking PrEP.  PREGNANCY  If you are premenopausal and you may become pregnant, ask your health care provider about preconception counseling.  If you may become pregnant, take 400 to 800 micrograms (mcg) of folic acid every day.  If you want to prevent pregnancy, talk to your health care provider about birth control (contraception). OSTEOPOROSIS AND MENOPAUSE   Osteoporosis is a disease in which the bones lose minerals and strength with aging. This can result in serious bone fractures. Your risk for osteoporosis can be identified using a bone density scan.  If you are 51 years of age or older, or if you are at risk for osteoporosis and fractures, ask your health care provider if you should be screened.  Ask your health care provider whether you should take a calcium or vitamin D supplement to lower your risk for osteoporosis.  Menopause may have certain physical symptoms and risks.  Hormone replacement therapy may reduce some of these symptoms and risks. Talk to your health care provider about whether hormone replacement therapy is right for you.  HOME CARE INSTRUCTIONS   Schedule regular health, dental, and eye exams.  Stay current with your immunizations.   Do not use any tobacco products including cigarettes, chewing tobacco, or electronic cigarettes.  If you are pregnant, do not drink alcohol.  If you are breastfeeding, limit how much and how often you drink alcohol.  Limit alcohol intake to no more than 1 drink per day for nonpregnant women. One drink equals 12 ounces of beer, 5 ounces of wine, or 1 ounces of hard liquor.  Do not use street drugs.  Do not share needles.  Ask your health care provider for help if you need support or information about quitting  drugs.  Tell your health care provider if you often feel depressed.  Tell your health care provider if you have ever been abused or do not feel safe at home. Document Released: 02/14/2011 Document Revised: 12/16/2013 Document Reviewed: 07/03/2013 [facility] Patient Information 2015 [place], [place]. This information is not intended to replace advice given to you by your health care provider. Make sure you discuss any questions you have with your health care provider.

## 2015-01-27 NOTE — Progress Notes (Signed)
Pre visit review using our clinic review tool, if applicable. No additional management support is needed unless otherwise documented below in the visit note. 

## 2015-01-27 NOTE — Assessment & Plan Note (Signed)
Checking routine labs, exercises and has lost about 5 pounds. Negative depression screen but going through increased stress with her son (overusing alcohol). Checking for hepatitis C today. Non-smoker. Up to date on colonoscopy and immunizations.

## 2015-01-27 NOTE — Progress Notes (Signed)
   Subjective:    Patient ID: Grace Lyons, female    DOB: Mar 10, 1961, 54 y.o.   MRN: [id number]  HPI The patient is a 54 YO female who comes in for wellness. No new complaints.   PMH, [facility], social history reviewed and updated today.   Review of Systems  Constitutional: Negative for fever, activity change, appetite change and unexpected weight change.  HENT: Negative.   Eyes: Negative.   Respiratory: Negative for cough, chest tightness, shortness of breath and wheezing.   Cardiovascular: Negative for chest pain, palpitations and leg swelling.  Gastrointestinal: Negative for nausea, abdominal pain, diarrhea, constipation and abdominal distention.  Musculoskeletal: Negative.   Skin: Negative.   Neurological: Negative.   Psychiatric/Behavioral: Negative.       Objective:   Physical Exam  Constitutional: She is oriented to person, place, and time. She appears well-developed and well-nourished.  HENT:  Head: Normocephalic and atraumatic.  Eyes: EOM are normal.  Neck: Normal range of motion.  Cardiovascular: Normal rate and regular rhythm.   Pulmonary/Chest: Effort normal. No respiratory distress. She has no wheezes. She has no rales.  Abdominal: Soft. Bowel sounds are normal. She exhibits no distension. There is no tenderness. There is no rebound.  Musculoskeletal: She exhibits no edema.  Neurological: She is alert and oriented to person, place, and time. Coordination normal.  Skin: Skin is warm and dry.  Psychiatric: She has a normal mood and affect.   Filed Vitals:   01/27/15 0823  BP: 122/82  Pulse: 70  Temp: 98.4 F (36.9 C)  TempSrc: Oral  Resp: 16  Height: 5\' 2"  (1.575 m)  Weight: 187 lb (84.823 kg)  SpO2: 90%      Assessment & Plan:

## 2015-01-28 LAB — HEPATITIS C ANTIBODY: HCV Ab: NEGATIVE

## 2015-02-02 ENCOUNTER — Telehealth: Payer: Self-pay | Admitting: Internal Medicine

## 2015-02-02 NOTE — Telephone Encounter (Signed)
Pt called in and would like a call back to go over her lab results  Best number is her cell number

## 2015-02-02 NOTE — Telephone Encounter (Signed)
Error

## 2015-02-03 NOTE — Telephone Encounter (Signed)
Pt informed of results.

## 2015-02-03 NOTE — Telephone Encounter (Signed)
Patient is calling back. Please call her back about her lab results.

## 2015-03-10 ENCOUNTER — Telehealth: Payer: Self-pay | Admitting: Internal Medicine

## 2015-03-10 NOTE — Telephone Encounter (Signed)
Received records from [place] forwarded 5 pages to Dr. Vertell Novak 03/10/15 fbg.

## 2015-04-15 ENCOUNTER — Ambulatory Visit (INDEPENDENT_AMBULATORY_CARE_PROVIDER_SITE_OTHER): Payer: BLUE CROSS/BLUE SHIELD | Admitting: Family Medicine

## 2015-04-15 ENCOUNTER — Other Ambulatory Visit (INDEPENDENT_AMBULATORY_CARE_PROVIDER_SITE_OTHER): Payer: BLUE CROSS/BLUE SHIELD

## 2015-04-15 ENCOUNTER — Encounter: Payer: Self-pay | Admitting: Family Medicine

## 2015-04-15 VITALS — BP 132/82 | HR 82 | Wt 188.0 lb

## 2015-04-15 DIAGNOSIS — M25562 Pain in left knee: Secondary | ICD-10-CM | POA: Diagnosis not present

## 2015-04-15 LAB — C-REACTIVE PROTEIN: CRP: 0.1 mg/dL — ABNORMAL LOW (ref 0.5–20.0)

## 2015-04-15 LAB — SEDIMENTATION RATE: Sed Rate: 9 mm/hr (ref 0–22)

## 2015-04-15 LAB — ANGIOTENSIN CONVERTING ENZYME: Angiotensin-Converting Enzyme: 26 U/L (ref 8–52)

## 2015-04-15 MED ORDER — MELOXICAM 15 MG PO TABS: 15.0000 mg | ORAL_TABLET | Freq: Every day | ORAL | Status: DC

## 2015-04-15 NOTE — Patient Instructions (Signed)
Good to see you Grace Lyons is your friend We made changes to the orthotics xrays and labs downstairs today If normal great but if not perfect in a week send me email and I will get MRi.  Meloxicam daily for 10 days then as needed

## 2015-04-15 NOTE — Progress Notes (Signed)
  [facility] [address] [place], [place] [address] Phone: [phone] Subjective:     CC: Right knee pain and swelling follow-upnew problem bilateral for pain  [id number] [name] is a 54 y.o. female coming in with complaint of right knee pain and swelling. Patient was seen by another orthopedic physician previously. Patient actually had left knee pain and was diagnosed with moderate osteoarthritic changes on MRI. I do not have this imaging. We also did not have x-rays. Patient was recently given viscous supplementation by an outside provider and states that this did not make any significant improvement. States that the inflammation continues to give her trouble. Patient has been seen by me and has had more of a synovitis. Not a true effusion noted. Patient states that the left leg seems to be unstable and is locking on her from time to time.   Patient is having is having bilateral foot pain. Patient was placed in custom orthotics and states some of the pain is better. States that it seems to be somewhat uncomfortable with certain range of motion. Denies any numbness or tingling. States that she can walk longer distances with less pain.  Past medical history, social, surgical and family history all reviewed in electronic medical record.   Review of Systems: No headache, visual changes, nausea, vomiting, diarrhea, constipation, dizziness, abdominal pain, skin rash, fevers, chills, night sweats, weight loss, swollen lymph nodes, body aches, joint swelling, muscle aches, chest pain, shortness of breath, mood changes.   Objective Blood pressure 132/82, pulse 82, weight 188 lb (85.276 kg).  General: No apparent distress alert and oriented x3 mood and affect normal, dressed appropriately. obese HEENT: Pupils equal, extraocular movements intact  Respiratory: Patient's speak in full sentences and does not appear short of breath  Cardiovascular: No lower  extremity edema, non tender, no erythema  Skin: Warm dry intact with no signs of infection or rash on extremities or on axial skeleton.  Abdomen: Soft nontender  Neuro: Cranial nerves II through XII are intact, neurovascularly intact in all extremities with 2+ DTRs and 2+ pulses.  Lymph: No lymphadenopathy of posterior or anterior cervical chain or axillae bilaterally.  Gait antalgic gait still present MSK:  Non tender with full range of motion and good stability and symmetric strength and tone of shoulders, elbows, wrist, hip, and ankles bilaterally.  Knee: bilateral Trace effusion bilaterally right Patient has limitation lacking the last 10 of extension. In last 5 of flexion on left side Ligaments with solid consistent endpoints including ACL, PCL, LCL, MCL. Negative Mcmurray's, Apley's, and Thessalonian tests. painful patellar compression. Patellar glide with minimal crepitus. Patellar and quadriceps tendons unremarkable. Hamstring and quadriceps strength is normal.    Foot exam shows the patient does have severe pes planus bilaterally with mild over pronation of the hindfoot as well as a wide midfoot. Breakdown of the transverse and longitudinal arch noted bilaterally    After informed written and verbal consent, patient was seated on exam table. Left knee was prepped with alcohol swab and utilizing anterolateral approach, patient's left knee space was injected with 4:1  marcaine 0.5%: Kenalog 40mg /dL. Patient tolerated the procedure well without immediate complications.    Impression and Recommendations:     This case required medical decision making of moderate complexity.

## 2015-04-15 NOTE — Assessment & Plan Note (Signed)
I do believe the patient does have some underlying arthritis. X-rays ordered today to further evaluate. Patient though has had synovitis previously and I think she continues to have some. I do think that autoimmune workup is necessary at this time and labs will be drawn. We discussed icing regimen. We discussed home exercises. We discussed which activities to avoid. She continues to have difficulty we may need to consider repeat MRI for further evaluation. Patient states another provider told her that knee replacement is not a possibility but they don't see anything specific that could help with the pain. Patient will continue with over-the-counter natural supplementations as well.

## 2015-04-16 LAB — ANA: Anti Nuclear Antibody(ANA): NEGATIVE

## 2015-04-16 LAB — CYCLIC CITRUL PEPTIDE ANTIBODY, IGG: Cyclic Citrullin Peptide Ab: <2 U/mL (ref 0.0–5.0)

## 2015-04-16 LAB — ANTI-DNA ANTIBODY, DOUBLE-STRANDED: ds DNA Ab: <1 IU/mL

## 2015-05-07 ENCOUNTER — Telehealth: Payer: Self-pay | Admitting: Internal Medicine

## 2015-05-07 NOTE — Telephone Encounter (Signed)
Patient has a yeast infection and OTC meds are not working.  Can you call her in something. Pharmacy is [facility] on [address].

## 2015-05-08 MED ORDER — FLUCONAZOLE 150 MG PO TABS: 150.0000 mg | ORAL_TABLET | Freq: Once | ORAL | Status: DC

## 2015-05-08 NOTE — Telephone Encounter (Signed)
Called pt no answer LMOM with md response.../lmb 

## 2015-05-08 NOTE — Telephone Encounter (Signed)
Sent in fluconazole, take 1 pill now. If no improvement call back or call your gynecologist for a visit.

## 2015-08-13 ENCOUNTER — Ambulatory Visit (INDEPENDENT_AMBULATORY_CARE_PROVIDER_SITE_OTHER)
Admission: RE | Admit: 2015-08-13 | Discharge: 2015-08-13 | Disposition: A | Discharge status: Home / Self Care | Payer: BLUE CROSS/BLUE SHIELD | Source: Ambulatory Visit | Attending: Family Medicine | Admitting: Family Medicine

## 2015-08-13 ENCOUNTER — Encounter: Payer: Self-pay | Admitting: Family Medicine

## 2015-08-13 ENCOUNTER — Ambulatory Visit (INDEPENDENT_AMBULATORY_CARE_PROVIDER_SITE_OTHER): Payer: BLUE CROSS/BLUE SHIELD | Admitting: Family Medicine

## 2015-08-13 VITALS — BP 112/80 | HR 74 | Ht 62.0 in | Wt 183.0 lb

## 2015-08-13 DIAGNOSIS — M216X9 Other acquired deformities of unspecified foot: Secondary | ICD-10-CM

## 2015-08-13 DIAGNOSIS — M25562 Pain in left knee: Secondary | ICD-10-CM | POA: Diagnosis not present

## 2015-08-13 DIAGNOSIS — E669 Obesity, unspecified: Secondary | ICD-10-CM | POA: Diagnosis not present

## 2015-08-13 DIAGNOSIS — M174 Other bilateral secondary osteoarthritis of knee: Secondary | ICD-10-CM | POA: Diagnosis not present

## 2015-08-13 NOTE — Progress Notes (Signed)
Pre visit review using our clinic review tool, if applicable. No additional management support is needed unless otherwise documented below in the visit note. 

## 2015-08-13 NOTE — Progress Notes (Signed)
[facility] [address] [place], [place] [address] Phone: [phone] Subjective:     CC: bilateral knee pain  [id number] Grace Lyons is a 54 y.o. female coming in with complaint of right knee pain and swelling. Patient was seen by another orthopedic physician previously. Patient actually had left knee pain and was diagnosed with moderate osteoarthritic changes on MRI, And patient did not have x-rays were ordered last time. Patient though did respond very well to an injection 4 months ago in the left knee. Patient was to do conservative therapy. This included home exercises, icing, and patient statesboth knees are giving her trouble. Discuss it as a dull, throbbing aching sensation. 4 months ago patient was seen and did have an injection in her left knee. Denies any giving out on her. States though that it is starting wake her up at night. Starting at the daily activities.  Past Medical History  Diagnosis Date  . VITAMIN D DEFICIENCY   . OBESITY   . Fibroid   . Cervical polyp   . Menometrorrhagia    Past Surgical History  Procedure Laterality Date  . Tubal ligation    . Combined hysteroscopy diagnostic / d&c    . Endometrial biopsy  12/17/2008  . Hysteroscopy w/d&c  08/31/2012    Procedure: DILATATION AND CURETTAGE /HYSTEROSCOPY;  Surgeon: Delice Lesch, MD;  Location: [facility];  Service: Gynecology;  Laterality: N/A;  with resectoscope   Social History  Substance Use Topics  . Smoking status: Former Smoker    Quit date: 01/12/1996  . Smokeless tobacco: None     Comment: Single, lives alone. Works as Licensed conveyancer at [employer]. Has 23yo son at [place]  . Alcohol Use: No   No Known Allergies Family History  Problem Relation Age of Onset  . Diabetes Mother   . Hypertension Mother   . Diabetes Father   . Hypertension Father       Past medical history, social, surgical and family history all reviewed in electronic  medical record.   Review of Systems: No headache, visual changes, nausea, vomiting, diarrhea, constipation, dizziness, abdominal pain, skin rash, fevers, chills, night sweats, weight loss, swollen lymph nodes, body aches, joint swelling, muscle aches, chest pain, shortness of breath, mood changes.   Objective Blood pressure 112/80, pulse 74, height 5\' 2"  (1.575 m), weight 183 lb (83.008 kg), SpO2 96 %.  General: No apparent distress alert and oriented x3 mood and affect normal, dressed appropriately. obese HEENT: Pupils equal, extraocular movements intact  Respiratory: Patient's speak in full sentences and does not appear short of breath  Cardiovascular: No lower extremity edema, non tender, no erythema  Skin: Warm dry intact with no signs of infection or rash on extremities or on axial skeleton.  Abdomen: Soft nontender  Neuro: Cranial nerves II through XII are intact, neurovascularly intact in all extremities with 2+ DTRs and 2+ pulses.  Lymph: No lymphadenopathy of posterior or anterior cervical chain or axillae bilaterally.  Gait antalgic gait still present MSK:  Non tender with full range of motion and good stability and symmetric strength and tone of shoulders, elbows, wrist, hip, and ankles bilaterally.  Knee: bilateral No effusion noted. Mild valgus deformity bilaterally. Near full range of motion bilaterally Severe tenderness to palpation over the superior lateral patella joint as well as the medial joint line bilaterally Ligaments with solid consistent endpoints including ACL, PCL, LCL, MCL. Negative Mcmurray's, Apley's, and Thessalonian  tests. painful patellar compression. Patellar glide with minimal crepitus. Patellar and quadriceps tendons unremarkable. Hamstring and quadriceps strength is normal.    After informed written and verbal consent, patient was seated on exam table. Right knee was prepped with alcohol swab and utilizing anterolateral approach, patient's right knee  space was injected with 4:1  marcaine 0.5%: Kenalog 40mg /dL. Patient tolerated the procedure well without immediate complications.  After informed written and verbal consent, patient was seated on exam table. Left knee was prepped with alcohol swab and utilizing anterolateral approach, patient's left knee space was injected with 4:1  marcaine 0.5%: Kenalog 40mg /dL. Patient tolerated the procedure well without immediate complications.    Impression and Recommendations:     This case required medical decision making of moderate complexity.

## 2015-08-13 NOTE — Patient Instructions (Addendum)
Good to see you.  Ice 20 minutes 2 times daily. Usually after activity and before bed. Lets get the xray downstairs today.  Tylenol 650 mg 3 times daily Continue the vitamins Continue the brace if it helps See me again in 4 weeks and can do Orthovisc if needed.

## 2015-08-13 NOTE — Assessment & Plan Note (Signed)
Encourage weight loss. 

## 2015-08-13 NOTE — Assessment & Plan Note (Signed)
Improved with custom orthotics.

## 2015-08-13 NOTE — Assessment & Plan Note (Signed)
Worsening.Patient given bilateral injections today. We discussed icing regimen and home exercises. We discussed which activities to do an which was to potentially avoid. Patient will continue to stay active. We discussed icing regimen. Discussed bracing which patient would like to continue with over-the-counter. Decline formal physical therapy.patient to be a candidate for viscous supple mentation and follow-up in 4 weeks.

## 2015-12-03 ENCOUNTER — Telehealth: Payer: Self-pay | Admitting: Internal Medicine

## 2015-12-03 NOTE — Telephone Encounter (Signed)
Spoke to pt, advised her it's been too long since her last steroid injection that we will have to have her come back in & repeat the steroid injection for her insurance to cover the synvisc series injections. Scheduled pt 4.26.17 @ 830am.

## 2015-12-03 NOTE — Telephone Encounter (Signed)
Left message for patient to call me back. 

## 2015-12-03 NOTE — Telephone Encounter (Signed)
Pt called stating she needs to get knee injections and it needs to be approved first. Injection is called Orthodisc for both knees. Call pt @ [phone]. Thank you!

## 2015-12-03 NOTE — Telephone Encounter (Signed)
Dr. Tamala Julian informed patient that she cannot get anymore Cortizone shots. She was advised to call back if the knee pain came back to call him back. He said that she will need another type of injection. She cannot remember the name. Its 3 to 4 injections in each knee once a week and she would like to get both knees done at the same time. The patient says she is having bilateral knee pain and Dr. Tamala Julian will know what she is talking about. She was told to call ahead because these injections will need prior approval. Please call patient, thanks.

## 2015-12-09 ENCOUNTER — Ambulatory Visit (INDEPENDENT_AMBULATORY_CARE_PROVIDER_SITE_OTHER): Payer: BLUE CROSS/BLUE SHIELD | Admitting: Family Medicine

## 2015-12-09 ENCOUNTER — Encounter: Payer: Self-pay | Admitting: Family Medicine

## 2015-12-09 VITALS — BP 122/80 | HR 76 | Wt 181.0 lb

## 2015-12-09 DIAGNOSIS — M170 Bilateral primary osteoarthritis of knee: Secondary | ICD-10-CM | POA: Diagnosis not present

## 2015-12-09 HISTORY — DX: Bilateral primary osteoarthritis of knee: M17.0

## 2015-12-09 NOTE — Patient Instructions (Addendum)
Great to see you  Grace Lyons is your friend Consider the brace and call if you want COntinue the vitamins See me again in 4-6 weeks and if not great then would consider the synvisc.

## 2015-12-09 NOTE — Progress Notes (Signed)
[name] [address] [place], [place] [address] Phone: [phone] Subjective:     CC: bilateral knee pain follow [id number] Grace Lyons is a 55 y.o. female coming in with complaint of right knee pain and swelling. Patient was seen by another orthopedic physician previously.patient does have moderate osteophytic changes of the knees bilaterally. Patient was last seen greater than 4 months ago and did have injections. Was doing very well until the last 2 weeks. Started with the right knee and now both knees. States that she is having some mild instability. Continues to be fairly active. Having difficulty with such things as stairs. Continues to have chronic ankle pain as well.  Past Medical History  Diagnosis Date  . VITAMIN D DEFICIENCY   . OBESITY   . Fibroid   . Cervical polyp   . Menometrorrhagia    Past Surgical History  Procedure Laterality Date  . Tubal ligation    . Combined hysteroscopy diagnostic / d&c    . Endometrial biopsy  12/17/2008  . Hysteroscopy w/d&c  08/31/2012    Procedure: DILATATION AND CURETTAGE /HYSTEROSCOPY;  Surgeon: Delice Lesch, MD;  Location: [facility];  Service: Gynecology;  Laterality: N/A;  with resectoscope   Social History  Substance Use Topics  . Smoking status: Former Smoker    Quit date: 01/12/1996  . Smokeless tobacco: None     Comment: Single, lives alone. 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 Alcohol Use: No   No Known Allergies Family History  Problem Relation Age of Onset  . Diabetes Mother   . Hypertension Mother   . Diabetes Father   . Hypertension Father       Past medical history, social, surgical and family history all reviewed in electronic medical record.   Review of Systems: No headache, visual changes, nausea, vomiting, diarrhea, constipation, dizziness, abdominal pain, skin rash, fevers, chills, night sweats, weight loss, swollen  lymph nodes, body aches, joint swelling, muscle aches, chest pain, shortness of breath, mood changes.   Objective Blood pressure 122/80, pulse 76, weight 181 lb (82.101 kg).  General: No apparent distress alert and oriented x3 mood and affect normal, dressed appropriately. obese HEENT: Pupils equal, extraocular movements intact  Respiratory: Patient's speak in full sentences and does not appear short of breath  Cardiovascular: No lower extremity edema, non tender, no erythema  Skin: Warm dry intact with no signs of infection or rash on extremities or on axial skeleton.  Abdomen: Soft nontender  Neuro: Cranial nerves II through XII are intact, neurovascularly intact in all extremities with 2+ DTRs and 2+ pulses.  Lymph: No lymphadenopathy of posterior or anterior cervical chain or axillae bilaterally.  Gait antalgic gait still present MSK:  Non tender with full range of motion and good stability and symmetric strength and tone of shoulders, elbows, wrist, hip, and ankles bilaterally.  Knee: bilateral No effusion noted. Mild valgus deformity bilaterally. Near full range of motion bilaterally Severe tenderness to palpation over the superior lateral patella joint as well as the medial joint line bilaterally Mild instability noted of the right knee with valgus force. Negative Mcmurray's, Apley's, and Thessalonian tests. painful patellar compression. Patellar glide with minimal crepitus. Patellar and quadriceps tendons unremarkable. Hamstring and quadriceps strength is normal.    After informed written and verbal consent, patient was seated on exam table. Right knee was prepped with alcohol swab and utilizing anterolateral  approach, patient's right knee space was injected with 4:1  marcaine 0.5%: Kenalog 40mg /dL. Patient tolerated the procedure well without immediate complications.  After informed written and verbal consent, patient was seated on exam table. Left knee was prepped with alcohol  swab and utilizing anterolateral approach, patient's left knee space was injected with 4:1  marcaine 0.5%: Kenalog 40mg /dL. Patient tolerated the procedure well without immediate complications.    Impression and Recommendations:     This case required medical decision making of moderate complexity.

## 2015-12-09 NOTE — Assessment & Plan Note (Signed)
Worsening symptoms.Patient given injection today. Tolerated the procedure well in both knees. We discussed possible custom bracing which patient declined at the moment but if she changes her mind she will call. We discussed icing regimen. Patient would be a candidate for viscous supplementation if necessary. Patient will continue with the conservative therapy and we will see patient back again in 4-6 weeks.

## 2015-12-22 ENCOUNTER — Other Ambulatory Visit: Payer: Self-pay

## 2015-12-22 DIAGNOSIS — Z1231 Encounter for screening mammogram for malignant neoplasm of breast: Secondary | ICD-10-CM

## 2016-01-26 ENCOUNTER — Telehealth: Payer: Self-pay | Admitting: Family Medicine

## 2016-01-26 ENCOUNTER — Ambulatory Visit
Admission: RE | Admit: 2016-01-26 | Discharge: 2016-01-26 | Disposition: A | Discharge status: Home / Self Care | Payer: BLUE CROSS/BLUE SHIELD | Source: Ambulatory Visit

## 2016-01-26 DIAGNOSIS — Z1231 Encounter for screening mammogram for malignant neoplasm of breast: Secondary | ICD-10-CM

## 2016-01-26 NOTE — Telephone Encounter (Signed)
Discussed with pt. Scheduled appt for 6.21.17.

## 2016-01-26 NOTE — Telephone Encounter (Signed)
lmovm for pt to return call.  

## 2016-01-26 NOTE — Telephone Encounter (Signed)
Patient would like a call back on ordering an injection and making a follow up.

## 2016-02-03 ENCOUNTER — Encounter: Payer: Self-pay | Admitting: Family Medicine

## 2016-02-03 ENCOUNTER — Ambulatory Visit (INDEPENDENT_AMBULATORY_CARE_PROVIDER_SITE_OTHER): Payer: BLUE CROSS/BLUE SHIELD | Admitting: Family Medicine

## 2016-02-03 VITALS — BP 124/84 | HR 81 | Ht 62.0 in | Wt 181.0 lb

## 2016-02-03 DIAGNOSIS — M170 Bilateral primary osteoarthritis of knee: Secondary | ICD-10-CM

## 2016-02-03 NOTE — Assessment & Plan Note (Signed)
Patient was started on Orthovisc today. Tolerated the procedure well. We discussed icing regimen and home exercises. Discussed which activities to do. Encourage weight loss. Patient will continue to wear orthotics with her flatfeet. We will see patient back again in 1 week for second in a series of 4 injections.

## 2016-02-03 NOTE — Progress Notes (Signed)
[name] [address] [place], [place] [address] Phone: [phone] Subjective:     CC: bilateral knee pain follow up [id number] Grace Lyons is a 55 y.o. female coming in with complaint of right knee pain and swelling. Patient was seen by another orthopedic physician previously.patient does have moderate osteophytic changes of the knees bilaterally. We saw her 4 weeks ago and was given bilateral steroid injections in her knees. Encourage patient to continue with conservative therapy. Has failed all other conservative therapy including formal physical therapy. Workup for autoimmune labs negative as well. Patient states right knee is better but left knee still hurts. Still instability  Has failed synvisc from outside facility   Past Medical History  Diagnosis Date  . VITAMIN D DEFICIENCY   . OBESITY   . Fibroid   . Cervical polyp   . Menometrorrhagia    Past Surgical History  Procedure Laterality Date  . Tubal ligation    . Combined hysteroscopy diagnostic / d&c    . Endometrial biopsy  12/17/2008  . Hysteroscopy w/d&c  08/31/2012    Procedure: DILATATION AND CURETTAGE /HYSTEROSCOPY;  Surgeon: Delice Lesch, MD;  Location: [facility];  Service: Gynecology;  Laterality: N/A;  with resectoscope   Social History  Substance Use Topics  . Smoking status: Former Smoker    Quit date: 01/12/1996  . Smokeless tobacco: None     Comment: Single, lives alone. 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 Alcohol Use: No   No Known Allergies Family History  Problem Relation Age of Onset  . Diabetes Mother   . Hypertension Mother   . Diabetes Father   . Hypertension Father       Past medical history, social, surgical and family history all reviewed in electronic medical record.   Review of Systems: No headache, visual changes, nausea, vomiting, diarrhea, constipation, dizziness, abdominal pain, skin rash,  fevers, chills, night sweats, weight loss, swollen lymph nodes, body aches, joint swelling, muscle aches, chest pain, shortness of breath, mood changes.   Objective Blood pressure 124/84, pulse 81, height 5\' 2"  (1.575 m), weight 181 lb (82.101 kg), SpO2 96 %.  General: No apparent distress alert and oriented x3 mood and affect normal, dressed appropriately. obese HEENT: Pupils equal, extraocular movements intact  Respiratory: Patient's speak in full sentences and does not appear short of breath  Cardiovascular: No lower extremity edema, non tender, no erythema  Skin: Warm dry intact with no signs of infection or rash on extremities or on axial skeleton.  Abdomen: Soft nontender  Neuro: Cranial nerves II through XII are intact, neurovascularly intact in all extremities with 2+ DTRs and 2+ pulses.  Lymph: No lymphadenopathy of posterior or anterior cervical chain or axillae bilaterally.  Gait antalgic gait still present MSK:  Non tender with full range of motion and good stability and symmetric strength and tone of shoulders, elbows, wrist, hip, and ankles bilaterally.  Knee: bilateral No effusion noted. Mild valgus deformity bilaterally. Full range of motion. Severe tenderness to palpation over the superior lateral patella joint as well as the medial joint line bilaterally Mild instability of the knees with valgus force bilaterally Negative Mcmurray's, Apley's, and Thessalonian tests. painful patellar compression. Patellar glide with mild to moderate with worsening on the left side for crepitus. Patellar and quadriceps tendons unremarkable. Hamstring and quadriceps strength is normal.   .  After informed written and verbal  consent, patient was seated on exam table. Left knee was prepped with alcohol swab and utilizing anterolateral approach, patient's left knee space was injected with 15 mg/2.5 mL of Orthovisc(sodium hyaluronate) in a prefilled syringe was injected easily into the knee  through a 22-gauge needle... Patient tolerated the procedure well without immediate complications.    Impression and Recommendations:     This case required medical decision making of moderate complexity.

## 2016-02-03 NOTE — Progress Notes (Signed)
Pre visit review using our clinic review tool, if applicable. No additional management support is needed unless otherwise documented below in the visit note. 

## 2016-02-03 NOTE — Patient Instructions (Addendum)
Good to see you  We are trying the orthovisc and I have my fingers crossed.  Ice is your friend Keep trucking along  Consider the custom brace See you next week for 2nd in a series of 4 injections.

## 2016-02-08 ENCOUNTER — Telehealth: Payer: Self-pay | Admitting: Internal Medicine

## 2016-02-08 NOTE — Telephone Encounter (Signed)
Pt would like to be worked in this week for her appt she had to cancel.  Can you call her when you get a chance

## 2016-02-08 NOTE — Telephone Encounter (Signed)
Pt scheduled 6.28.17.

## 2016-02-10 ENCOUNTER — Encounter: Payer: Self-pay | Admitting: Family Medicine

## 2016-02-10 ENCOUNTER — Ambulatory Visit: Payer: BLUE CROSS/BLUE SHIELD | Admitting: Family Medicine

## 2016-02-10 ENCOUNTER — Ambulatory Visit (INDEPENDENT_AMBULATORY_CARE_PROVIDER_SITE_OTHER): Payer: BLUE CROSS/BLUE SHIELD | Admitting: Family Medicine

## 2016-02-10 VITALS — BP 134/82 | HR 84 | Wt 182.0 lb

## 2016-02-10 DIAGNOSIS — M170 Bilateral primary osteoarthritis of knee: Secondary | ICD-10-CM | POA: Diagnosis not present

## 2016-02-10 NOTE — Assessment & Plan Note (Signed)
Second in a series of 4 injections on the left knee. We discussed we will continue to monitor. Patient will come back again in 1 week for third in a series of 4 injections.

## 2016-02-10 NOTE — Patient Instructions (Signed)
Good to see you  2nd injection today  Ice is your friend Stay active See me again in 1 week for 3rd injection.

## 2016-02-10 NOTE — Progress Notes (Signed)
[name] [address] [place], [place] [address] Phone: [phone] Subjective:     CC: bilateral knee pain follow up [id number] Tacy R Malon is a 55 y.o. female coming in with complaint of right knee pain and swelling. Patient was seen by another orthopedic physician previously.patient does have moderate osteophytic changes of the knees bilaterally. We saw her 4 weeks ago and was given bilateral steroid injections in her knees. Encourage patient to continue with conservative therapy. Has failed all other conservative therapy including formal physical therapy. Workup for autoimmune labs negative as well. Patient states right knee is better but left knee still hurts. Still instability  Has failed synvisc from outside facility  Started orthovisc last week.  Here for 2nd injection. States no improvement with the first one.   Past Medical History  Diagnosis Date  . VITAMIN D DEFICIENCY   . OBESITY   . Fibroid   . Cervical polyp   . Menometrorrhagia    Past Surgical History  Procedure Laterality Date  . Tubal ligation    . Combined hysteroscopy diagnostic / d&c    . Endometrial biopsy  12/17/2008  . Hysteroscopy w/d&c  08/31/2012    Procedure: DILATATION AND CURETTAGE /HYSTEROSCOPY;  Surgeon: Delice Lesch, MD;  Location: [facility];  Service: Gynecology;  Laterality: N/A;  with resectoscope   Social History  Substance Use Topics  . Smoking status: Former Smoker    Quit date: 01/12/1996  . Smokeless tobacco: None     Comment: Single, lives alone. 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 Alcohol Use: No   No Known Allergies Family History  Problem Relation Age of Onset  . Diabetes Mother   . Hypertension Mother   . Diabetes Father   . Hypertension Father       Past medical history, social, surgical and family history all reviewed in electronic medical record.   Review of Systems: No headache,  visual changes, nausea, vomiting, diarrhea, constipation, dizziness, abdominal pain, skin rash, fevers, chills, night sweats, weight loss, swollen lymph nodes, body aches, joint swelling, muscle aches, chest pain, shortness of breath, mood changes.   Objective Blood pressure 134/82, pulse 84, weight 182 lb (82.555 kg).  General: No apparent distress alert and oriented x3 mood and affect normal, dressed appropriately. obese HEENT: Pupils equal, extraocular movements intact  Respiratory: Patient's speak in full sentences and does not appear short of breath  Cardiovascular: No lower extremity edema, non tender, no erythema  Skin: Warm dry intact with no signs of infection or rash on extremities or on axial skeleton.  Abdomen: Soft nontender  Neuro: Cranial nerves II through XII are intact, neurovascularly intact in all extremities with 2+ DTRs and 2+ pulses.  Lymph: No lymphadenopathy of posterior or anterior cervical chain or axillae bilaterally.  Gait antalgic gait still present MSK:  Non tender with full range of motion and good stability and symmetric strength and tone of shoulders, elbows, wrist, hip, and ankles bilaterally.  Knee: bilateral No effusion noted. Mild valgus deformity bilaterally. Full range of motion. Severe tenderness to palpation over the superior lateral patella joint as well as the medial joint line bilaterally Mild instability of the knees with valgus force bilaterally Negative Mcmurray's, Apley's, and Thessalonian tests. painful patellar compression. Patellar glide with mild to moderate with worsening on the left side for crepitus. Patellar and quadriceps tendons unremarkable. Hamstring and quadriceps strength is normal.  No change from previous exam Contralateral knee unremarkable.  After informed written and verbal consent, patient was seated on exam table. Left knee was prepped with alcohol swab and utilizing anterolateral approach, patient's left knee space was  injected with 15 mg/2.5 mL of Orthovisc(sodium hyaluronate) in a prefilled syringe was injected easily into the knee through a 22-gauge needle... Patient tolerated the procedure well without immediate complications.    Impression and Recommendations:     This case required medical decision making of moderate complexity.

## 2016-02-18 ENCOUNTER — Ambulatory Visit (INDEPENDENT_AMBULATORY_CARE_PROVIDER_SITE_OTHER): Payer: BLUE CROSS/BLUE SHIELD | Admitting: Family Medicine

## 2016-02-18 ENCOUNTER — Encounter: Payer: Self-pay | Admitting: Family Medicine

## 2016-02-18 DIAGNOSIS — M170 Bilateral primary osteoarthritis of knee: Secondary | ICD-10-CM

## 2016-02-18 NOTE — Assessment & Plan Note (Signed)
Doing better after the second injection and today was given the third injection of 4 injections series. Patient will continue the other conservative therapy. We discussed icing regimen and home exercises. Patient come back and see me again in 1 week for fourth and final injection.

## 2016-02-18 NOTE — Progress Notes (Signed)
[name] [address] [place], [place] [address] Phone: [phone] Subjective:     CC: bilateral knee pain follow up [id number] Grace Lyons is a 55 y.o. female coming in with complaint of right knee pain and swelling. Patient was seen by another orthopedic physician previously.patient does have moderate osteophytic changes of the knees bilaterally. We saw her 4 weeks ago and was given bilateral steroid injections in her knees. Encourage patient to continue with conservative therapy. Has failed all other conservative therapy including formal physical therapy. Workup for autoimmune labs negative as well. Patient states right knee is better but left knee still hurts. Still instability  Has failed synvisc from outside facility  Started orthovisc last week.  Here for  3rd injection  Patient states small improvement. Patient states daily activities has gotten better and she is sleeping through the night which is the first time in many months if not years.    Past Medical History  Diagnosis Date  . VITAMIN D DEFICIENCY   . OBESITY   . Fibroid   . Cervical polyp   . Menometrorrhagia    Past Surgical History  Procedure Laterality Date  . Tubal ligation    . Combined hysteroscopy diagnostic / d&c    . Endometrial biopsy  12/17/2008  . Hysteroscopy w/d&c  08/31/2012    Procedure: DILATATION AND CURETTAGE /HYSTEROSCOPY;  Surgeon: Delice Lesch, MD;  Location: [facility];  Service: Gynecology;  Laterality: N/A;  with resectoscope   Social History  Substance Use Topics  . Smoking status: Former Smoker    Quit date: 01/12/1996  . Smokeless tobacco: Not on file     Comment: Single, lives alone. 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 Alcohol Use: No   No Known Allergies Family History  Problem Relation Age of Onset  . Diabetes Mother   . Hypertension Mother   . Diabetes Father   . Hypertension Father        Past medical history, social, surgical and family history all reviewed in electronic medical record.   Review of Systems: No headache, visual changes, nausea, vomiting, diarrhea, constipation, dizziness, abdominal pain, skin rash, fevers, chills, night sweats, weight loss, swollen lymph nodes, body aches, joint swelling, muscle aches, chest pain, shortness of breath, mood changes.   Objective There were no vitals taken for this visit.  General: No apparent distress alert and oriented x3 mood and affect normal, dressed appropriately. obese HEENT: Pupils equal, extraocular movements intact  Respiratory: Patient's speak in full sentences and does not appear short of breath  Cardiovascular: No lower extremity edema, non tender, no erythema  Skin: Warm dry intact with no signs of infection or rash on extremities or on axial skeleton.  Abdomen: Soft nontender  Neuro: Cranial nerves II through XII are intact, neurovascularly intact in all extremities with 2+ DTRs and 2+ pulses.  Lymph: No lymphadenopathy of posterior or anterior cervical chain or axillae bilaterally.  Gait antalgic gait still present MSK:  Non tender with full range of motion and good stability and symmetric strength and tone of shoulders, elbows, wrist, hip, and ankles bilaterally.  Knee: bilateral No effusion noted. Mild valgus deformity bilaterally. Full range of motion. Severe tenderness to palpation over the superior lateral patella joint as well as the medial joint line bilaterally Mild instability of the knees with valgus force bilaterally Negative Mcmurray's, Apley's, and Thessalonian tests. painful patellar compression.  Patellar glide with mild to moderate with worsening on the left side for crepitus. Patellar and quadriceps tendons unremarkable. Hamstring and quadriceps strength is normal.  No change from previous exam Contralateral knee unremarkable.  After informed written and verbal consent, patient was  seated on exam table. Left knee was prepped with alcohol swab and utilizing anterolateral approach, patient's left knee space was injected with 15 mg/2.5 mL of Orthovisc(sodium hyaluronate) in a prefilled syringe was injected easily into the knee through a 22-gauge needle... Patient tolerated the procedure well without immediate complications.    Impression and Recommendations:     This case required medical decision making of moderate complexity.

## 2016-02-18 NOTE — Patient Instructions (Signed)
Great to see you  3 down to go and one more to go! Keep doing what you are doing.  It does appear you are walking a little better See you next week for 4th and final.

## 2016-02-25 ENCOUNTER — Encounter: Payer: Self-pay | Admitting: Family Medicine

## 2016-02-25 ENCOUNTER — Ambulatory Visit (INDEPENDENT_AMBULATORY_CARE_PROVIDER_SITE_OTHER): Payer: BLUE CROSS/BLUE SHIELD | Admitting: Family Medicine

## 2016-02-25 VITALS — BP 122/82 | HR 68 | Ht 62.0 in

## 2016-02-25 DIAGNOSIS — M170 Bilateral primary osteoarthritis of knee: Secondary | ICD-10-CM | POA: Diagnosis not present

## 2016-02-25 NOTE — Assessment & Plan Note (Signed)
Fourth and final injection given today. Patient tolerated the procedure well. We discussed icing regimen and home exercises. Discussed which activities to do in which ones to potentially avoid. Discussed with patient's and her to her antalgic gait possibly need to consider further workup of the pathology this is not continue to improve.

## 2016-02-25 NOTE — Progress Notes (Signed)
[facility] [address] [place], [place] [address] Phone: [phone] Subjective:     CC: bilateral knee pain follow up [id number] Grace Lyons is a 55 y.o. female coming in with complaint of right knee pain and swelling. Patient was seen by another orthopedic physician previously.patient does have moderate osteophytic changes of the knees bilaterally. Marland Kitchen Has failed all other conservative therapy including formal physical therapy. Workup for autoimmune labs negative as well. Patient states right knee is better but left knee still hurts. Still instability  Has failed synvisc from outside facility  Started orthovisc.  Here for  Fourth injection  Continued to have improvement. Doing much better overall. Still having more pain on the feet as well as somewhat on the lateral hip.    Past Medical History  Diagnosis Date  . VITAMIN D DEFICIENCY   . OBESITY   . Fibroid   . Cervical polyp   . Menometrorrhagia    Past Surgical History  Procedure Laterality Date  . Tubal ligation    . Combined hysteroscopy diagnostic / d&c    . Endometrial biopsy  12/17/2008  . Hysteroscopy w/d&c  08/31/2012    Procedure: DILATATION AND CURETTAGE /HYSTEROSCOPY;  Surgeon: Delice Lesch, MD;  Location: [facility];  Service: Gynecology;  Laterality: N/A;  with resectoscope   Social History  Substance Use Topics  . Smoking status: Former Smoker    Quit date: 01/12/1996  . Smokeless tobacco: None     Comment: Single, lives alone. Works as Licensed conveyancer at [employer]. Has 32yo son at [place]  . Alcohol Use: No   No Known Allergies Family History  Problem Relation Age of Onset  . Diabetes Mother   . Hypertension Mother   . Diabetes Father   . Hypertension Father       Past medical history, social, surgical and family history all reviewed in electronic medical record.   Review of Systems: No headache, visual changes, nausea, vomiting, diarrhea,  constipation, dizziness, abdominal pain, skin rash, fevers, chills, night sweats, weight loss, swollen lymph nodes, body aches, joint swelling, muscle aches, chest pain, shortness of breath, mood changes.   Objective Blood pressure 122/82, pulse 68, height 5\' 2"  (1.575 m), SpO2 97 %.  General: No apparent distress alert and oriented x3 mood and affect normal, dressed appropriately. obese HEENT: Pupils equal, extraocular movements intact  Respiratory: Patient's speak in full sentences and does not appear short of breath  Cardiovascular: No lower extremity edema, non tender, no erythema  Skin: Warm dry intact with no signs of infection or rash on extremities or on axial skeleton.  Abdomen: Soft nontender  Neuro: Cranial nerves II through XII are intact, neurovascularly intact in all extremities with 2+ DTRs and 2+ pulses.  Lymph: No lymphadenopathy of posterior or anterior cervical chain or axillae bilaterally.  Gait antalgic gait still present MSK:  Non tender with full range of motion and good stability and symmetric strength and tone of shoulders, elbows, wrist, hip, and ankles bilaterally.  Knee: bilateral No effusion noted. Mild valgus deformity bilaterally. Full range of motion. Severe tenderness to palpation over the superior lateral patella joint as well as the medial joint line bilaterally Mild instability of the knees with valgus force bilaterally Negative Mcmurray's, Apley's, and Thessalonian tests. painful patellar compression. Patellar glide with mild to moderate with worsening on the left side for crepitus. Patellar and quadriceps tendons unremarkable. Hamstring and quadriceps strength is normal.  No change from previous exam Contralateral knee unremarkable.  After informed written and verbal consent, patient was seated on exam table. Left knee was prepped with alcohol swab and utilizing anterolateral approach, patient's left knee space was injected with 15 mg/2.5 mL of  Orthovisc(sodium hyaluronate) in a prefilled syringe was injected easily into the knee through a 22-gauge needle... Patient tolerated the procedure well without immediate complications.    Impression and Recommendations:     This case required medical decision making of moderate complexity.

## 2016-02-25 NOTE — Patient Instructions (Addendum)
That is the last one! You get a break from me.  Ice when you need it Keep trucking along.  See me again in 4 weeks and if not perfect I would like to look at your back or hips.

## 2016-03-08 ENCOUNTER — Ambulatory Visit (INDEPENDENT_AMBULATORY_CARE_PROVIDER_SITE_OTHER): Payer: BLUE CROSS/BLUE SHIELD | Admitting: Internal Medicine

## 2016-03-08 ENCOUNTER — Other Ambulatory Visit (INDEPENDENT_AMBULATORY_CARE_PROVIDER_SITE_OTHER): Payer: BLUE CROSS/BLUE SHIELD

## 2016-03-08 ENCOUNTER — Encounter: Payer: Self-pay | Admitting: Internal Medicine

## 2016-03-08 VITALS — BP 142/90 | HR 76 | Temp 98.5°F | Resp 12 | Ht 62.0 in | Wt 185.0 lb

## 2016-03-08 DIAGNOSIS — Z0000 Encounter for general adult medical examination without abnormal findings: Secondary | ICD-10-CM

## 2016-03-08 DIAGNOSIS — E119 Type 2 diabetes mellitus without complications: Secondary | ICD-10-CM

## 2016-03-08 DIAGNOSIS — E669 Obesity, unspecified: Secondary | ICD-10-CM | POA: Diagnosis not present

## 2016-03-08 LAB — LIPID PANEL
Cholesterol: 151 mg/dL (ref 0–200)
HDL: 61.6 mg/dL (ref >39.00)
LDL Cholesterol: 79 mg/dL (ref 0–99)
NonHDL: 89.39
Total CHOL/HDL Ratio: 2
Triglycerides: 51 mg/dL (ref 0.0–149.0)
VLDL: 10.2 mg/dL (ref 0.0–40.0)

## 2016-03-08 LAB — CBC
HCT: 39.1 % (ref 36.0–46.0)
Hemoglobin: 12.9 g/dL (ref 12.0–15.0)
MCHC: 33.2 g/dL (ref 30.0–36.0)
MCV: 91.6 fl (ref 78.0–100.0)
Platelets: 261 10*3/uL (ref 150.0–400.0)
RBC: 4.26 Mil/uL (ref 3.87–5.11)
RDW: 14.8 % (ref 11.5–15.5)
WBC: 5.5 10*3/uL (ref 4.0–10.5)

## 2016-03-08 LAB — COMPREHENSIVE METABOLIC PANEL
ALT: 16 U/L (ref 0–35)
AST: 12 U/L (ref 0–37)
Albumin: 4.3 g/dL (ref 3.5–5.2)
Alkaline Phosphatase: 55 U/L (ref 39–117)
BUN: 10 mg/dL (ref 6–23)
CO2: 29 mEq/L (ref 19–32)
Calcium: 10.2 mg/dL (ref 8.4–10.5)
Chloride: 107 mEq/L (ref 96–112)
Creatinine, Ser: 0.66 mg/dL (ref 0.40–1.20)
GFR: 119.35 mL/min (ref >60.00)
Glucose, Bld: 89 mg/dL (ref 70–99)
Potassium: 4.3 mEq/L (ref 3.5–5.1)
Sodium: 140 mEq/L (ref 135–145)
Total Bilirubin: 0.7 mg/dL (ref 0.2–1.2)
Total Protein: 6.8 g/dL (ref 6.0–8.3)

## 2016-03-08 LAB — MICROALBUMIN / CREATININE URINE RATIO
Creatinine,U: 85 mg/dL
Microalb Creat Ratio: 0.8 mg/g (ref 0.0–30.0)
Microalb, Ur: <0.7 mg/dL (ref 0.0–1.9)

## 2016-03-08 LAB — HEMOGLOBIN A1C: Hgb A1c MFr Bld: 6.3 % (ref 4.6–6.5)

## 2016-03-08 NOTE — Assessment & Plan Note (Signed)
Talked to her about the need to work on weight loss for long term health of her knees and joints.

## 2016-03-08 NOTE — Assessment & Plan Note (Addendum)
Colonoscopy up to date, immunizations up to date except pneumonia that she declines today. Counseled on the need for exercise to help with weight loss. Counseled on the dangers of distracted driving. Checking labs. Given screening recommendations.

## 2016-03-08 NOTE — Patient Instructions (Signed)
We will check blood work and the urine today for the wellness exam.   Work on doing some exercise to help with maintaining your health.   Come back in about 1 year for physical.   Health Maintenance, Female Adopting a healthy lifestyle and getting preventive care can go a long way to promote health and wellness. Talk with your health care provider about what schedule of regular examinations is right for you. This is a good chance for you to check in with your provider about disease prevention and staying healthy. In between checkups, there are plenty of things you can do on your own. Experts have done a lot of research about which lifestyle changes and preventive measures are most likely to keep you healthy. Ask your health care provider for more information. WEIGHT AND DIET  Eat a healthy diet  Be sure to include plenty of vegetables, fruits, low-fat dairy products, and lean protein.  Do not eat a lot of foods high in solid fats, added sugars, or salt.  Get regular exercise. This is one of the most important things you can do for your health.  Most adults should exercise for at least 150 minutes each week. The exercise should increase your heart rate and make you sweat (moderate-intensity exercise).  Most adults should also do strengthening exercises at least twice a week. This is in addition to the moderate-intensity exercise.  Maintain a healthy weight  Body mass index (BMI) is a measurement that can be used to identify possible weight problems. It estimates body fat based on height and weight. Your health care provider can help determine your BMI and help you achieve or maintain a healthy weight.  For females 62 years of age and older:   A BMI below 18.5 is considered underweight.  A BMI of 18.5 to 24.9 is normal.  A BMI of 25 to 29.9 is considered overweight.  A BMI of 30 and above is considered obese.  Watch levels of cholesterol and blood lipids  You should start having  your blood tested for lipids and cholesterol at 56 years of age, then have this test every 5 years.  You may need to have your cholesterol levels checked more often if:  Your lipid or cholesterol levels are high.  You are older than 55 years of age.  You are at high risk for heart disease.  CANCER SCREENING   Lung Cancer  Lung cancer screening is recommended for adults 66-44 years old who are at high risk for lung cancer because of a history of smoking.  A yearly low-dose CT scan of the lungs is recommended for people who:  Currently smoke.  Have quit within the past 15 years.  Have at least a 30-pack-year history of smoking. A pack year is smoking an average of one pack of cigarettes a day for 1 year.  Yearly screening should continue until it has been 15 years since you quit.  Yearly screening should stop if you develop a health problem that would prevent you from having lung cancer treatment.  Breast Cancer  Practice breast self-awareness. This means understanding how your breasts normally appear and feel.  It also means doing regular breast self-exams. Let your health care provider know about any changes, no matter how small.  If you are in your 20s or 30s, you should have a clinical breast exam (CBE) by a health care provider every 1-3 years as part of a regular health exam.  If you are 40  or older, have a CBE every year. Also consider having a breast X-ray (mammogram) every year.  If you have a family history of breast cancer, talk to your health care provider about genetic screening.  If you are at high risk for breast cancer, talk to your health care provider about having an MRI and a mammogram every year.  Breast cancer gene (BRCA) assessment is recommended for women who have family members with BRCA-related cancers. BRCA-related cancers include:  Breast.  Ovarian.  Tubal.  Peritoneal cancers.  Results of the assessment will determine the need for genetic  counseling and BRCA1 and BRCA2 testing. Cervical Cancer Your health care provider may recommend that you be screened regularly for cancer of the pelvic organs (ovaries, uterus, and vagina). This screening involves a pelvic examination, including checking for microscopic changes to the surface of your cervix (Pap test). You may be encouraged to have this screening done every 3 years, beginning at age 12.  For women ages 84-65, health care providers may recommend pelvic exams and Pap testing every 3 years, or they may recommend the Pap and pelvic exam, combined with testing for human papilloma virus (HPV), every 5 years. Some types of HPV increase your risk of cervical cancer. Testing for HPV may also be done on women of any age with unclear Pap test results.  Other health care providers may not recommend any screening for nonpregnant women who are considered low risk for pelvic cancer and who do not have symptoms. Ask your health care provider if a screening pelvic exam is right for you.  If you have had past treatment for cervical cancer or a condition that could lead to cancer, you need Pap tests and screening for cancer for at least 20 years after your treatment. If Pap tests have been discontinued, your risk factors (such as having a new sexual partner) need to be reassessed to determine if screening should resume. Some women have medical problems that increase the chance of getting cervical cancer. In these cases, your health care provider may recommend more frequent screening and Pap tests. Colorectal Cancer  This type of cancer can be detected and often prevented.  Routine colorectal cancer screening usually begins at 55 years of age and continues through 55 years of age.  Your health care provider may recommend screening at an earlier age if you have risk factors for colon cancer.  Your health care provider may also recommend using home test kits to check for hidden blood in the stool.  A  small camera at the end of a tube can be used to examine your colon directly (sigmoidoscopy or colonoscopy). This is done to check for the earliest forms of colorectal cancer.  Routine screening usually begins at age 49.  Direct examination of the colon should be repeated every 5-10 years through 55 years of age. However, you may need to be screened more often if early forms of precancerous polyps or small growths are found. Skin Cancer  Check your skin from head to toe regularly.  Tell your health care provider about any new moles or changes in moles, especially if there is a change in a mole's shape or color.  Also tell your health care provider if you have a mole that is larger than the size of a pencil eraser.  Always use sunscreen. Apply sunscreen liberally and repeatedly throughout the day.  Protect yourself by wearing long sleeves, pants, a wide-brimmed hat, and sunglasses whenever you are outside. HEART DISEASE,  DIABETES, AND HIGH BLOOD PRESSURE   High blood pressure causes heart disease and increases the risk of stroke. High blood pressure is more likely to develop in:  People who have blood pressure in the high end of the normal range (130-139/85-89 mm Hg).  People who are overweight or obese.  People who are African American.  If you are 42-41 years of age, have your blood pressure checked every 3-5 years. If you are 38 years of age or older, have your blood pressure checked every year. You should have your blood pressure measured twice--once when you are at a hospital or clinic, and once when you are not at a hospital or clinic. Record the average of the two measurements. To check your blood pressure when you are not at a hospital or clinic, you can use:  An automated blood pressure machine at a pharmacy.  A home blood pressure monitor.  If you are between 10 years and 87 years old, ask your health care provider if you should take aspirin to prevent strokes.  Have  regular diabetes screenings. This involves taking a blood sample to check your fasting blood sugar level.  If you are at a normal weight and have a low risk for diabetes, have this test once every three years after 55 years of age.  If you are overweight and have a high risk for diabetes, consider being tested at a younger age or more often. PREVENTING INFECTION  Hepatitis B  If you have a higher risk for hepatitis B, you should be screened for this virus. You are considered at high risk for hepatitis B if:  You were born in a country where hepatitis B is common. Ask your health care provider which countries are considered high risk.  Your parents were born in a high-risk country, and you have not been immunized against hepatitis B (hepatitis B vaccine).  You have HIV or AIDS.  You use needles to inject street drugs.  You live with someone who has hepatitis B.  You have had sex with someone who has hepatitis B.  You get hemodialysis treatment.  You take certain medicines for conditions, including cancer, organ transplantation, and autoimmune conditions. Hepatitis C  Blood testing is recommended for:  Everyone born from [birth year] through 1965.  Anyone with known risk factors for hepatitis C. Sexually transmitted infections (STIs)  You should be screened for sexually transmitted infections (STIs) including gonorrhea and chlamydia if:  You are sexually active and are younger than 55 years of age.  You are older than 55 years of age and your health care provider tells you that you are at risk for this type of infection.  Your sexual activity has changed since you were last screened and you are at an increased risk for chlamydia or gonorrhea. Ask your health care provider if you are at risk.  If you do not have HIV, but are at risk, it may be recommended that you take a prescription medicine daily to prevent HIV infection. This is called pre-exposure prophylaxis (PrEP). You are  considered at risk if:  You are sexually active and do not regularly use condoms or know the HIV status of your partner(s).  You take drugs by injection.  You are sexually active with a partner who has HIV. Talk with your health care provider about whether you are at high risk of being infected with HIV. If you choose to begin PrEP, you should first be tested for HIV. You should then  be tested every 3 months for as long as you are taking PrEP.  PREGNANCY   If you are premenopausal and you may become pregnant, ask your health care provider about preconception counseling.  If you may become pregnant, take 400 to 800 micrograms (mcg) of folic acid every day.  If you want to prevent pregnancy, talk to your health care provider about birth control (contraception). OSTEOPOROSIS AND MENOPAUSE   Osteoporosis is a disease in which the bones lose minerals and strength with aging. This can result in serious bone fractures. Your risk for osteoporosis can be identified using a bone density scan.  If you are 33 years of age or older, or if you are at risk for osteoporosis and fractures, ask your health care provider if you should be screened.  Ask your health care provider whether you should take a calcium or vitamin D supplement to lower your risk for osteoporosis.  Menopause may have certain physical symptoms and risks.  Hormone replacement therapy may reduce some of these symptoms and risks. Talk to your health care provider about whether hormone replacement therapy is right for you.  HOME CARE INSTRUCTIONS   Schedule regular health, dental, and eye exams.  Stay current with your immunizations.   Do not use any tobacco products including cigarettes, chewing tobacco, or electronic cigarettes.  If you are pregnant, do not drink alcohol.  If you are breastfeeding, limit how much and how often you drink alcohol.  Limit alcohol intake to no more than 1 drink per day for nonpregnant women. One  drink equals 12 ounces of beer, 5 ounces of wine, or 1 ounces of hard liquor.  Do not use street drugs.  Do not share needles.  Ask your health care provider for help if you need support or information about quitting drugs.  Tell your health care provider if you often feel depressed.  Tell your health care provider if you have ever been abused or do not feel safe at home.   This information is not intended to replace advice given to you by your health care provider. Make sure you discuss any questions you have with your health care provider.   Document Released: 02/14/2011 Document Revised: 08/22/2014 Document Reviewed: 07/03/2013 Elsevier Interactive Patient Education [employer].

## 2016-03-08 NOTE — Assessment & Plan Note (Signed)
Currently diet controlled and last HgA1c 6.2 and checking today as well as foot exam, microalbumin to creatinine ratio. Declines pneumonia shot.

## 2016-03-08 NOTE — Progress Notes (Signed)
   Subjective:    Patient ID: Grace Lyons, female    DOB: [birth year], 54 y.o.   MRN: [id number]  HPI The patient is a 55 YO female coming in for wellness. No new concerns. Non-smoker still. Not exercising right now. Having left knee pain and getting multiple cortisone shots in both knees in the last several months.   PMH, [facility], social history reviewed and updated.   Review of Systems  Constitutional: Positive for unexpected weight change. Negative for activity change, appetite change and fever.       Weight gain  HENT: Negative.   Eyes: Negative.   Respiratory: Negative for cough, chest tightness, shortness of breath and wheezing.   Cardiovascular: Negative for chest pain, palpitations and leg swelling.  Gastrointestinal: Negative for abdominal distention, abdominal pain, constipation, diarrhea and nausea.  Musculoskeletal: Positive for arthralgias. Negative for back pain, gait problem and myalgias.  Skin: Negative.   Neurological: Negative.   Psychiatric/Behavioral: Negative.       Objective:   Physical Exam  Constitutional: She is oriented to person, place, and time. She appears well-developed and well-nourished.  Overweight  HENT:  Head: Normocephalic and atraumatic.  Eyes: EOM are normal.  Neck: Normal range of motion.  Cardiovascular: Normal rate and regular rhythm.   No murmur heard. Pulmonary/Chest: Effort normal. No respiratory distress. She has no wheezes. She has no rales.  Abdominal: Soft. Bowel sounds are normal. She exhibits no distension. There is no tenderness. There is no rebound.  Musculoskeletal: She exhibits no edema.  Cloth wrap on the left knee and left ankle  Neurological: She is alert and oriented to person, place, and time. Coordination normal.  Skin: Skin is warm and dry.  Psychiatric: She has a normal mood and affect.   Vitals:   03/08/16 1031 03/08/16 1101  BP: (!) 158/80 (!) 142/90  Pulse: 76   Resp: 12   Temp: 98.5 F (36.9 C)   TempSrc:  Oral   SpO2: 97%   Weight: 185 lb (83.9 kg)   Height: 5\' 2"  (1.575 m)       Assessment & Plan:

## 2016-03-08 NOTE — Progress Notes (Signed)
Pre visit review using our clinic review tool, if applicable. No additional management support is needed unless otherwise documented below in the visit note. 

## 2016-03-10 ENCOUNTER — Encounter: Payer: BLUE CROSS/BLUE SHIELD | Admitting: Internal Medicine

## 2016-03-16 LAB — HM PAP SMEAR: HM Pap smear: NOT DETECTED

## 2016-04-19 ENCOUNTER — Telehealth: Payer: Self-pay | Admitting: *Deleted

## 2016-04-19 NOTE — Telephone Encounter (Signed)
Pt request to fax OV notes to [facility] to appeal charges for an injection. OV notes faxed.

## 2016-07-01 ENCOUNTER — Telehealth: Payer: Self-pay | Admitting: Internal Medicine

## 2016-07-01 NOTE — Telephone Encounter (Signed)
Patient Name: Grace Lyons DOB: 08/27/1959 Initial Comment caller states she has fever 104.9 and vomiting Nurse Assessment Nurse: Ronnald Ramp, RN, Miranda Date/Time (Eastern Time): 07/01/2016 12:45:38 PM Confirm and document reason for call. If symptomatic, describe symptoms. You must click the next button to save text entered. ---Spoke with pt's sister. She states she has been feeling sick since Saturday. Seen in UC on Monday and prescribed Tamiflu and something for nausea (did not do flu test) She did not take Tamiflu yesterday because she had been having a bad headache. Temp 102.9, then she vomited and now having diarrhea. She also has had a cough. Has the patient traveled out of the country within the last 30 days? ---No Does the patient have any new or worsening symptoms? ---Yes Will a triage be completed? ---Yes Related visit to physician within the last 2 weeks? ---Yes Does the PT have any chronic conditions? (i.e. diabetes, asthma, etc.) ---No Is this a behavioral health or substance abuse call? ---No Guidelines Guideline Title Affirmed Question Affirmed Notes Influenza Follow-up Call Fever present > 3 days (72 hours) Final Disposition User See Physician within 24 Hours Jones, RN, Miranda Comments Told to hold the Tamiflu since it is causing stomach distress. Appt scheduled for the Sat clinic at 9:15am Referrals [facility] Disagree/Comply: Comply

## 2016-07-01 NOTE — Telephone Encounter (Signed)
Saturday clinic appt made

## 2016-07-02 ENCOUNTER — Encounter: Payer: Self-pay | Admitting: *Deleted

## 2016-07-02 ENCOUNTER — Emergency Department (HOSPITAL_COMMUNITY)
Admission: EM | Admit: 2016-07-02 | Discharge: 2016-07-03 | Disposition: A | Discharge status: Home / Self Care | Payer: BLUE CROSS/BLUE SHIELD | Attending: Emergency Medicine | Admitting: Emergency Medicine

## 2016-07-02 ENCOUNTER — Ambulatory Visit (INDEPENDENT_AMBULATORY_CARE_PROVIDER_SITE_OTHER): Payer: BLUE CROSS/BLUE SHIELD | Admitting: Family Medicine

## 2016-07-02 ENCOUNTER — Encounter: Payer: Self-pay | Admitting: Family Medicine

## 2016-07-02 ENCOUNTER — Encounter (HOSPITAL_COMMUNITY): Payer: Self-pay | Admitting: Emergency Medicine

## 2016-07-02 ENCOUNTER — Emergency Department (HOSPITAL_COMMUNITY): Payer: BLUE CROSS/BLUE SHIELD

## 2016-07-02 VITALS — BP 130/80 | HR 105 | Temp 99.5°F | Resp 18 | Ht 62.0 in | Wt 177.2 lb

## 2016-07-02 DIAGNOSIS — E119 Type 2 diabetes mellitus without complications: Secondary | ICD-10-CM | POA: Insufficient documentation

## 2016-07-02 DIAGNOSIS — Z791 Long term (current) use of non-steroidal anti-inflammatories (NSAID): Secondary | ICD-10-CM | POA: Diagnosis not present

## 2016-07-02 DIAGNOSIS — J181 Lobar pneumonia, unspecified organism: Secondary | ICD-10-CM

## 2016-07-02 DIAGNOSIS — J189 Pneumonia, unspecified organism: Secondary | ICD-10-CM

## 2016-07-02 DIAGNOSIS — Z87891 Personal history of nicotine dependence: Secondary | ICD-10-CM | POA: Diagnosis not present

## 2016-07-02 DIAGNOSIS — Z79899 Other long term (current) drug therapy: Secondary | ICD-10-CM | POA: Diagnosis not present

## 2016-07-02 DIAGNOSIS — R509 Fever, unspecified: Secondary | ICD-10-CM | POA: Diagnosis present

## 2016-07-02 LAB — URINE MICROSCOPIC-ADD ON

## 2016-07-02 LAB — CBC WITH DIFFERENTIAL/PLATELET
Basophils Absolute: 0 10*3/uL (ref 0.0–0.1)
Basophils Relative: 0 %
Eosinophils Absolute: 0.1 10*3/uL (ref 0.0–0.7)
Eosinophils Relative: 0 %
HCT: 33.2 % — ABNORMAL LOW (ref 36.0–46.0)
Hemoglobin: 11.6 g/dL — ABNORMAL LOW (ref 12.0–15.0)
Lymphocytes Relative: 10 %
Lymphs Abs: 1.4 10*3/uL (ref 0.7–4.0)
MCH: 30.5 pg (ref 26.0–34.0)
MCHC: 34.9 g/dL (ref 30.0–36.0)
MCV: 87.4 fL (ref 78.0–100.0)
Monocytes Absolute: 1.6 10*3/uL — ABNORMAL HIGH (ref 0.1–1.0)
Monocytes Relative: 11 %
Neutro Abs: 11.5 10*3/uL — ABNORMAL HIGH (ref 1.7–7.7)
Neutrophils Relative %: 79 %
Platelets: 383 10*3/uL (ref 150–400)
RBC: 3.8 MIL/uL — ABNORMAL LOW (ref 3.87–5.11)
RDW: 14.6 % (ref 11.5–15.5)
WBC: 14.6 10*3/uL — ABNORMAL HIGH (ref 4.0–10.5)

## 2016-07-02 LAB — COMPREHENSIVE METABOLIC PANEL
ALT: 61 U/L — ABNORMAL HIGH (ref 14–54)
AST: 45 U/L — ABNORMAL HIGH (ref 15–41)
Albumin: 3.8 g/dL (ref 3.5–5.0)
Alkaline Phosphatase: 112 U/L (ref 38–126)
Anion gap: 11 (ref 5–15)
BUN: 8 mg/dL (ref 6–20)
CO2: 22 mmol/L (ref 22–32)
Calcium: 10.3 mg/dL (ref 8.9–10.3)
Chloride: 98 mmol/L — ABNORMAL LOW (ref 101–111)
Creatinine, Ser: 0.74 mg/dL (ref 0.44–1.00)
GFR calc Af Amer: >60 mL/min (ref >60)
GFR calc non Af Amer: >60 mL/min (ref >60)
Glucose, Bld: 138 mg/dL — ABNORMAL HIGH (ref 65–99)
Potassium: 3.8 mmol/L (ref 3.5–5.1)
Sodium: 131 mmol/L — ABNORMAL LOW (ref 135–145)
Total Bilirubin: 0.7 mg/dL (ref 0.3–1.2)
Total Protein: 7.8 g/dL (ref 6.5–8.1)

## 2016-07-02 LAB — URINALYSIS, ROUTINE W REFLEX MICROSCOPIC
Bilirubin Urine: NEGATIVE
Glucose, UA: NEGATIVE mg/dL
Ketones, ur: NEGATIVE mg/dL
Nitrite: NEGATIVE
Protein, ur: NEGATIVE mg/dL
Specific Gravity, Urine: 1.016 (ref 1.005–1.030)
pH: 7 (ref 5.0–8.0)

## 2016-07-02 LAB — I-STAT CG4 LACTIC ACID, ED: Lactic Acid, Venous: 1.01 mmol/L (ref 0.5–1.9)

## 2016-07-02 MED ORDER — GUAIFENESIN-CODEINE 100-10 MG/5ML PO SYRP: 10.0000 mL | ORAL_SOLUTION | Freq: Three times a day (TID) | ORAL | 0 refills | Status: DC | PRN

## 2016-07-02 MED ORDER — ALBUTEROL SULFATE HFA 108 (90 BASE) MCG/ACT IN AERS: 2.0000 | INHALATION_SPRAY | Freq: Four times a day (QID) | RESPIRATORY_TRACT | 2 refills | Status: DC | PRN

## 2016-07-02 MED ORDER — DEXTROSE 5 % IV SOLN
1.0000 g | Freq: Once | INTRAVENOUS | Status: AC
  Administered 2016-07-02: 1 g via INTRAVENOUS
  Filled 2016-07-02: qty 10

## 2016-07-02 MED ORDER — ACETAMINOPHEN 325 MG PO TABS
650.0000 mg | ORAL_TABLET | Freq: Once | ORAL | Status: AC | PRN
  Administered 2016-07-02: 650 mg via ORAL
  Filled 2016-07-02: qty 2

## 2016-07-02 MED ORDER — SODIUM CHLORIDE 0.9 % IV BOLUS (SEPSIS)
1000.0000 mL | Freq: Once | INTRAVENOUS | Status: AC
  Administered 2016-07-02: 1000 mL via INTRAVENOUS

## 2016-07-02 MED ORDER — DOXYCYCLINE HYCLATE 100 MG PO TABS: 100.0000 mg | ORAL_TABLET | Freq: Two times a day (BID) | ORAL | 0 refills | Status: DC

## 2016-07-02 MED ORDER — AZITHROMYCIN 500 MG IV SOLR
500.0000 mg | Freq: Once | INTRAVENOUS | Status: AC
  Administered 2016-07-02: 500 mg via INTRAVENOUS
  Filled 2016-07-02: qty 500

## 2016-07-02 NOTE — Progress Notes (Signed)
Pre-visit discussion using our clinic review tool. No additional management support is needed unless otherwise documented below in the visit note.  

## 2016-07-02 NOTE — Patient Instructions (Signed)
Follow up as needed- particularly if not improving Start the Doxycycline twice daily- take w/ food Drink plenty of fluids REST! Alternate tylenol and ibuprofen as needed for body aches and fever Use the cough syrup as needed- will cause drowsiness Mucinex DM for cough control w/o drowsiness Use the Albuterol inhaler- 2 puffs every 4-6 hours only as needed for shortness of breath or wheezing Call with any questions or concerns- particularly if shortness of breath worsens Hang in there!!!

## 2016-07-02 NOTE — ED Triage Notes (Addendum)
Pt was seen in Urgent Care in [place] and dx with the flu without swab. Pt stated that fever, vomiting and cough,has been ongoing since Tuesday. Pt was seen by [facility] today and was prescribed Codeine and Doxycycline for Pneumonia, pt advised that there was not a chest xray performed. Last Tylenol was at 1300 today.

## 2016-07-02 NOTE — Progress Notes (Signed)
   Subjective:    Patient ID: Grace Lyons, female    DOB: 1960/11/05, 55 y.o.   MRN: [id number]  HPI Fever- sxs started 1 week ago w/ fever, chills, HA, decreased appetite.  Went to UC and was given Tamiflu and Zofran but not tested for flu.  Pt stopped Tamiflu due to HAs.  Pt has been traveling recently.  + vomiting.  Coughing at night- wet.  Drinking plenty of fluids.  No sinus pain/presure.  + SOB, cough is intermittently productive.  'i can feel a rattle'.  O2 93% today   Review of Systems For ROS see HPI     Objective:   Physical Exam  Constitutional: She is oriented to person, place, and time. She appears well-developed and well-nourished. No distress.  HENT:  Head: Normocephalic and atraumatic.  TMs normal bilaterally Mild nasal congestion Throat w/out erythema, edema, or exudate  Eyes: Conjunctivae and EOM are normal. Pupils are equal, round, and reactive to light.  Neck: Normal range of motion. Neck supple.  Cardiovascular: Normal rate, regular rhythm, normal heart sounds and intact distal pulses.   No murmur heard. Pulmonary/Chest: Effort normal. No respiratory distress. She has no wheezes. She has rales (coarse BS w/ faint crackles over LLL, CTA on R).  + hacking cough  Lymphadenopathy:    She has no cervical adenopathy.  Neurological: She is alert and oriented to person, place, and time.  Skin: Skin is warm and dry.  Psychiatric: She has a normal mood and affect. Her behavior is normal. Thought content normal.          Assessment & Plan:  CAP- new.  Suspect that pt does not have the flu but rather a CAP given her fevers, vomiting, cough, SOB, and 'rattle' in her chest.  LLL w/ faint crackles and coarse BS.  Start Doxy for CAP, cough syrup, albuterol inhaler prn.  Reviewed supportive care and red flags that should prompt return. Pt expressed understanding and is in agreement w/ plan.

## 2016-07-03 LAB — I-STAT CG4 LACTIC ACID, ED: Lactic Acid, Venous: 0.75 mmol/L (ref 0.5–1.9)

## 2016-07-03 NOTE — Discharge Instructions (Signed)
Take antibiotic as prescribed

## 2016-07-03 NOTE — ED Provider Notes (Signed)
[facility] DEPT Provider Note   CSN: [id number] Arrival date & time: 07/02/16  2007     History   Chief Complaint Chief Complaint  Patient presents with  . Fever    HPI Grace Lyons is a 55 y.o. female.  HPI  Last Saturday drove to [place], felt like low appetite, felt weak, high fevers 104 every day for one week.  Went to urgent care in [place] and thought it was flu, put on tamiflu  Finally came back home, and had appointment with [facility] and thought it was pneumonia and started doxycycline and has had one dose so far. Coughing up thick white mucus.   Past Medical History:  Diagnosis Date  . Cervical polyp   . Fibroid   . Menometrorrhagia   . OBESITY   . VITAMIN D DEFICIENCY     Patient Active Problem List   Diagnosis Date Noted  . Degenerative arthritis of knee, bilateral 12/09/2015  . Routine general medical examination at a health care facility 01/27/2015  . Other bilateral secondary osteoarthritis of knee 11/26/2014  . Pes planus of both feet 06/20/2014  . Loss of transverse plantar arch 06/20/2014  . Knee pain, left 12/29/2010  . Diabetes mellitus type 2, controlled, without complications ([facility]) 123XX123  . Vitamin D deficiency 12/23/2009  . OBESITY 12/23/2009    Past Surgical History:  Procedure Laterality Date  . COMBINED HYSTEROSCOPY DIAGNOSTIC / D&C    . ENDOMETRIAL BIOPSY  12/17/2008  . HYSTEROSCOPY W/D&C  08/31/2012   Procedure: DILATATION AND CURETTAGE /HYSTEROSCOPY;  Surgeon: Delice Lesch, MD;  Location: [facility];  Service: Gynecology;  Laterality: N/A;  with resectoscope  . TUBAL LIGATION      OB History    Gravida Para Term Preterm AB Living   1         1   SAB TAB Ectopic Multiple Live Births                   Home Medications    Prior to Admission medications   Medication Sig Start Date End Date Taking? Authorizing Provider  acetaminophen (TYLENOL) 500 MG tablet Take 1,000 mg by mouth every 6 (six) hours as needed  for mild pain, moderate pain or headache.   Yes Historical Provider, MD  albuterol (PROVENTIL HFA;VENTOLIN HFA) 108 (90 Base) MCG/ACT inhaler Inhale 2 puffs into the lungs every 6 (six) hours as needed for wheezing or shortness of breath. 07/02/16  Yes Midge Minium, MD  Cholecalciferol (VITAMIN D3) 1000 UNITS CAPS Take 1,000 Units by mouth daily.    Yes Historical Provider, MD  doxycycline (VIBRA-TABS) 100 MG tablet Take 1 tablet (100 mg total) by mouth 2 (two) times daily. 07/02/16  Yes Midge Minium, MD  glucosamine-chondroitin 500-400 MG tablet Take 1 tablet by mouth daily.    Yes Historical Provider, MD  guaiFENesin-codeine (ROBITUSSIN AC) 100-10 MG/5ML syrup Take 10 mLs by mouth 3 (three) times daily as needed for cough. 07/02/16  Yes Midge Minium, MD  ibuprofen (ADVIL,MOTRIN) 200 MG tablet Take 400 mg by mouth every 6 (six) hours as needed for headache, mild pain or moderate pain.   Yes Historical Provider, MD  Omega-3 Fatty Acids (OMEGA 3 PO) Take 1 capsule by mouth daily.    Yes Historical Provider, MD  ondansetron (ZOFRAN-ODT) 4 MG disintegrating tablet Take 4 mg by mouth every 6 (six) hours as needed for nausea or vomiting.  06/27/16  Yes Historical Provider, MD  oxyCODONE (  OXY IR/ROXICODONE) 5 MG immediate release tablet Take 5 mg by mouth every 6 (six) hours as needed for moderate pain or severe pain.  04/06/16  Yes Historical Provider, MD  VITAMIN A PO Take 1 tablet by mouth daily.    Yes Historical Provider, MD  vitamin E 200 UNIT capsule Take 200 Units by mouth daily.     Yes Historical Provider, MD    Family History Family History  Problem Relation Age of Onset  . Diabetes Mother   . Hypertension Mother   . Diabetes Father   . Hypertension Father     Social History Social History  Substance Use Topics  . Smoking status: Former Smoker    Quit date: 01/12/1996  . Smokeless tobacco: Never Used     Comment: Single, lives alone. 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 Alcohol use No     Allergies   Patient has no known allergies.   Review of Systems Review of Systems  Constitutional: Positive for appetite change, fatigue and fever.  HENT: Negative for congestion.   Respiratory: Positive for cough and shortness of breath.   Cardiovascular: Negative for chest pain and leg swelling.  Gastrointestinal: Positive for nausea (once yesterday once today) and vomiting (once yesterday, once today). Negative for abdominal pain, constipation (normal BM today) and diarrhea.  Genitourinary: Negative for difficulty urinating.  Musculoskeletal: Positive for arthralgias.     Physical Exam Updated Vital Signs BP 121/65 (BP Location: Left Arm)   Pulse 86   Temp 99.1 F (37.3 C) (Oral)   Resp 18   Ht 5\' 2"  (1.575 m)   Wt 177 lb (80.3 kg)   LMP 08/14/2012   SpO2 96%   BMI 32.37 kg/m   Physical Exam  Constitutional: She is oriented to person, place, and time. She appears well-developed and well-nourished. No distress.  HENT:  Head: Normocephalic and atraumatic.  Eyes: Conjunctivae and EOM are normal.  Neck: Normal range of motion.  Cardiovascular: Normal rate, regular rhythm, normal heart sounds and intact distal pulses.  Exam reveals no gallop and no friction rub.   No murmur heard. Pulmonary/Chest: Effort normal. No respiratory distress. She has no wheezes. She has rales (LLL).  Abdominal: Soft. She exhibits no distension. There is no tenderness. There is no guarding.  Musculoskeletal: She exhibits no edema or tenderness.  Neurological: She is alert and oriented to person, place, and time.  Skin: Skin is warm and dry. No rash noted. She is not diaphoretic. No erythema.  Nursing note and vitals reviewed.    ED Treatments / Results  Labs (all labs ordered are listed, but only abnormal results are displayed) Labs Reviewed  COMPREHENSIVE METABOLIC PANEL - Abnormal; Notable for the following:        Result Value   Sodium 131 (*)    Chloride 98 (*)    Glucose, Bld 138 (*)    AST 45 (*)    ALT 61 (*)    All other components within normal limits  URINALYSIS, ROUTINE W REFLEX MICROSCOPIC (NOT AT [facility] - Abnormal; Notable for the following:    Hgb urine dipstick MODERATE (*)    Leukocytes, UA SMALL (*)    All other components within normal limits  URINE MICROSCOPIC-ADD ON - Abnormal; Notable for the following:    Squamous Epithelial / LPF 0-5 (*)    Bacteria, UA FEW (*)    All other components within normal  limits  CBC WITH DIFFERENTIAL/PLATELET - Abnormal; Notable for the following:    WBC 14.6 (*)    RBC 3.80 (*)    Hemoglobin 11.6 (*)    HCT 33.2 (*)    Neutro Abs 11.5 (*)    Monocytes Absolute 1.6 (*)    All other components within normal limits  CULTURE, BLOOD (ROUTINE X 2)  CULTURE, BLOOD (ROUTINE X 2)  URINE CULTURE  I-STAT CG4 LACTIC ACID, ED  I-STAT CG4 LACTIC ACID, ED    EKG  EKG Interpretation None       Radiology Dg Chest 2 View  Result Date: 07/02/2016 CLINICAL DATA:  Fever and cough with chest pain for 1 week EXAM: CHEST  2 VIEW COMPARISON:  September 07, 2011 FINDINGS: There is airspace opacity in the left lower lobe consistent with pneumonia. Lungs elsewhere are clear. Heart size and pulmonary vascularity are normal. No adenopathy. No pneumothorax. No bone lesions. IMPRESSION: Left lower lobe infiltrate consistent with pneumonia. Lungs elsewhere clear. Cardiac silhouette within normal limits. Followup PA and lateral chest radiographs recommended in 3-4 weeks following trial of antibiotic therapy to ensure resolution and exclude underlying malignancy. Electronically Signed   By: Lowella Grip III M.D.   On: 07/02/2016 21:28    Procedures Procedures (including critical care time)  Medications Ordered in ED Medications  acetaminophen (TYLENOL) tablet 650 mg (650 mg Oral Given 07/02/16 2041)  sodium chloride 0.9 % bolus 1,000 mL (0 mLs Intravenous  Stopped 07/03/16 0118)  cefTRIAXone (ROCEPHIN) 1 g in dextrose 5 % 50 mL IVPB (0 g Intravenous Stopped 07/02/16 2300)  azithromycin (ZITHROMAX) 500 mg in dextrose 5 % 250 mL IVPB (0 mg Intravenous Stopped 07/03/16 0040)     Initial Impression / Assessment and Plan / ED Course  I have reviewed the triage vital signs and the nursing notes.  Pertinent labs & imaging results that were available during my care of the patient were reviewed by me and considered in my medical decision making (see chart for details).  Clinical Course     55 year old female with no significant medical history presents with concerns for coughing and fever for 1 week. Febrile to 102.7 on arrival to the emergency department. Chest x-ray concerning for left lower lobe pneumonia. Patient was given IV fluids, Rocephin and azithromycin.  Her lactic acid is within normal limits, her oxygenation is normal, vital signs improved, and she is appropriate for outpatient management. On further history, it was found that she was initiated on doxycycline by her primary care physician, and feel this is an appropriate choice for her to continue as an outpatient next week.  Patient discharged in stable condition with understanding of reasons to return.   Final Clinical Impressions(s) / ED Diagnoses   Final diagnoses:  Community acquired pneumonia of left lower lobe of lung [facility])    New Prescriptions Discharge Medication List as of 07/03/2016  1:28 AM       Gareth Morgan, MD 07/03/16 1346

## 2016-07-04 LAB — URINE CULTURE: Culture: NO GROWTH

## 2016-07-05 ENCOUNTER — Encounter: Payer: Self-pay | Admitting: Internal Medicine

## 2016-07-05 ENCOUNTER — Ambulatory Visit (INDEPENDENT_AMBULATORY_CARE_PROVIDER_SITE_OTHER): Payer: BLUE CROSS/BLUE SHIELD | Admitting: Internal Medicine

## 2016-07-05 VITALS — BP 134/64 | HR 95 | Temp 98.6°F | Resp 16 | Ht 62.0 in | Wt 177.0 lb

## 2016-07-05 DIAGNOSIS — J181 Lobar pneumonia, unspecified organism: Secondary | ICD-10-CM

## 2016-07-05 DIAGNOSIS — J189 Pneumonia, unspecified organism: Secondary | ICD-10-CM

## 2016-07-05 MED ORDER — BENZONATATE 100 MG PO CAPS: 100.0000 mg | ORAL_CAPSULE | Freq: Three times a day (TID) | ORAL | 0 refills | Status: DC | PRN

## 2016-07-05 NOTE — Progress Notes (Signed)
Pre visit review using our clinic review tool, if applicable. No additional management support is needed unless otherwise documented below in the visit note. 

## 2016-07-05 NOTE — Assessment & Plan Note (Addendum)
Still taking doxycycline and recovering well. She will finish and return for CXR in about 4 weeks. Cleared to return to work tomorrow as fevers gone and endurance is good. Cough syrup making her sick so tessalon perles instead.

## 2016-07-05 NOTE — Progress Notes (Signed)
   Subjective:    Patient ID: Grace Lyons, female    DOB: 04-18-1961, 55 y.o.   MRN: [id number]  HPI The patient is a 55 YO female coming in for ER follow up (seen in clinic and then at the ER for CAP, treated with antibiotics). She was given a dose of IV antibiotics in the ER and then continued on doxycycline for LLL pneumonia. She is a former smoker and has not resumed. Still with some cough and still taking antibiotics. Mild SOB but she is able to do all activities. She wants to go back to work tomorrow and needs a note that it is safe for her to return. She is not taking tylenol or ibuprofen and no more fevers in 2-3 days.   Review of Systems  Constitutional: Negative for activity change, appetite change, chills, fatigue, fever and unexpected weight change.  HENT: Negative.   Eyes: Negative.   Respiratory: Positive for cough and shortness of breath. Negative for chest tightness and wheezing.   Cardiovascular: Negative.   Gastrointestinal: Negative.   Musculoskeletal: Negative.   Neurological: Negative.       Objective:   Physical Exam  Constitutional: She is oriented to person, place, and time. She appears well-developed and well-nourished.  HENT:  Head: Normocephalic and atraumatic.  Eyes: EOM are normal.  Neck: Normal range of motion.  Cardiovascular: Normal rate and regular rhythm.   Pulmonary/Chest: Effort normal. No respiratory distress. She has no wheezes. She has no rales.  Some rhonchi in the left base  Abdominal: Soft. She exhibits no distension. There is no tenderness. There is no rebound.  Neurological: She is alert and oriented to person, place, and time. Coordination normal.  Skin: Skin is warm and dry.   Vitals:   07/05/16 1325  BP: 134/64  Pulse: 95  Resp: 16  Temp: 98.6 F (37 C)  TempSrc: Oral  SpO2: 91%  Weight: 177 lb (80.3 kg)  Height: 5\' 2"  (1.575 m)      Assessment & Plan:

## 2016-07-05 NOTE — Patient Instructions (Addendum)
We have sent in the tessalon perles for the cough which you can take up to 3 times per day.  Come back about 4 weeks after finishing the antibiotics for the chest x-ray (the order is already in so just come to the office anytime and down to the basement to the x-ray).   Think about getting the flu shot when you come back in about 4 weeks.

## 2016-07-07 LAB — CULTURE, BLOOD (ROUTINE X 2)
Culture: NO GROWTH
Culture: NO GROWTH

## 2016-07-27 ENCOUNTER — Ambulatory Visit (INDEPENDENT_AMBULATORY_CARE_PROVIDER_SITE_OTHER)
Admission: RE | Admit: 2016-07-27 | Discharge: 2016-07-27 | Disposition: A | Discharge status: Home / Self Care | Payer: BLUE CROSS/BLUE SHIELD | Source: Ambulatory Visit | Attending: Internal Medicine | Admitting: Internal Medicine

## 2016-07-27 DIAGNOSIS — J181 Lobar pneumonia, unspecified organism: Secondary | ICD-10-CM | POA: Diagnosis not present

## 2016-07-27 DIAGNOSIS — J189 Pneumonia, unspecified organism: Secondary | ICD-10-CM

## 2016-11-30 DIAGNOSIS — M170 Bilateral primary osteoarthritis of knee: Secondary | ICD-10-CM | POA: Diagnosis not present

## 2016-12-12 ENCOUNTER — Other Ambulatory Visit: Payer: Self-pay | Admitting: Internal Medicine

## 2016-12-12 DIAGNOSIS — Z1231 Encounter for screening mammogram for malignant neoplasm of breast: Secondary | ICD-10-CM

## 2017-01-16 ENCOUNTER — Encounter (HOSPITAL_COMMUNITY): Payer: Self-pay

## 2017-01-16 ENCOUNTER — Emergency Department (HOSPITAL_COMMUNITY)
Admission: EM | Admit: 2017-01-16 | Discharge: 2017-01-16 | Disposition: A | Discharge status: Home / Self Care | Payer: BLUE CROSS/BLUE SHIELD | Attending: Emergency Medicine | Admitting: Emergency Medicine

## 2017-01-16 ENCOUNTER — Emergency Department (HOSPITAL_COMMUNITY): Payer: BLUE CROSS/BLUE SHIELD

## 2017-01-16 DIAGNOSIS — Y999 Unspecified external cause status: Secondary | ICD-10-CM | POA: Diagnosis not present

## 2017-01-16 DIAGNOSIS — Y92410 Unspecified street and highway as the place of occurrence of the external cause: Secondary | ICD-10-CM | POA: Diagnosis not present

## 2017-01-16 DIAGNOSIS — Z87891 Personal history of nicotine dependence: Secondary | ICD-10-CM | POA: Diagnosis not present

## 2017-01-16 DIAGNOSIS — T2040XA Corrosion of unspecified degree of head, face, and neck, unspecified site, initial encounter: Secondary | ICD-10-CM | POA: Diagnosis not present

## 2017-01-16 DIAGNOSIS — T310 Burns involving less than 10% of body surface: Secondary | ICD-10-CM | POA: Diagnosis not present

## 2017-01-16 DIAGNOSIS — S8002XA Contusion of left knee, initial encounter: Secondary | ICD-10-CM | POA: Insufficient documentation

## 2017-01-16 DIAGNOSIS — M25552 Pain in left hip: Secondary | ICD-10-CM | POA: Diagnosis not present

## 2017-01-16 DIAGNOSIS — T59891A Toxic effect of other specified gases, fumes and vapors, accidental (unintentional), initial encounter: Secondary | ICD-10-CM | POA: Diagnosis not present

## 2017-01-16 DIAGNOSIS — M25562 Pain in left knee: Secondary | ICD-10-CM | POA: Diagnosis not present

## 2017-01-16 DIAGNOSIS — Y9389 Activity, other specified: Secondary | ICD-10-CM | POA: Diagnosis not present

## 2017-01-16 DIAGNOSIS — E119 Type 2 diabetes mellitus without complications: Secondary | ICD-10-CM | POA: Insufficient documentation

## 2017-01-16 DIAGNOSIS — T2046XA Corrosion of unspecified degree of forehead and cheek, initial encounter: Secondary | ICD-10-CM | POA: Insufficient documentation

## 2017-01-16 DIAGNOSIS — S199XXA Unspecified injury of neck, initial encounter: Secondary | ICD-10-CM | POA: Diagnosis not present

## 2017-01-16 DIAGNOSIS — M542 Cervicalgia: Secondary | ICD-10-CM | POA: Diagnosis not present

## 2017-01-16 DIAGNOSIS — S8992XA Unspecified injury of left lower leg, initial encounter: Secondary | ICD-10-CM | POA: Diagnosis not present

## 2017-01-16 DIAGNOSIS — S79912A Unspecified injury of left hip, initial encounter: Secondary | ICD-10-CM | POA: Diagnosis not present

## 2017-01-16 MED ORDER — ACETAMINOPHEN 325 MG PO TABS
650.0000 mg | ORAL_TABLET | Freq: Once | ORAL | Status: AC
  Administered 2017-01-16: 650 mg via ORAL
  Filled 2017-01-16: qty 2

## 2017-01-16 MED ORDER — IBUPROFEN 800 MG PO TABS
800.0000 mg | ORAL_TABLET | Freq: Once | ORAL | Status: AC
  Administered 2017-01-16: 800 mg via ORAL
  Filled 2017-01-16: qty 1

## 2017-01-16 NOTE — Discharge Instructions (Signed)
Use ibuprofen every 6-8 hours for 48 hours. Shower off all chemicals when you get home. Cold therapy, compression and elevation of left knee
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
## 2017-01-16 NOTE — ED Notes (Signed)
Walked patient to the bathroom patient did well 
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
## 2017-01-16 NOTE — ED Notes (Signed)
Patient transported to X-ray 

## 2017-01-23 DIAGNOSIS — S8002XA Contusion of left knee, initial encounter: Secondary | ICD-10-CM | POA: Diagnosis not present

## 2017-01-23 DIAGNOSIS — S7002XA Contusion of left hip, initial encounter: Secondary | ICD-10-CM | POA: Diagnosis not present

## 2017-01-23 DIAGNOSIS — S335XXA Sprain of ligaments of lumbar spine, initial encounter: Secondary | ICD-10-CM | POA: Diagnosis not present

## 2017-01-26 ENCOUNTER — Ambulatory Visit
Admission: RE | Admit: 2017-01-26 | Discharge: 2017-01-26 | Disposition: A | Discharge status: Home / Self Care | Payer: BLUE CROSS/BLUE SHIELD | Source: Ambulatory Visit | Attending: Internal Medicine | Admitting: Internal Medicine

## 2017-01-26 DIAGNOSIS — Z1231 Encounter for screening mammogram for malignant neoplasm of breast: Secondary | ICD-10-CM | POA: Diagnosis not present

## 2017-01-27 DIAGNOSIS — M25562 Pain in left knee: Secondary | ICD-10-CM | POA: Diagnosis not present

## 2017-01-27 DIAGNOSIS — S335XXD Sprain of ligaments of lumbar spine, subsequent encounter: Secondary | ICD-10-CM | POA: Diagnosis not present

## 2017-01-27 DIAGNOSIS — S7002XD Contusion of left hip, subsequent encounter: Secondary | ICD-10-CM | POA: Diagnosis not present

## 2017-01-31 ENCOUNTER — Encounter: Payer: Self-pay | Admitting: Family

## 2017-02-01 DIAGNOSIS — S7002XD Contusion of left hip, subsequent encounter: Secondary | ICD-10-CM | POA: Diagnosis not present

## 2017-02-01 DIAGNOSIS — M25562 Pain in left knee: Secondary | ICD-10-CM | POA: Diagnosis not present

## 2017-02-01 DIAGNOSIS — S335XXD Sprain of ligaments of lumbar spine, subsequent encounter: Secondary | ICD-10-CM | POA: Diagnosis not present

## 2017-02-02 DIAGNOSIS — S7002XD Contusion of left hip, subsequent encounter: Secondary | ICD-10-CM | POA: Diagnosis not present

## 2017-02-02 DIAGNOSIS — S335XXD Sprain of ligaments of lumbar spine, subsequent encounter: Secondary | ICD-10-CM | POA: Diagnosis not present

## 2017-02-02 DIAGNOSIS — M25562 Pain in left knee: Secondary | ICD-10-CM | POA: Diagnosis not present

## 2017-02-03 ENCOUNTER — Telehealth: Payer: Self-pay | Admitting: Internal Medicine

## 2017-02-03 NOTE — Telephone Encounter (Signed)
Can orders be put in for Pts lab work for her CPE on 7/27, she wants to go to the lab first

## 2017-02-03 NOTE — Telephone Encounter (Signed)
Please inform her since Dr. Sharlet Salina is out of office. She is going to need to be seen first before any labs can be put in. Thank you.

## 2017-02-21 DIAGNOSIS — M25562 Pain in left knee: Secondary | ICD-10-CM | POA: Diagnosis not present

## 2017-03-09 ENCOUNTER — Encounter: Payer: BLUE CROSS/BLUE SHIELD | Admitting: Nurse Practitioner

## 2017-03-10 ENCOUNTER — Other Ambulatory Visit (INDEPENDENT_AMBULATORY_CARE_PROVIDER_SITE_OTHER): Payer: BLUE CROSS/BLUE SHIELD

## 2017-03-10 ENCOUNTER — Ambulatory Visit (INDEPENDENT_AMBULATORY_CARE_PROVIDER_SITE_OTHER): Payer: BLUE CROSS/BLUE SHIELD | Admitting: Nurse Practitioner

## 2017-03-10 ENCOUNTER — Encounter: Payer: Self-pay | Admitting: Nurse Practitioner

## 2017-03-10 VITALS — BP 118/78 | HR 68 | Temp 98.3°F | Ht 62.0 in | Wt 175.0 lb

## 2017-03-10 DIAGNOSIS — Z136 Encounter for screening for cardiovascular disorders: Secondary | ICD-10-CM

## 2017-03-10 DIAGNOSIS — E559 Vitamin D deficiency, unspecified: Secondary | ICD-10-CM

## 2017-03-10 DIAGNOSIS — Z23 Encounter for immunization: Secondary | ICD-10-CM | POA: Diagnosis not present

## 2017-03-10 DIAGNOSIS — E119 Type 2 diabetes mellitus without complications: Secondary | ICD-10-CM

## 2017-03-10 DIAGNOSIS — Z13220 Encounter for screening for lipoid disorders: Secondary | ICD-10-CM | POA: Diagnosis not present

## 2017-03-10 DIAGNOSIS — Z0001 Encounter for general adult medical examination with abnormal findings: Secondary | ICD-10-CM | POA: Diagnosis not present

## 2017-03-10 DIAGNOSIS — M542 Cervicalgia: Secondary | ICD-10-CM | POA: Diagnosis not present

## 2017-03-10 LAB — LIPID PANEL
Cholesterol: 157 mg/dL (ref 0–200)
HDL: 65.8 mg/dL (ref >39.00)
LDL Cholesterol: 80 mg/dL (ref 0–99)
NonHDL: 90.88
Total CHOL/HDL Ratio: 2
Triglycerides: 52 mg/dL (ref 0.0–149.0)
VLDL: 10.4 mg/dL (ref 0.0–40.0)

## 2017-03-10 LAB — COMPREHENSIVE METABOLIC PANEL
ALT: 18 U/L (ref 0–35)
AST: 15 U/L (ref 0–37)
Albumin: 4.3 g/dL (ref 3.5–5.2)
Alkaline Phosphatase: 52 U/L (ref 39–117)
BUN: 9 mg/dL (ref 6–23)
CO2: 28 mEq/L (ref 19–32)
Calcium: 10.2 mg/dL (ref 8.4–10.5)
Chloride: 106 mEq/L (ref 96–112)
Creatinine, Ser: 0.77 mg/dL (ref 0.40–1.20)
GFR: 99.53 mL/min (ref >60.00)
Glucose, Bld: 93 mg/dL (ref 70–99)
Potassium: 4.1 mEq/L (ref 3.5–5.1)
Sodium: 140 mEq/L (ref 135–145)
Total Bilirubin: 0.6 mg/dL (ref 0.2–1.2)
Total Protein: 6.9 g/dL (ref 6.0–8.3)

## 2017-03-10 LAB — CBC
HCT: 39 % (ref 36.0–46.0)
Hemoglobin: 12.8 g/dL (ref 12.0–15.0)
MCHC: 32.9 g/dL (ref 30.0–36.0)
MCV: 95.9 fl (ref 78.0–100.0)
Platelets: 252 10*3/uL (ref 150.0–400.0)
RBC: 4.07 Mil/uL (ref 3.87–5.11)
RDW: 14.3 % (ref 11.5–15.5)
WBC: 4.6 10*3/uL (ref 4.0–10.5)

## 2017-03-10 LAB — HEMOGLOBIN A1C: Hgb A1c MFr Bld: 6.4 % (ref 4.6–6.5)

## 2017-03-10 LAB — TSH: TSH: 0.38 u[IU]/mL (ref 0.35–4.50)

## 2017-03-10 NOTE — Patient Instructions (Signed)
Continue warm and cold compress as needed.  You also use tylenol or ibuprofen or topical aspercreme or lidocaine gel for left side neck pain. These are all OTC medications.  Go yo basement for blood draw.  You will be contacted to schedule appt with PT.  Cervical Strain and Sprain Rehab Ask your health care provider which exercises are safe for you. Do exercises exactly as told by your health care provider and adjust them as directed. It is normal to feel mild stretching, pulling, tightness, or discomfort as you do these exercises, but you should stop right away if you feel sudden pain or your pain gets worse.Do not begin these exercises until told by your health care provider. Stretching and range of motion exercises These exercises warm up your muscles and joints and improve the movement and flexibility of your neck. These exercises also help to relieve pain, numbness, and tingling. Exercise A: Cervical side bend  1. Using good posture, sit on a stable chair or stand up. 2. Without moving your shoulders, slowly tilt your left / right ear to your shoulder until you feel a stretch in your neck muscles. You should be looking straight ahead. 3. Hold for __________ seconds. 4. Repeat with the other side of your neck. Repeat __________ times. Complete this exercise __________ times a day. Exercise B: Cervical rotation  1. Using good posture, sit on a stable chair or stand up. 2. Slowly turn your head to the side as if you are looking over your left / right shoulder. ? Keep your eyes level with the ground. ? Stop when you feel a stretch along the side and the back of your neck. 3. Hold for __________ seconds. 4. Repeat this by turning to your other side. Repeat __________ times. Complete this exercise __________ times a day. Exercise C: Thoracic extension and pectoral stretch 1. Roll a towel or a small blanket so it is about 4 inches (10 cm) in diameter. 2. Lie down on your back on a firm  surface. 3. Put the towel lengthwise, under your spine in the middle of your back. It should not be not under your shoulder blades. The towel should line up with your spine from your middle back to your lower back. 4. Put your hands behind your head and let your elbows fall out to your sides. 5. Hold for __________ seconds. Repeat __________ times. Complete this exercise __________ times a day. Strengthening exercises These exercises build strength and endurance in your neck. Endurance is the ability to use your muscles for a long time, even after your muscles get tired. Exercise D: Upper cervical flexion, isometric 1. Lie on your back with a thin pillow behind your head and a small rolled-up towel under your neck. 2. Gently tuck your chin toward your chest and nod your head down to look toward your feet. Do not lift your head off the pillow. 3. Hold for __________ seconds. 4. Release the tension slowly. Relax your neck muscles completely before you repeat this exercise. Repeat __________ times. Complete this exercise __________ times a day. Exercise E: Cervical extension, isometric  1. Stand about 6 inches (15 cm) away from a wall, with your back facing the wall. 2. Place a soft object, about 6-8 inches (15-20 cm) in diameter, between the back of your head and the wall. A soft object could be a small pillow, a ball, or a folded towel. 3. Gently tilt your head back and press into the soft object. Keep your  jaw and forehead relaxed. 4. Hold for __________ seconds. 5. Release the tension slowly. Relax your neck muscles completely before you repeat this exercise. Repeat __________ times. Complete this exercise __________ times a day. Posture and body mechanics  Body mechanics refers to the movements and positions of your body while you do your daily activities. Posture is part of body mechanics. Good posture and healthy body mechanics can help to relieve stress in your body's tissues and joints.  Good posture means that your spine is in its natural S-curve position (your spine is neutral), your shoulders are pulled back slightly, and your head is not tipped forward. The following are general guidelines for applying improved posture and body mechanics to your everyday activities. Standing  When standing, keep your spine neutral and keep your feet about hip-width apart. Keep a slight bend in your knees. Your ears, shoulders, and hips should line up.  When you do a task in which you stand in one place for a long time, place one foot up on a stable object that is 2-4 inches (5-10 cm) high, such as a footstool. This helps keep your spine neutral. Sitting   When sitting, keep your spine neutral and your keep feet flat on the floor. Use a footrest, if necessary, and keep your thighs parallel to the floor. Avoid rounding your shoulders, and avoid tilting your head forward.  When working at a desk or a computer, keep your desk at a height where your hands are slightly lower than your elbows. Slide your chair under your desk so you are close enough to maintain good posture.  When working at a computer, place your monitor at a height where you are looking straight ahead and you do not have to tilt your head forward or downward to look at the screen. Resting When lying down and resting, avoid positions that are most painful for you. Try to support your neck in a neutral position. You can use a contour pillow or a small rolled-up towel. Your pillow should support your neck but not push on it. This information is not intended to replace advice given to you by your health care provider. Make sure you discuss any questions you have with your health care provider. Document Released: 08/01/2005 Document Revised: 04/07/2016 Document Reviewed: 07/08/2015 Elsevier Interactive Patient Education  [employer].

## 2017-03-10 NOTE — Assessment & Plan Note (Signed)
Controlled with diet. HgbA1c of 6.4
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
## 2017-03-14 LAB — VITAMIN D 1,25 DIHYDROXY
Vitamin D 1, 25 (OH)2 Total: 58 pg/mL (ref 18–72)
Vitamin D2 1, 25 (OH)2: <8 pg/mL
Vitamin D3 1, 25 (OH)2: 58 pg/mL

## 2017-03-22 DIAGNOSIS — Z124 Encounter for screening for malignant neoplasm of cervix: Secondary | ICD-10-CM | POA: Diagnosis not present

## 2017-03-22 DIAGNOSIS — Z01419 Encounter for gynecological examination (general) (routine) without abnormal findings: Secondary | ICD-10-CM | POA: Diagnosis not present

## 2017-03-22 DIAGNOSIS — Z6831 Body mass index (BMI) 31.0-31.9, adult: Secondary | ICD-10-CM | POA: Diagnosis not present

## 2017-03-27 ENCOUNTER — Encounter: Payer: Self-pay | Admitting: Internal Medicine

## 2017-03-27 NOTE — Progress Notes (Signed)
Abstracted and sent to scan  

## 2017-04-21 ENCOUNTER — Telehealth: Payer: Self-pay | Admitting: Internal Medicine

## 2017-04-21 ENCOUNTER — Emergency Department (HOSPITAL_COMMUNITY): Payer: BLUE CROSS/BLUE SHIELD

## 2017-04-21 ENCOUNTER — Encounter (HOSPITAL_COMMUNITY): Payer: Self-pay | Admitting: Emergency Medicine

## 2017-04-21 ENCOUNTER — Emergency Department (HOSPITAL_COMMUNITY)
Admission: EM | Admit: 2017-04-21 | Discharge: 2017-04-21 | Disposition: A | Discharge status: Home / Self Care | Payer: BLUE CROSS/BLUE SHIELD | Attending: Emergency Medicine | Admitting: Emergency Medicine

## 2017-04-21 DIAGNOSIS — Y9389 Activity, other specified: Secondary | ICD-10-CM | POA: Insufficient documentation

## 2017-04-21 DIAGNOSIS — Z87891 Personal history of nicotine dependence: Secondary | ICD-10-CM | POA: Diagnosis not present

## 2017-04-21 DIAGNOSIS — R072 Precordial pain: Secondary | ICD-10-CM | POA: Diagnosis not present

## 2017-04-21 DIAGNOSIS — Y998 Other external cause status: Secondary | ICD-10-CM | POA: Insufficient documentation

## 2017-04-21 DIAGNOSIS — Y92410 Unspecified street and highway as the place of occurrence of the external cause: Secondary | ICD-10-CM | POA: Insufficient documentation

## 2017-04-21 DIAGNOSIS — E119 Type 2 diabetes mellitus without complications: Secondary | ICD-10-CM | POA: Diagnosis not present

## 2017-04-21 DIAGNOSIS — R079 Chest pain, unspecified: Secondary | ICD-10-CM | POA: Diagnosis not present

## 2017-04-21 DIAGNOSIS — S20212A Contusion of left front wall of thorax, initial encounter: Secondary | ICD-10-CM

## 2017-04-21 DIAGNOSIS — Z79899 Other long term (current) drug therapy: Secondary | ICD-10-CM | POA: Insufficient documentation

## 2017-04-21 HISTORY — DX: Unspecified osteoarthritis, unspecified site: M19.90

## 2017-04-21 LAB — CBC
HCT: 35.9 % — ABNORMAL LOW (ref 36.0–46.0)
Hemoglobin: 11.9 g/dL — ABNORMAL LOW (ref 12.0–15.0)
MCH: 30.7 pg (ref 26.0–34.0)
MCHC: 33.1 g/dL (ref 30.0–36.0)
MCV: 92.5 fL (ref 78.0–100.0)
Platelets: 256 10*3/uL (ref 150–400)
RBC: 3.88 MIL/uL (ref 3.87–5.11)
RDW: 14.3 % (ref 11.5–15.5)
WBC: 5.9 10*3/uL (ref 4.0–10.5)

## 2017-04-21 LAB — I-STAT TROPONIN, ED: Troponin i, poc: 0.02 ng/mL (ref 0.00–0.08)

## 2017-04-21 LAB — BASIC METABOLIC PANEL
Anion gap: 7 (ref 5–15)
BUN: 11 mg/dL (ref 6–20)
CO2: 24 mmol/L (ref 22–32)
Calcium: 10 mg/dL (ref 8.9–10.3)
Chloride: 107 mmol/L (ref 101–111)
Creatinine, Ser: 0.84 mg/dL (ref 0.44–1.00)
GFR calc Af Amer: >60 mL/min (ref >60)
GFR calc non Af Amer: >60 mL/min (ref >60)
Glucose, Bld: 100 mg/dL — ABNORMAL HIGH (ref 65–99)
Potassium: 3.5 mmol/L (ref 3.5–5.1)
Sodium: 138 mmol/L (ref 135–145)

## 2017-04-21 MED ORDER — IBUPROFEN 600 MG PO TABS: 600.0000 mg | ORAL_TABLET | Freq: Four times a day (QID) | ORAL | 0 refills | Status: DC | PRN
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
## 2017-04-21 NOTE — Telephone Encounter (Signed)
Agree with ER.
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
## 2017-04-26 LAB — HM DIABETES EYE EXAM

## 2017-05-03 ENCOUNTER — Encounter: Payer: Self-pay | Admitting: Internal Medicine

## 2017-05-03 NOTE — Progress Notes (Signed)
Abstracted and sent to scan  

## 2017-05-22 DIAGNOSIS — M170 Bilateral primary osteoarthritis of knee: Secondary | ICD-10-CM | POA: Diagnosis not present

## 2017-06-13 ENCOUNTER — Ambulatory Visit (INDEPENDENT_AMBULATORY_CARE_PROVIDER_SITE_OTHER): Payer: BLUE CROSS/BLUE SHIELD | Admitting: Internal Medicine

## 2017-06-13 ENCOUNTER — Encounter: Payer: Self-pay | Admitting: Internal Medicine

## 2017-06-13 VITALS — BP 142/86 | HR 70 | Temp 98.5°F | Ht 62.0 in | Wt 177.0 lb

## 2017-06-13 DIAGNOSIS — M25562 Pain in left knee: Secondary | ICD-10-CM

## 2017-06-13 DIAGNOSIS — Z23 Encounter for immunization: Secondary | ICD-10-CM | POA: Diagnosis not present

## 2017-06-13 MED ORDER — TRAMADOL HCL 50 MG PO TABS: 50.0000 mg | ORAL_TABLET | Freq: Three times a day (TID) | ORAL | 0 refills | Status: DC | PRN

## 2017-06-13 NOTE — Patient Instructions (Signed)
We have given you the flu shot today.  We have given you a prescription for tramadol for the pain to use up to 3 times per day.   Keep taking the ibuprofen as well as this helps with irritated tendons.
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
## 2017-06-13 NOTE — Assessment & Plan Note (Signed)
Refer to orthopedics today per preference for a different provider. Rx for tramadol for short term use for pain and continue ibuprofen for inflammation. Likely MCL sprain or tear on exam today. Using cane for ambulation.

## 2017-06-27 DIAGNOSIS — R102 Pelvic and perineal pain: Secondary | ICD-10-CM | POA: Diagnosis not present

## 2017-07-05 ENCOUNTER — Ambulatory Visit: Payer: Self-pay

## 2017-07-05 ENCOUNTER — Encounter: Payer: Self-pay | Admitting: Family Medicine

## 2017-07-05 ENCOUNTER — Ambulatory Visit (INDEPENDENT_AMBULATORY_CARE_PROVIDER_SITE_OTHER): Payer: BLUE CROSS/BLUE SHIELD | Admitting: Family Medicine

## 2017-07-05 VITALS — BP 128/82 | HR 84 | Ht 62.0 in | Wt 174.0 lb

## 2017-07-05 DIAGNOSIS — M25362 Other instability, left knee: Secondary | ICD-10-CM | POA: Diagnosis not present

## 2017-07-05 DIAGNOSIS — M25562 Pain in left knee: Principal | ICD-10-CM

## 2017-07-05 DIAGNOSIS — G8929 Other chronic pain: Secondary | ICD-10-CM

## 2017-07-05 MED ORDER — DICLOFENAC SODIUM 2 % TD SOLN: 2.0000 "application " | Freq: Two times a day (BID) | TRANSDERMAL | 3 refills | Status: DC

## 2017-07-05 MED ORDER — VITAMIN D (ERGOCALCIFEROL) 1.25 MG (50000 UNIT) PO CAPS: 50000.0000 [IU] | ORAL_CAPSULE | ORAL | 0 refills | Status: DC

## 2017-07-05 NOTE — Patient Instructions (Signed)
Good to see you  Tramadol with the tylenol 3 times a day  Wear brace with activity Ice 20 minutes 2 times daily. Usually after activity and before bed. We will get MRI due to instability  Look up [place] as other options.  pennsaid pinkie amount topically 2 times daily as needed.   See me again in 3 weeks but I will call you when we get MRI
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
## 2017-07-05 NOTE — Assessment & Plan Note (Signed)
Patient has significant instability of the left knee.  Significant gapping on the medial aspect.  I do believe the patient has an MCL tear with what seen on ultrasound.  Possible meniscal injury as well.  We did brace for stability.  I do believe before patient goes through any type of surgical intervention patient should have MRI.  This could change management significantly as well as the type of surgery.  MRI ordered today.  Topical anti-inflammatories given.  Encourage patient to continue tramadol.  Will discuss after MRI.

## 2017-07-11 ENCOUNTER — Telehealth: Payer: Self-pay | Admitting: Internal Medicine

## 2017-07-11 NOTE — Telephone Encounter (Signed)
Pt would like to know if it would be okay for her to transfer her care from Dr Sharlet Salina to Dr Quay Burow. She said that she would not need to be seen again until her physical in July 2019 unless something else were to come up prior to this.  Dr Sharlet Salina, is this okay with you? Dr Quay Burow, if this okay with you?

## 2017-07-11 NOTE — Telephone Encounter (Signed)
I am not currently accepting new patients.  °

## 2017-07-12 NOTE — Telephone Encounter (Signed)
Left message letting patient know °

## 2017-07-17 ENCOUNTER — Ambulatory Visit
Admission: RE | Admit: 2017-07-17 | Discharge: 2017-07-17 | Disposition: A | Discharge status: Home / Self Care | Payer: BLUE CROSS/BLUE SHIELD | Source: Ambulatory Visit | Attending: Family Medicine | Admitting: Family Medicine

## 2017-07-17 DIAGNOSIS — M25462 Effusion, left knee: Secondary | ICD-10-CM | POA: Diagnosis not present

## 2017-07-17 DIAGNOSIS — M25362 Other instability, left knee: Secondary | ICD-10-CM

## 2017-07-19 ENCOUNTER — Ambulatory Visit: Payer: BLUE CROSS/BLUE SHIELD | Admitting: Family Medicine

## 2017-07-26 ENCOUNTER — Ambulatory Visit: Payer: BLUE CROSS/BLUE SHIELD | Admitting: Family Medicine

## 2017-08-10 DIAGNOSIS — M1711 Unilateral primary osteoarthritis, right knee: Secondary | ICD-10-CM | POA: Diagnosis not present

## 2017-08-10 DIAGNOSIS — S83232A Complex tear of medial meniscus, current injury, left knee, initial encounter: Secondary | ICD-10-CM | POA: Diagnosis not present

## 2017-08-10 DIAGNOSIS — M170 Bilateral primary osteoarthritis of knee: Secondary | ICD-10-CM | POA: Diagnosis not present

## 2017-09-15 DIAGNOSIS — Z9889 Other specified postprocedural states: Secondary | ICD-10-CM

## 2017-09-15 HISTORY — DX: Other specified postprocedural states: Z98.890

## 2017-09-26 DIAGNOSIS — S83232A Complex tear of medial meniscus, current injury, left knee, initial encounter: Secondary | ICD-10-CM | POA: Diagnosis not present

## 2017-09-26 DIAGNOSIS — G8918 Other acute postprocedural pain: Secondary | ICD-10-CM | POA: Diagnosis not present

## 2017-09-26 DIAGNOSIS — M94262 Chondromalacia, left knee: Secondary | ICD-10-CM | POA: Diagnosis not present

## 2017-09-26 DIAGNOSIS — S83242A Other tear of medial meniscus, current injury, left knee, initial encounter: Secondary | ICD-10-CM | POA: Diagnosis not present

## 2017-10-20 DIAGNOSIS — Z9889 Other specified postprocedural states: Secondary | ICD-10-CM | POA: Insufficient documentation

## 2017-10-30 IMAGING — CR DG CHEST 2V
2 series · 2 of 2 positions shown · non-contrast
Comparison: September 07, 2011

CLINICAL DATA: Fever and cough with chest pain for 1 week

EXAM:
CHEST  2 VIEW

[w chest pa]
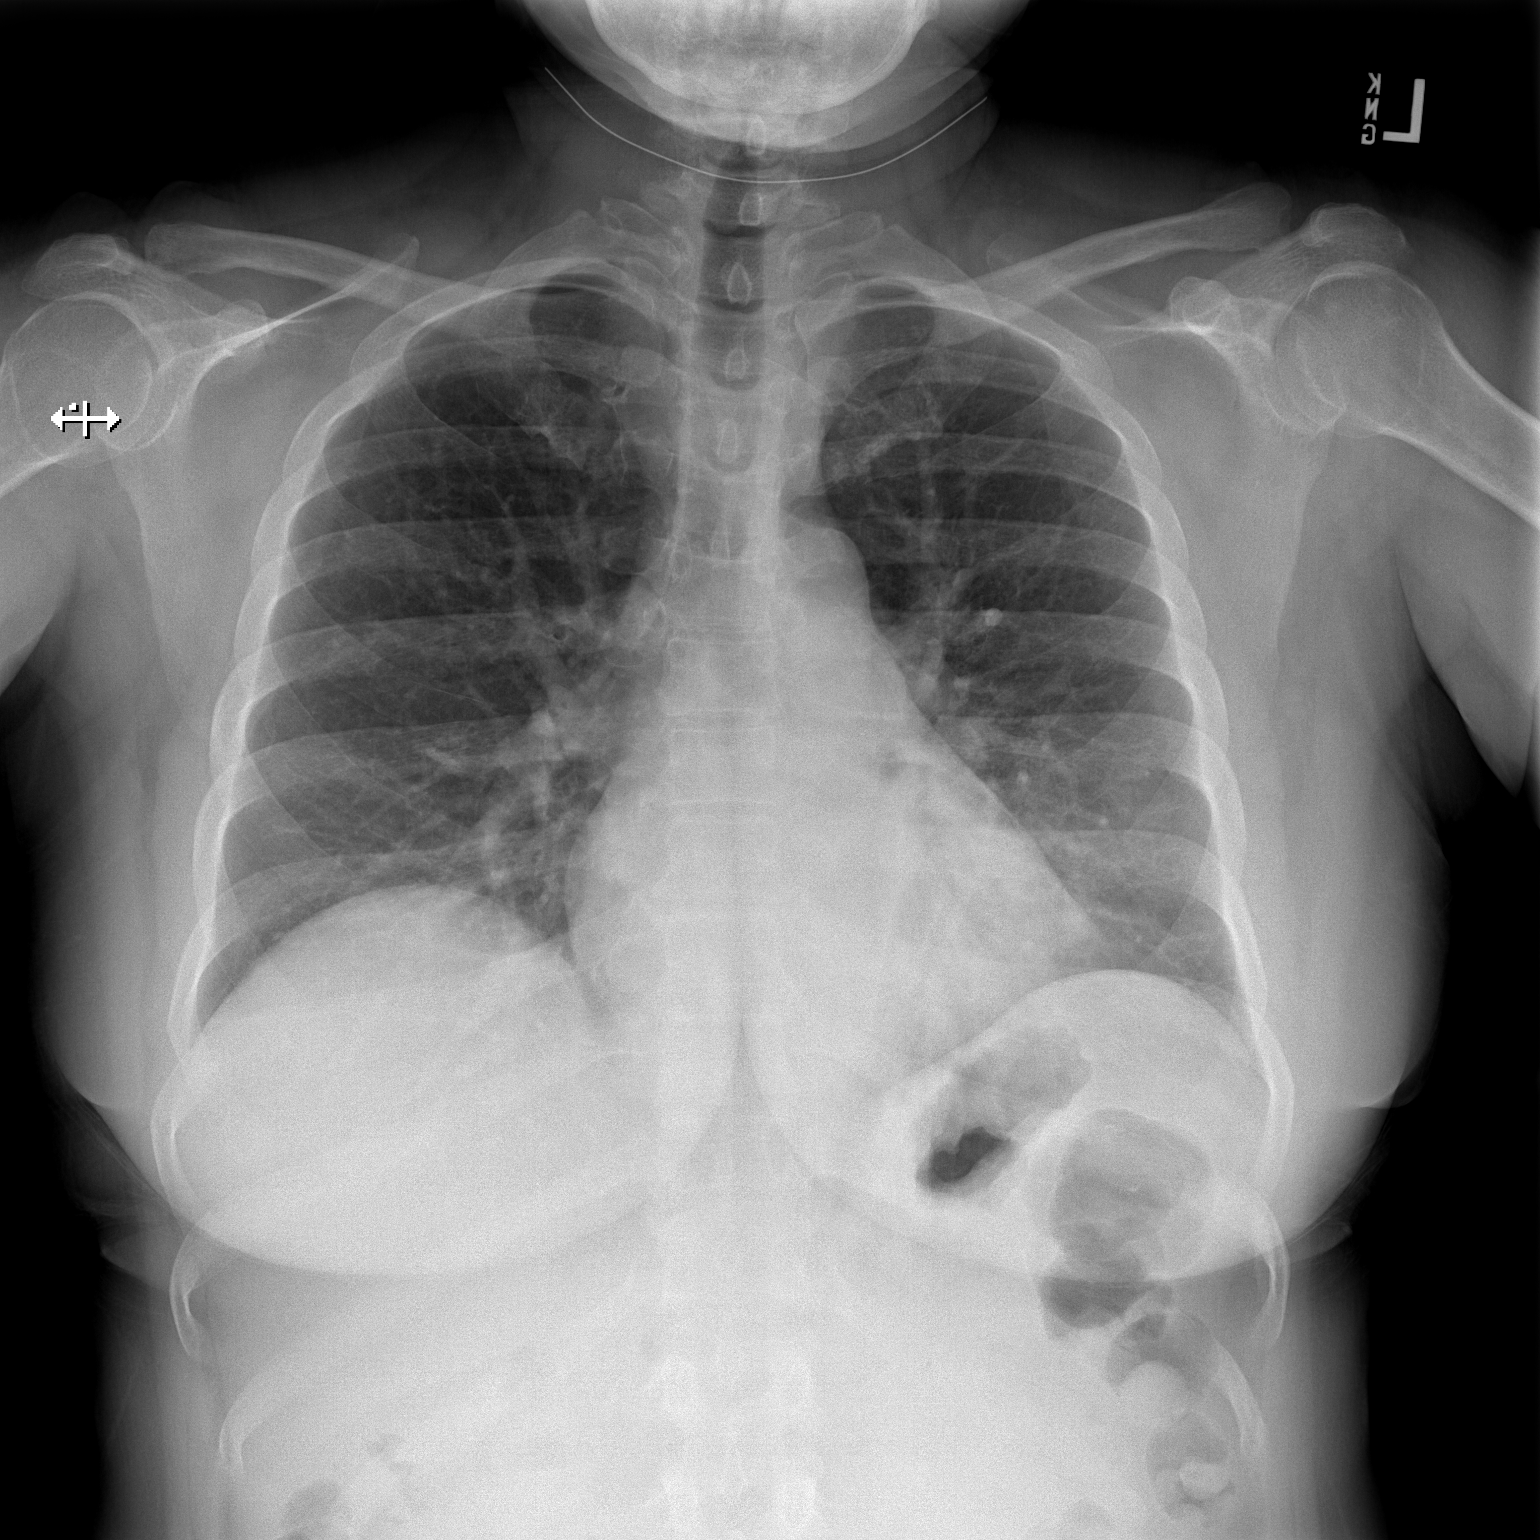

[w chest lat]
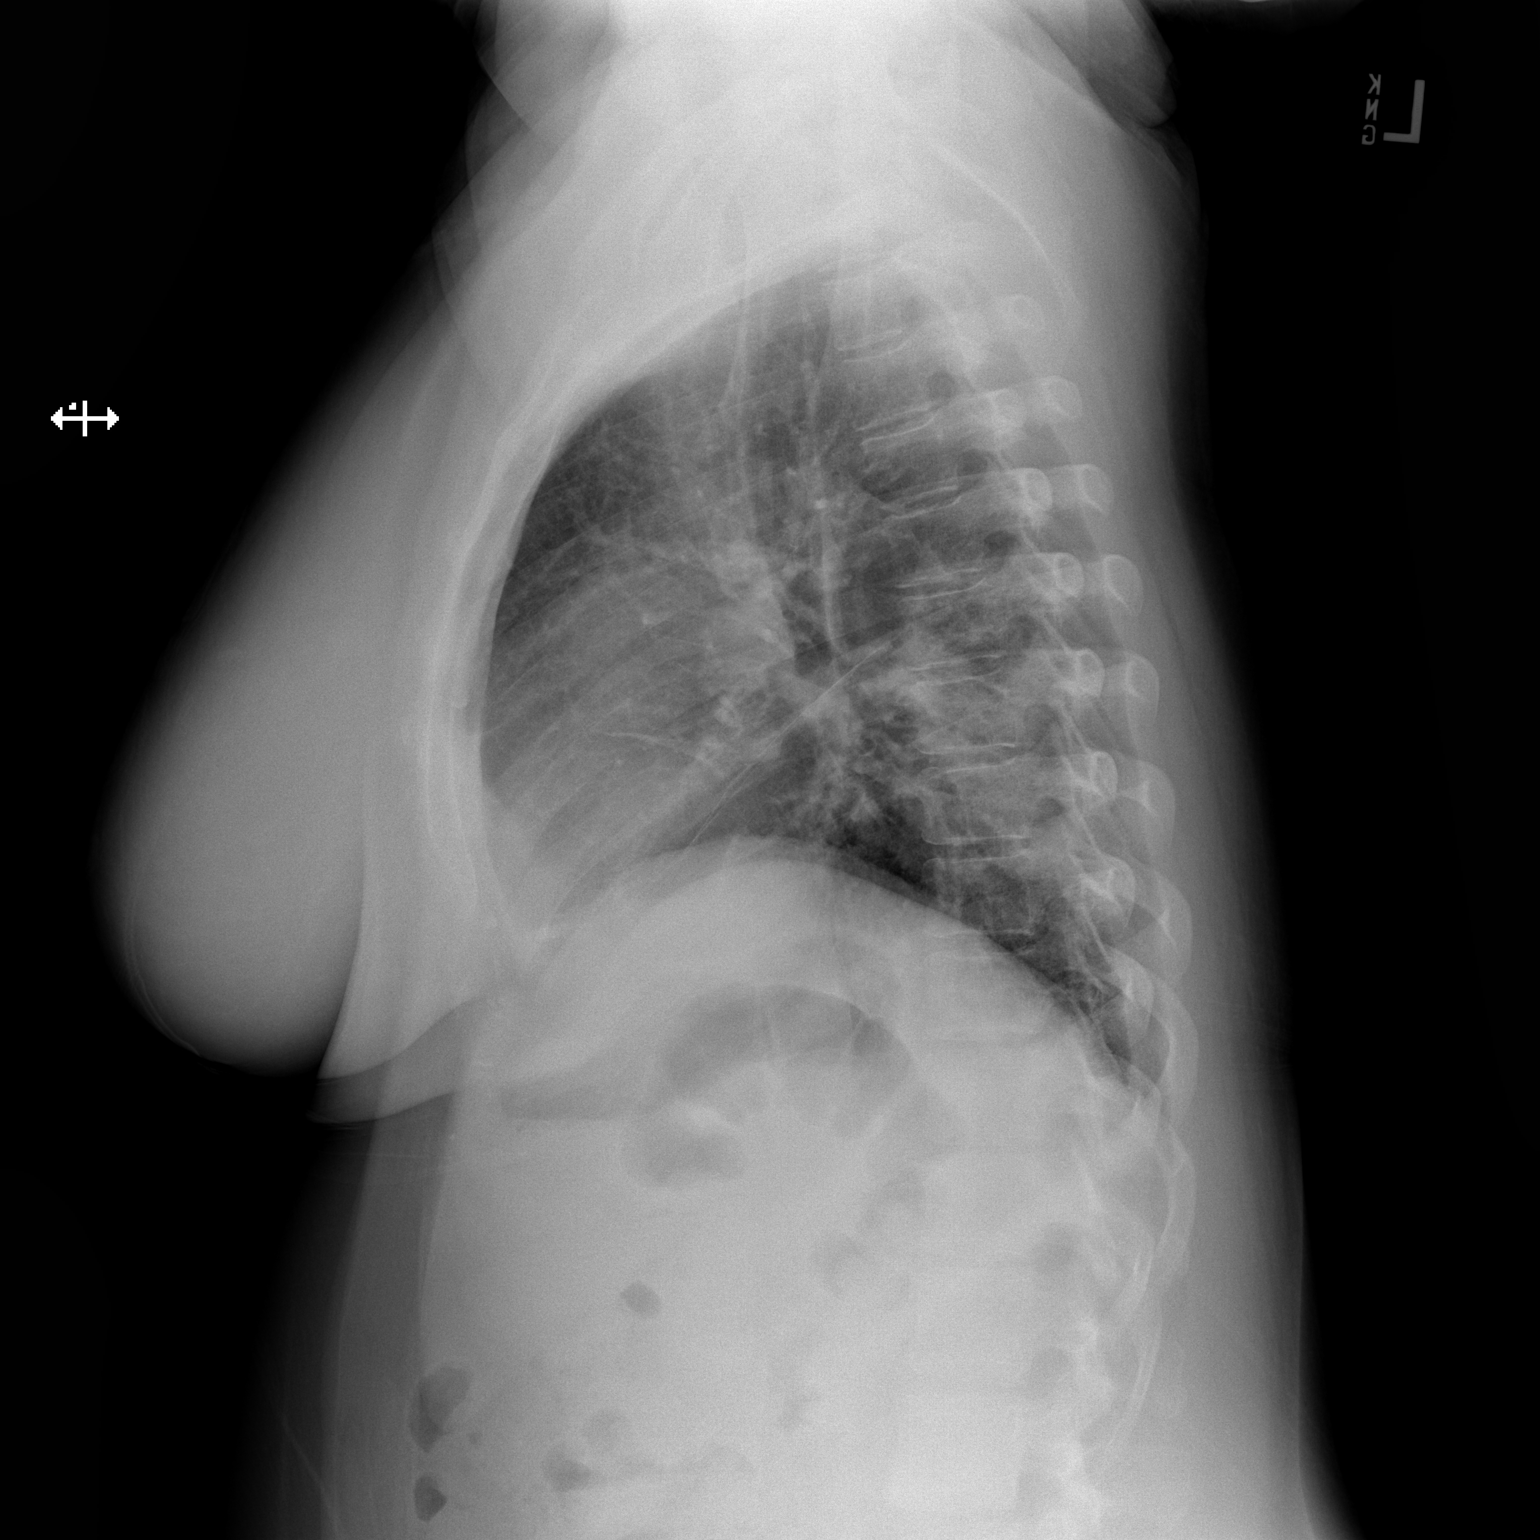

[2 of 2 positions shown; findings below may reference images not displayed]

FINDINGS: There is airspace opacity in the left lower lobe consistent with
pneumonia. Lungs elsewhere are clear. Heart size and pulmonary
vascularity are normal. No adenopathy. No pneumothorax. No bone
lesions.
IMPRESSION: Left lower lobe infiltrate consistent with pneumonia. Lungs
elsewhere clear. Cardiac silhouette within normal limits.

Followup PA and lateral chest radiographs recommended in 3-4 weeks
following trial of antibiotic therapy to ensure resolution and
exclude underlying malignancy.

## 2017-11-01 ENCOUNTER — Telehealth: Payer: Self-pay | Admitting: Internal Medicine

## 2017-11-01 NOTE — Telephone Encounter (Signed)
Okay 

## 2017-11-01 NOTE — Telephone Encounter (Signed)
Dr. Sharlet Salina, is this okay with you? Dr. Juleen China, is this okay with you?

## 2017-11-01 NOTE — Telephone Encounter (Signed)
Fine with me

## 2017-11-01 NOTE — Telephone Encounter (Signed)
Copied from [place] [id number]. Topic: General - Other >> Nov 01, 2017 11:31 AM Cecelia Byars, NT wrote: Reason for CRM: Patient would like to released from  the care of Dr Sharlet Salina , and would  like for Dr Juleen China to be her PCP , please call  her at [phone] once this done ,thanks

## 2017-11-02 ENCOUNTER — Ambulatory Visit: Payer: Self-pay | Admitting: Family Medicine

## 2017-11-02 NOTE — Telephone Encounter (Signed)
I left the patient a voicemail to notify her that the transfer was approved. I advised that I saw she had another PCP appointment scheduled today to establish care outside of [facility] in her chart but to give [place] a call if she would like to stay scheduled at the office she chose or transfer to [facility]. Awaiting patient phone call to clarify.

## 2017-11-03 DIAGNOSIS — M25562 Pain in left knee: Secondary | ICD-10-CM | POA: Diagnosis not present

## 2017-11-07 ENCOUNTER — Other Ambulatory Visit (INDEPENDENT_AMBULATORY_CARE_PROVIDER_SITE_OTHER): Payer: BLUE CROSS/BLUE SHIELD

## 2017-11-07 ENCOUNTER — Ambulatory Visit: Payer: BLUE CROSS/BLUE SHIELD | Admitting: Internal Medicine

## 2017-11-07 ENCOUNTER — Encounter: Payer: Self-pay | Admitting: Internal Medicine

## 2017-11-07 VITALS — BP 130/88 | HR 60 | Temp 97.9°F | Ht 62.0 in | Wt 176.0 lb

## 2017-11-07 DIAGNOSIS — E119 Type 2 diabetes mellitus without complications: Secondary | ICD-10-CM

## 2017-11-07 LAB — HEMOGLOBIN A1C: Hgb A1c MFr Bld: 6.5 % (ref 4.6–6.5)

## 2017-11-07 NOTE — Patient Instructions (Signed)
We will check the labs today and call you back about the result.

## 2017-11-07 NOTE — Assessment & Plan Note (Addendum)
Foot exam done, checking HgA1c and urine microalbumin and adjust as needed. Currently diet controlled. She declines need for nutrition referral. Not complicated.

## 2017-11-07 NOTE — Progress Notes (Signed)
   Subjective:    Patient ID: Grace Lyons, female    DOB: 28-May-1961, 57 y.o.   MRN: [id number]  HPI The patient is a 57 YO female coming in for follow up of her diabetes. Just went to eye specialist and they found a spot on her eye which could be related to diabetes. Recent knee injection with cortisone. Denies excessive thirst or urination. Denies numbness or burning in her feet.   Review of Systems  Constitutional: Negative.   HENT: Negative.   Eyes: Negative.   Respiratory: Negative for cough, chest tightness and shortness of breath.   Cardiovascular: Negative for chest pain, palpitations and leg swelling.  Gastrointestinal: Negative for abdominal distention, abdominal pain, constipation, diarrhea, nausea and vomiting.  Musculoskeletal: Negative.   Skin: Negative.   Neurological: Negative.   Psychiatric/Behavioral: Negative.       Objective:   Physical Exam  Constitutional: She is oriented to person, place, and time. She appears well-developed and well-nourished.  HENT:  Head: Normocephalic and atraumatic.  Eyes: EOM are normal.  Neck: Normal range of motion.  Cardiovascular: Normal rate and regular rhythm.  Pulmonary/Chest: Effort normal and breath sounds normal. No respiratory distress. She has no wheezes. She has no rales.  Abdominal: Soft. Bowel sounds are normal. She exhibits no distension. There is no tenderness. There is no rebound.  Musculoskeletal: She exhibits no edema.  Neurological: She is alert and oriented to person, place, and time. Coordination normal.  Skin: Skin is warm and dry.  Psychiatric: She has a normal mood and affect.   Vitals:   11/07/17 1002  BP: 130/88  Pulse: 60  Temp: 97.9 F (36.6 C)  TempSrc: Oral  SpO2: 98%  Weight: 176 lb (79.8 kg)  Height: 5\' 2"  (1.575 m)      Assessment & Plan:

## 2017-11-16 DIAGNOSIS — M25562 Pain in left knee: Secondary | ICD-10-CM | POA: Diagnosis not present

## 2017-11-21 DIAGNOSIS — M25562 Pain in left knee: Secondary | ICD-10-CM | POA: Diagnosis not present

## 2017-11-24 DIAGNOSIS — M25562 Pain in left knee: Secondary | ICD-10-CM | POA: Diagnosis not present

## 2017-11-27 DIAGNOSIS — M25562 Pain in left knee: Secondary | ICD-10-CM | POA: Diagnosis not present

## 2017-11-30 DIAGNOSIS — M25562 Pain in left knee: Secondary | ICD-10-CM | POA: Diagnosis not present

## 2017-12-04 DIAGNOSIS — M25562 Pain in left knee: Secondary | ICD-10-CM | POA: Diagnosis not present

## 2017-12-05 DIAGNOSIS — M25562 Pain in left knee: Secondary | ICD-10-CM | POA: Diagnosis not present

## 2017-12-12 DIAGNOSIS — M25562 Pain in left knee: Secondary | ICD-10-CM | POA: Diagnosis not present

## 2017-12-15 ENCOUNTER — Ambulatory Visit: Payer: BLUE CROSS/BLUE SHIELD | Admitting: Internal Medicine

## 2017-12-16 ENCOUNTER — Ambulatory Visit: Payer: BLUE CROSS/BLUE SHIELD | Admitting: Family Medicine

## 2017-12-16 ENCOUNTER — Encounter: Payer: Self-pay | Admitting: Family Medicine

## 2017-12-16 VITALS — BP 120/78 | HR 71 | Temp 97.7°F | Resp 16 | Ht 62.0 in | Wt 180.0 lb

## 2017-12-16 DIAGNOSIS — H60502 Unspecified acute noninfective otitis externa, left ear: Secondary | ICD-10-CM

## 2017-12-16 MED ORDER — CIPROFLOXACIN-DEXAMETHASONE 0.3-0.1 % OT SUSP: 4.0000 [drp] | Freq: Two times a day (BID) | OTIC | 0 refills | Status: DC

## 2017-12-16 NOTE — Progress Notes (Signed)
(S) 57 y.o. female complains of pain in left ear for 3 days. No fever or URI symptoms. Started the morning after she washed her own hair- typically has this done at a hair salon.  Thinks she got water in her ear. No URI symptoms. No discharge. Current Outpatient Medications on File Prior to Visit  Medication Sig Dispense Refill  . Cholecalciferol (VITAMIN D3) 1000 UNITS CAPS Take 1,000 Units by mouth daily.     Marland Kitchen glucosamine-chondroitin 500-400 MG tablet Take 1 tablet by mouth at bedtime.     Marland Kitchen ibuprofen (ADVIL,MOTRIN) 600 MG tablet Take 1 tablet (600 mg total) by mouth every 6 (six) hours as needed. 30 tablet 0  . Omega-3 Fatty Acids (OMEGA 3 PO) Take 1 capsule by mouth daily.     Marland Kitchen VITAMIN A PO Take 1 tablet by mouth daily.      No current facility-administered medications on file prior to visit.     No Known Allergies  Past Medical History:  Diagnosis Date  . Arthritis   . Cervical polyp   . Fibroid   . H/O left knee surgery 09/2017  . Menometrorrhagia   . OBESITY   . VITAMIN D DEFICIENCY     Past Surgical History:  Procedure Laterality Date  . COMBINED HYSTEROSCOPY DIAGNOSTIC / D&C    . ENDOMETRIAL BIOPSY  12/17/2008  . HYSTEROSCOPY W/D&C  08/31/2012   Procedure: DILATATION AND CURETTAGE /HYSTEROSCOPY;  Surgeon: Delice Lesch, MD;  Location: [facility];  Service: Gynecology;  Laterality: N/A;  with resectoscope  . TUBAL LIGATION      Family History  Problem Relation Age of Onset  . Diabetes Mother   . Hypertension Mother   . Diabetes Father   . Hypertension Father     Social History   Socioeconomic History  . Marital status: Single    Spouse name: Not on file  . Number of children: Not on file  . Years of education: Not on file  . Highest education level: Not on file  Occupational History  . Not on file  Social Needs  . Financial resource strain: Not on file  . Food insecurity:    Worry: Not on file    Inability: Not on file  . Transportation needs:   Medical: Not on file    Non-medical: Not on file  Tobacco Use  . Smoking status: Former Smoker    Last attempt to quit: 01/12/1996    Years since quitting: 21.9  . Smokeless tobacco: Never Used  . Tobacco comment: Single, lives alone. 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 Alcohol use: No  . Drug use: No  . Sexual activity: Not Currently    Birth control/protection: Surgical    Comment: BTL  Lifestyle  . Physical activity:    Days per week: Not on file    Minutes per session: Not on file  . Stress: Not on file  Relationships  . Social connections:    Talks on phone: Not on file    Gets together: Not on file    Attends religious service: Not on file    Active member of club or organization: Not on file    Attends meetings of clubs or organizations: Not on file    Relationship status: Not on file  . Intimate partner violence:    Fear of current or ex partner: Not on file  Emotionally abused: Not on file    Physically abused: Not on file    Forced sexual activity: Not on file  Other Topics Concern  . Not on file  Social History Narrative  . Not on file   The PMH, PSH, Social History, Family History, Medications, and allergies have been reviewed in [facility], and have been updated if relevant.  (O) She appears well, afebrile. Right ear reveals tenderness of the tragus;  and inflammation in external canal. TM normal.  (A) Otitis Externa  (P) Instructed to keep ear dry until better; eardrops per orders, call if persistent pain, swelling or fever, FUV prn.

## 2017-12-16 NOTE — Patient Instructions (Signed)
Otitis Externa Otitis externa is an infection of the outer ear canal. The outer ear canal is the area between the outside of the ear and the eardrum. Otitis externa is sometimes called "swimmer's ear." Follow these instructions at home:  If you were given antibiotic ear drops, use them as told by your doctor. Do not stop using them even if your condition gets better.  Take over-the-counter and prescription medicines only as told by your doctor.  Keep all follow-up visits as told by your doctor. This is important. How is this prevented?  Keep your ear dry. Use the corner of a towel to dry your ear after you swim or bathe.  Try not to scratch or put things in your ear. Doing these things makes it easier for germs to grow in your ear.  Avoid swimming in lakes, dirty water, or pools that may not have the right amount of a chemical called chlorine.  Consider making ear drops and putting 3 or 4 drops in each ear after you swim. Ask your doctor about how you can make ear drops. Contact a doctor if:  You have a fever.  After 3 days your ear is still red, swollen, or painful.  After 3 days you still have pus coming from your ear.  Your redness, swelling, or pain gets worse.  You have a really bad headache.  You have redness, swelling, pain, or tenderness behind your ear. This information is not intended to replace advice given to you by your health care provider. Make sure you discuss any questions you have with your health care provider. Document Released: 01/18/2008 Document Revised: 08/27/2015 Document Reviewed: 05/11/2015 Elsevier Interactive Patient Education  2018 Elsevier Inc.  

## 2017-12-18 ENCOUNTER — Other Ambulatory Visit: Payer: Self-pay | Admitting: Internal Medicine

## 2017-12-18 DIAGNOSIS — Z1231 Encounter for screening mammogram for malignant neoplasm of breast: Secondary | ICD-10-CM

## 2017-12-21 DIAGNOSIS — L237 Allergic contact dermatitis due to plants, except food: Secondary | ICD-10-CM | POA: Diagnosis not present

## 2018-01-08 ENCOUNTER — Ambulatory Visit (HOSPITAL_COMMUNITY)
Admission: EM | Admit: 2018-01-08 | Discharge: 2018-01-08 | Disposition: A | Discharge status: Home / Self Care | Payer: BLUE CROSS/BLUE SHIELD | Attending: Family Medicine | Admitting: Family Medicine

## 2018-01-08 ENCOUNTER — Telehealth (HOSPITAL_COMMUNITY): Payer: Self-pay

## 2018-01-08 ENCOUNTER — Encounter (HOSPITAL_COMMUNITY): Payer: Self-pay | Admitting: Family Medicine

## 2018-01-08 DIAGNOSIS — R21 Rash and other nonspecific skin eruption: Secondary | ICD-10-CM | POA: Diagnosis not present

## 2018-01-08 MED ORDER — HYDROCORTISONE 1 % EX OINT: 1.0000 "application " | TOPICAL_OINTMENT | Freq: Two times a day (BID) | CUTANEOUS | 0 refills | Status: DC

## 2018-01-08 MED ORDER — BACITRACIN ZINC 500 UNIT/GM EX OINT
TOPICAL_OINTMENT | CUTANEOUS | Status: AC
  Filled 2018-01-08: qty 0.9

## 2018-01-08 MED ORDER — PREDNISONE 10 MG (21) PO TBPK: ORAL_TABLET | Freq: Every day | ORAL | 0 refills | Status: DC

## 2018-01-08 MED ORDER — HYDROCORTISONE 2.5 % EX LOTN: TOPICAL_LOTION | Freq: Two times a day (BID) | CUTANEOUS | 0 refills | Status: DC

## 2018-01-08 NOTE — Discharge Instructions (Signed)
Please return back to your original soap  Please restart prednisone taper, beginning with 6 tablets on day 1 decrease by 1 tablet each day until you take 1 tablet on day 6.  Please use hydrocortisone ointment topically on the areas causing significant itching, please apply thin amount.  For itching you may continue to take hydroxyzine or you may try daily Zyrtec and use Benadryl at nighttime.

## 2018-01-08 NOTE — Telephone Encounter (Signed)
Pt called and stated she wanted the 2.5% hydrocortisone cream because she has used the 1% before with no relief. Per [facility] PA this is okay.  Rx sent to pharmacy of choice.

## 2018-01-08 NOTE — ED Provider Notes (Signed)
[facility]    CSN: [id number] Arrival date & time: 01/08/18  7673     History   Chief Complaint Chief Complaint  Patient presents with  . Rash    HPI Grace Lyons is a 57 y.o. female no concerning past medical history presenting today for evaluation of a rash.  Patient has had a rash pop up over the past couple days.  She recently had a similar rash 3 and half weeks ago after exposure to poison ivy.  She has been on a prednisone taper which she did not finish.  She has not taken any steroids for the past week and a half.  Rashes associated with significant itching.  Does not believe she has had poison ivy again, but has been on the property where she initially touched it.  Also notes that she recently changed her soaps 2 weeks ago.  Rash located on arms, neck and trunk.  No involvement of lower extremities.  HPI  Past Medical History:  Diagnosis Date  . Arthritis   . Cervical polyp   . Fibroid   . H/O left knee surgery 09/2017  . Menometrorrhagia   . OBESITY   . VITAMIN D DEFICIENCY     Patient Active Problem List   Diagnosis Date Noted  . Instability of left knee joint 07/05/2017  . Degenerative arthritis of knee, bilateral 12/09/2015  . Routine general medical examination at a health care facility 01/27/2015  . Other bilateral secondary osteoarthritis of knee 11/26/2014  . Pes planus of both feet 06/20/2014  . Loss of transverse plantar arch 06/20/2014  . Knee pain, left 12/29/2010  . Diabetes mellitus type 2, controlled, without complications ([place]) 41/93/7902  . Vitamin D deficiency 12/23/2009  . OBESITY 12/23/2009    Past Surgical History:  Procedure Laterality Date  . COMBINED HYSTEROSCOPY DIAGNOSTIC / D&C    . ENDOMETRIAL BIOPSY  12/17/2008  . HYSTEROSCOPY W/D&C  08/31/2012   Procedure: DILATATION AND CURETTAGE /HYSTEROSCOPY;  Surgeon: Delice Lesch, MD;  Location: [facility];  Service: Gynecology;  Laterality: N/A;  with resectoscope  . TUBAL  LIGATION      OB History    Gravida  1   Para      Term      Preterm      AB      Living  1     SAB      TAB      Ectopic      Multiple      Live Births               Home Medications    Prior to Admission medications   Medication Sig Start Date End Date Taking? Authorizing Provider  Cholecalciferol (VITAMIN D3) 1000 UNITS CAPS Take 1,000 Units by mouth daily.     [provider]  glucosamine-chondroitin 500-400 MG tablet Take 1 tablet by mouth at bedtime.     [provider]  hydrocortisone 1 % ointment Apply 1 application topically 2 (two) times daily. 01/08/18   Dynastie Knoop C, PA-C  ibuprofen (ADVIL,MOTRIN) 600 MG tablet Take 1 tablet (600 mg total) by mouth every 6 (six) hours as needed. 04/21/17   Varney Biles, MD  Omega-3 Fatty Acids (OMEGA 3 PO) Take 1 capsule by mouth daily.     [provider]  predniSONE (STERAPRED UNI-PAK 21 TAB) 10 MG (21) TBPK tablet Take by mouth daily. Begin with 6 tablets day 1, 5 tabs day 2,  4 tabs day 3, 3 tabs day 4, 2 tabs day 5, 1 tab day 6 01/08/18   Alliana Mcauliff C, PA-C  VITAMIN A PO Take 1 tablet by mouth daily.     [provider]    Family History Family History  Problem Relation Age of Onset  . Diabetes Mother   . Hypertension Mother   . Diabetes Father   . Hypertension Father     Social History Social History   Tobacco Use  . Smoking status: Former Smoker    Last attempt to quit: 01/12/1996    Years since quitting: 22.0  . Smokeless tobacco: Never Used  . Tobacco comment: Single, lives alone. 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 Alcohol use: No  . Drug use: No     Allergies   Patient has no known allergies.   Review of Systems Review of Systems  Constitutional: Negative for fatigue and fever.  HENT: Negative for mouth sores.   Eyes: Negative for visual disturbance.  Respiratory:  Negative for shortness of breath.   Cardiovascular: Negative for chest pain.  Gastrointestinal: Negative for abdominal pain, nausea and vomiting.  Genitourinary: Negative for genital sores.  Musculoskeletal: Negative for arthralgias and joint swelling.  Skin: Positive for color change and rash. Negative for wound.  Neurological: Negative for dizziness, weakness, light-headedness and headaches.     Physical Exam Triage Vital Signs ED Triage Vitals  Enc Vitals Group     BP      Pulse      Resp      Temp      Temp src      SpO2      Weight      Height      Head Circumference      Peak Flow      Pain Score      Pain Loc      Pain Edu?      Excl. in [place]?    No data found.  Updated Vital Signs BP 123/69   Pulse 73   Temp 98.1 F (36.7 C)   Resp 18   LMP 08/14/2012   SpO2 100%   Visual Acuity Right Eye Distance:   Left Eye Distance:   Bilateral Distance:    Right Eye Near:   Left Eye Near:    Bilateral Near:     Physical Exam  Constitutional: She appears well-developed and well-nourished. No distress.  HENT:  Head: Normocephalic and atraumatic.  Eyes: Conjunctivae are normal.  Neck: Neck supple.  Cardiovascular: Normal rate and regular rhythm.  No murmur heard. Pulmonary/Chest: Effort normal and breath sounds normal. No respiratory distress.  Abdominal: Soft. There is no tenderness.  Musculoskeletal: She exhibits no edema.  Neurological: She is alert.  Skin: Skin is warm and dry. Rash noted.  Erythematous papular rash to bilateral upper extremities, worse on left, neck and trunk.  No involvement of face or lower extremities.  No vesicular-like lesions.  Psychiatric: She has a normal mood and affect.  Nursing note and vitals reviewed.    UC Treatments / Results  Labs (all labs ordered are listed, but only abnormal results are displayed) Labs Reviewed - No data to display  EKG None  Radiology No results found.  Procedures Procedures (including  critical care time)  Medications Ordered in UC Medications - No data to display  Initial Impression / Assessment and  Plan / UC Course  I have reviewed the triage vital signs and the nursing notes.  Pertinent labs & imaging results that were available during my care of the patient were reviewed by me and considered in my medical decision making (see chart for details).     Rash does not appear typical poison of dermatitis.  Possible other contact dermatitis/allergic reaction.  Will reinitiate prednisone taper over 6 days.  Discussed continuing hydroxyzine which was provided to her previously for itching or may try Zyrtec and Benadryl.  Patient will follow-up here with her PCP if symptoms not improving.  Discussed strict return precautions. Patient verbalized understanding and is agreeable with plan.  Final Clinical Impressions(s) / UC Diagnoses   Final diagnoses:  Rash and nonspecific skin eruption     Discharge Instructions     Please return back to your original soap  Please restart prednisone taper, beginning with 6 tablets on day 1 decrease by 1 tablet each day until you take 1 tablet on day 6.  Please use hydrocortisone ointment topically on the areas causing significant itching, please apply thin amount.  For itching you may continue to take hydroxyzine or you may try daily Zyrtec and use Benadryl at nighttime.    ED Prescriptions    Medication Sig Dispense Auth. Provider   hydrocortisone 1 % ointment Apply 1 application topically 2 (two) times daily. 56 g Sylvester Salonga C, PA-C   predniSONE (STERAPRED UNI-PAK 21 TAB) 10 MG (21) TBPK tablet Take by mouth daily. Begin with 6 tablets day 1, 5 tabs day 2, 4 tabs day 3, 3 tabs day 4, 2 tabs day 5, 1 tab day 6 21 tablet Cordale Manera C, PA-C     Controlled Substance Prescriptions [place] Controlled Substance Registry consulted? Not Applicable   Janith Lima, [place] 01/08/18 1029

## 2018-01-08 NOTE — ED Triage Notes (Signed)
Pt here for hives. Reports that she was treated for poison ivy with prednisone and didn't finish the complete course. Reports rash has returned and itching.

## 2018-01-30 ENCOUNTER — Ambulatory Visit
Admission: RE | Admit: 2018-01-30 | Discharge: 2018-01-30 | Disposition: A | Discharge status: Home / Self Care | Payer: BLUE CROSS/BLUE SHIELD | Source: Ambulatory Visit | Attending: Internal Medicine | Admitting: Internal Medicine

## 2018-01-30 DIAGNOSIS — Z1231 Encounter for screening mammogram for malignant neoplasm of breast: Secondary | ICD-10-CM | POA: Diagnosis not present

## 2018-03-13 ENCOUNTER — Ambulatory Visit: Payer: BLUE CROSS/BLUE SHIELD | Admitting: Family Medicine

## 2018-03-13 ENCOUNTER — Encounter: Payer: Self-pay | Admitting: Family Medicine

## 2018-03-13 VITALS — BP 122/84 | HR 71 | Temp 98.2°F | Ht 62.0 in | Wt 177.8 lb

## 2018-03-13 DIAGNOSIS — Z0000 Encounter for general adult medical examination without abnormal findings: Secondary | ICD-10-CM

## 2018-03-13 DIAGNOSIS — Z13220 Encounter for screening for lipoid disorders: Secondary | ICD-10-CM | POA: Diagnosis not present

## 2018-03-13 DIAGNOSIS — N921 Excessive and frequent menstruation with irregular cycle: Secondary | ICD-10-CM | POA: Insufficient documentation

## 2018-03-13 DIAGNOSIS — R5383 Other fatigue: Secondary | ICD-10-CM | POA: Diagnosis not present

## 2018-03-13 DIAGNOSIS — T466X5A Adverse effect of antihyperlipidemic and antiarteriosclerotic drugs, initial encounter: Secondary | ICD-10-CM

## 2018-03-13 DIAGNOSIS — E559 Vitamin D deficiency, unspecified: Secondary | ICD-10-CM

## 2018-03-13 DIAGNOSIS — E119 Type 2 diabetes mellitus without complications: Secondary | ICD-10-CM

## 2018-03-13 DIAGNOSIS — N841 Polyp of cervix uteri: Secondary | ICD-10-CM | POA: Insufficient documentation

## 2018-03-13 DIAGNOSIS — M791 Myalgia, unspecified site: Secondary | ICD-10-CM

## 2018-03-13 LAB — COMPREHENSIVE METABOLIC PANEL
ALT: 17 U/L (ref 0–35)
AST: 14 U/L (ref 0–37)
Albumin: 4.5 g/dL (ref 3.5–5.2)
Alkaline Phosphatase: 51 U/L (ref 39–117)
BUN: 12 mg/dL (ref 6–23)
CO2: 28 mEq/L (ref 19–32)
Calcium: 10.5 mg/dL (ref 8.4–10.5)
Chloride: 106 mEq/L (ref 96–112)
Creatinine, Ser: 0.72 mg/dL (ref 0.40–1.20)
GFR: 107.17 mL/min (ref >60.00)
Glucose, Bld: 113 mg/dL — ABNORMAL HIGH (ref 70–99)
Potassium: 4 mEq/L (ref 3.5–5.1)
Sodium: 139 mEq/L (ref 135–145)
Total Bilirubin: 0.7 mg/dL (ref 0.2–1.2)
Total Protein: 7.2 g/dL (ref 6.0–8.3)

## 2018-03-13 LAB — MICROALBUMIN / CREATININE URINE RATIO
Creatinine,U: 193.2 mg/dL
Microalb Creat Ratio: 0.4 mg/g (ref 0.0–30.0)
Microalb, Ur: 0.8 mg/dL (ref 0.0–1.9)

## 2018-03-13 LAB — CBC WITH DIFFERENTIAL/PLATELET
Basophils Absolute: 0 10*3/uL (ref 0.0–0.1)
Basophils Relative: 0.6 % (ref 0.0–3.0)
Eosinophils Absolute: 0.1 10*3/uL (ref 0.0–0.7)
Eosinophils Relative: 2.2 % (ref 0.0–5.0)
HCT: 39.2 % (ref 36.0–46.0)
Hemoglobin: 13 g/dL (ref 12.0–15.0)
Lymphocytes Relative: 31.4 % (ref 12.0–46.0)
Lymphs Abs: 1.4 10*3/uL (ref 0.7–4.0)
MCHC: 33.1 g/dL (ref 30.0–36.0)
MCV: 94.4 fl (ref 78.0–100.0)
Monocytes Absolute: 0.4 10*3/uL (ref 0.1–1.0)
Monocytes Relative: 9.7 % (ref 3.0–12.0)
Neutro Abs: 2.5 10*3/uL (ref 1.4–7.7)
Neutrophils Relative %: 56.1 % (ref 43.0–77.0)
Platelets: 259 10*3/uL (ref 150.0–400.0)
RBC: 4.15 Mil/uL (ref 3.87–5.11)
RDW: 14.6 % (ref 11.5–15.5)
WBC: 4.5 10*3/uL (ref 4.0–10.5)

## 2018-03-13 LAB — TSH: TSH: 0.78 u[IU]/mL (ref 0.35–4.50)

## 2018-03-13 LAB — LIPID PANEL
Cholesterol: 150 mg/dL (ref 0–200)
HDL: 65.2 mg/dL (ref >39.00)
LDL Cholesterol: 71 mg/dL (ref 0–99)
NonHDL: 84.63
Total CHOL/HDL Ratio: 2
Triglycerides: 70 mg/dL (ref 0.0–149.0)
VLDL: 14 mg/dL (ref 0.0–40.0)

## 2018-03-13 LAB — VITAMIN D 25 HYDROXY (VIT D DEFICIENCY, FRACTURES): VITD: 36.07 ng/mL (ref 30.00–100.00)

## 2018-03-13 LAB — HEMOGLOBIN A1C: Hgb A1c MFr Bld: 6.6 % — ABNORMAL HIGH (ref 4.6–6.5)

## 2018-03-13 LAB — VITAMIN B12: Vitamin B-12: 477 pg/mL (ref 211–911)

## 2018-03-13 NOTE — Progress Notes (Signed)
Subjective:    Tresea ERIS HANNAN is a 57 y.o. female and is here for a comprehensive physical exam.  Health Maintenance Due  Topic Date Due  . PNEUMOCOCCAL POLYSACCHARIDE VACCINE (1) 08/26/1962  . URINE MICROALBUMIN  03/08/2017    Current Outpatient Medications:  .  Cholecalciferol (VITAMIN D3) 1000 UNITS CAPS, Take 1,000 Units by mouth daily. , Disp: , Rfl:  .  hydrocortisone 2.5 % lotion, Apply topically 2 (two) times daily., Disp: 59 mL, Rfl: 0 .  Omega-3 Fatty Acids (OMEGA 3 PO), Take 1 capsule by mouth daily. , Disp: , Rfl:   PMHx, SurgHx, SocialHx, Medications, and Allergies were reviewed in the Visit Navigator and updated as appropriate.   Past Medical History:  Diagnosis Date  . Arthritis   . Cervical polyp   . Fibroid   . H/O left knee surgery 09/2017  . Menometrorrhagia   . Vitamin D deficiency      Past Surgical History:  Procedure Laterality Date  . COMBINED HYSTEROSCOPY DIAGNOSTIC / D&C    . ENDOMETRIAL BIOPSY  12/17/2008  . HYSTEROSCOPY W/D&C  08/31/2012   Procedure: DILATATION AND CURETTAGE /HYSTEROSCOPY;  Surgeon: Delice Lesch, MD;  Location: [facility];  Service: Gynecology;  Laterality: N/A;  with resectoscope  . TUBAL LIGATION       Family History  Problem Relation Age of Onset  . Diabetes Mother   . Hypertension Mother   . Diabetes Father   . Hypertension Father     Social History   Tobacco Use  . Smoking status: Former Smoker    Last attempt to quit: 01/12/1996    Years since quitting: 22.1  . Smokeless tobacco: Never Used  . Tobacco comment: Single, lives alone. 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 Alcohol use: No  . Drug use: No    Review of Systems:   Pertinent items are noted in the HPI. Otherwise, ROS is negative.  Objective:   BP 122/84   Pulse 71   Temp 98.2 F (36.8 C) (Oral)   Ht 5\' 2"  (1.575 m)   Wt 177 lb 12.8 oz (80.6 kg)   LMP 08/14/2012   SpO2  96%   BMI 32.52 kg/m   General appearance: alert, cooperative and appears stated age. Head: normocephalic, without obvious abnormality, atraumatic. Neck: no adenopathy, supple, symmetrical, trachea midline; thyroid not enlarged, symmetric, no tenderness/mass/nodules. Lungs: clear to auscultation bilaterally. Heart: regular rate and rhythm Abdomen: soft, non-tender; no masses,  no organomegaly. Extremities: extremities normal, atraumatic, no cyanosis or edema. Skin: skin color, texture, turgor normal, no rashes or lesions. Lymph: cervical, supraclavicular, and axillary nodes normal; no abnormal inguinal nodes palpated. Neurologic: grossly normal.                                      Assessment/Plan:   Olga was seen today for establish care.  Diagnoses and all orders for this visit:  Routine physical examination  Vitamin D deficiency -     VITAMIN D 25 Hydroxy (Vit-D Deficiency, Fractures)  Type 2 diabetes mellitus without complication, without long-term current use of insulin (HCC) -     CBC with Differential/Platelet -     Comprehensive metabolic panel -     Hemoglobin A1c -     Microalbumin / creatinine urine ratio  Screening for  lipid disorders -     Lipid panel  Morbid obesity (HCC)  Fatigue, unspecified type -     TSH -     B12  Refuses statin    Patient Counseling: [x]    Nutrition: Stressed importance of moderation in sodium/caffeine intake, saturated fat and cholesterol, caloric balance, sufficient intake of fresh fruits, vegetables, fiber, calcium, iron, and 1 mg of folate supplement per day (for females capable of pregnancy).  [x]    Stressed the importance of regular exercise.   [x]    Substance Abuse: Discussed cessation/primary prevention of tobacco, alcohol, or other drug use; driving or other dangerous activities under the influence; availability of treatment for abuse.   [x]    Injury prevention: Discussed safety belts, safety helmets, smoke detector,  smoking near bedding or upholstery.   [x]    Sexuality: Discussed sexually transmitted diseases, partner selection, use of condoms, avoidance of unintended pregnancy  and contraceptive alternatives.  [x]    Dental health: Discussed importance of regular tooth brushing, flossing, and dental visits.  [x]    Health maintenance and immunizations reviewed. Please refer to Health maintenance section.   Briscoe Deutscher, DO [place]

## 2018-03-23 DIAGNOSIS — Z01419 Encounter for gynecological examination (general) (routine) without abnormal findings: Secondary | ICD-10-CM | POA: Diagnosis not present

## 2018-03-23 DIAGNOSIS — Z124 Encounter for screening for malignant neoplasm of cervix: Secondary | ICD-10-CM | POA: Diagnosis not present

## 2018-03-23 DIAGNOSIS — R87610 Atypical squamous cells of undetermined significance on cytologic smear of cervix (ASC-US): Secondary | ICD-10-CM | POA: Diagnosis not present

## 2018-03-23 DIAGNOSIS — Z6832 Body mass index (BMI) 32.0-32.9, adult: Secondary | ICD-10-CM | POA: Diagnosis not present

## 2018-03-26 ENCOUNTER — Encounter: Payer: Self-pay | Admitting: Family Medicine

## 2018-03-26 ENCOUNTER — Other Ambulatory Visit: Payer: BLUE CROSS/BLUE SHIELD

## 2018-03-26 ENCOUNTER — Ambulatory Visit: Payer: Self-pay

## 2018-03-26 ENCOUNTER — Ambulatory Visit: Payer: BLUE CROSS/BLUE SHIELD | Admitting: Family Medicine

## 2018-03-26 VITALS — BP 140/84 | HR 84 | Ht 62.0 in | Wt 184.0 lb

## 2018-03-26 DIAGNOSIS — G8929 Other chronic pain: Secondary | ICD-10-CM | POA: Diagnosis not present

## 2018-03-26 DIAGNOSIS — M25561 Pain in right knee: Principal | ICD-10-CM

## 2018-03-26 DIAGNOSIS — M25461 Effusion, right knee: Secondary | ICD-10-CM

## 2018-03-26 MED ORDER — PREDNISONE 50 MG PO TABS: 50.0000 mg | ORAL_TABLET | Freq: Every day | ORAL | 0 refills | Status: DC

## 2018-03-26 NOTE — Progress Notes (Signed)
[name] [address], [place] [address] Phone: [phone] Subjective:     CC: Right knee pain  ONG:[id number]  Grace Lyons is a 57 y.o. female coming in with complaint of right knee pain. States her knee swelled up this past weekend after rubbing her knee. Knee is stiff. Limited ROM. No numbness and tingling noted. Pain radiates down the leg.  Onset- Saturday Location- lateral knee/ proximal Aggravating factors- Flexion Therapies tried- Oral, topical, knee brace Severity-7 out of 10   Patient's left knee continues to have a arthroscopic procedure done last year and had been doing relatively well.  Past Medical History:  Diagnosis Date  . Arthritis   . Cervical polyp   . Fibroid   . H/O left knee surgery 09/2017  . Menometrorrhagia   . Vitamin D deficiency    Past Surgical History:  Procedure Laterality Date  . COMBINED HYSTEROSCOPY DIAGNOSTIC / D&C    . ENDOMETRIAL BIOPSY  12/17/2008  . HYSTEROSCOPY W/D&C  08/31/2012   Procedure: DILATATION AND CURETTAGE /HYSTEROSCOPY;  Surgeon: Delice Lesch, MD;  Location: [facility];  Service: Gynecology;  Laterality: N/A;  with resectoscope  . TUBAL LIGATION     Social History   Socioeconomic History  . Marital status: Single    Spouse name: Not on file  . Number of children: Not on file  . Years of education: Not on file  . Highest education level: Not on file  Occupational History    Employer: [employer]  Social Needs  . Financial resource strain: Not on file  . Food insecurity:    Worry: Not on file    Inability: Not on file  . Transportation needs:    Medical: Not on file    Non-medical: Not on file  Tobacco Use  . Smoking status: Former Smoker    Last attempt to quit: 01/12/1996    Years since quitting: 22.2  . Smokeless tobacco: Never Used  . Tobacco comment: Single, lives alone. 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 Alcohol use: No  . Drug use: No  . Sexual activity: Not Currently    Birth control/protection: Surgical    Comment: BTL  Lifestyle  . Physical activity:    Days per week: Not on file    Minutes per session: Not on file  . Stress: Not on file  Relationships  . Social connections:    Talks on phone: Not on file    Gets together: Not on file    Attends religious service: Not on file    Active member of club or organization: Not on file    Attends meetings of clubs or organizations: Not on file    Relationship status: Not on file  Other Topics Concern  . Not on file  Social History Narrative  . Not on file   No Known Allergies Family History  Problem Relation Age of Onset  . Diabetes Mother   . Hypertension Mother   . Diabetes Father   . Hypertension Father      Past medical history, social, surgical and family history all reviewed in electronic medical record.  No pertanent information unless stated regarding to the chief complaint.   Review of Systems:Review of systems updated and as accurate as of 03/26/18  No headache, visual changes, nausea, vomiting, diarrhea, constipation, dizziness, abdominal pain,  skin rash, fevers, chills, night sweats, weight loss, swollen lymph nodes, body aches,  chest pain, shortness of breath, mood changes.  Positive muscle aches, joint swelling  Objective  Blood pressure 140/84, pulse 84, height 5\' 2"  (1.575 m), weight 184 lb (83.5 kg), last menstrual period 08/14/2012, SpO2 97 %. Systems examined below as of 03/26/18   General: No apparent distress alert and oriented x3 mood and affect normal, dressed appropriately.  HEENT: Pupils equal, extraocular movements intact  Respiratory: Patient's speak in full sentences and does not appear short of breath  Cardiovascular: No lower extremity edema, non tender, no erythema  Skin: Warm dry intact with no signs of infection or rash on extremities or on axial  skeleton.  Abdomen: Soft nontender  Neuro: Cranial nerves II through XII are intact, neurovascularly intact in all extremities with 2+ DTRs and 2+ pulses.  Lymph: No lymphadenopathy of posterior or anterior cervical chain or axillae bilaterally.  Gait antalgic MSK:  Non tender with full range of motion and good stability and symmetric strength and tone of shoulders, elbows, wrist, hip, and ankles bilaterally.   Knee: Right Knee effusion noted Palpation tenderness noted over the medial and lateral joint lines as well as the patellofemoral joint ROM lacks last 5 degrees extension and last 15 degrees of flexion with Ligaments with solid consistent endpoints including ACL, PCL, LCL, MCL. Negative Mcmurray's, Apley's, and Thessalonian tests. painful patellar compression. Patellar glide with mild crepitus. Patellar and quadriceps tendons unremarkable. Hamstring and quadriceps strength is normal.  Procedure: Real-time Ultrasound Guided Injection of right knee Device: GE Logiq Q7 Ultrasound guided injection is preferred based studies that show increased duration, increased effect, greater accuracy, decreased procedural pain, increased response rate, and decreased cost with ultrasound guided versus blind injection.  Verbal informed consent obtained.  Time-out conducted.  Noted no overlying erythema, induration, or other signs of local infection.  Skin prepped in a sterile fashion.  Local anesthesia: Topical Ethyl chloride.  With sterile technique and under real time ultrasound guidance: With a 22-gauge 2 inch needle patient was injected with 4 cc of 0.5% Marcaine and aspirated 45 cc of strawlike colored fluid then injected 1 cc of Kenalog 40 mg/dL. This was from a superior lateral approach.  Completed without difficulty  Pain immediately resolved suggesting accurate placement of the medication.  Advised to call if fevers/chills, erythema, induration, drainage, or persistent bleeding.  Images  permanently stored and available for review in the ultrasound unit.  Impression: Technically successful ultrasound guided injection.     Impression and Recommendations:     This case required medical decision making of moderate complexity.      Note: This dictation was prepared with Dragon dictation along with smaller phrase technology. Any transcriptional errors that result from this process are unintentional.

## 2018-03-26 NOTE — Assessment & Plan Note (Signed)
Patient had aspiration done.  Responded well.  Discussed icing regimen and home exercises.  Discussed compression, patient is going to try conservative therapy and follow-up with me again in 4 to 6 weeks

## 2018-03-26 NOTE — Patient Instructions (Addendum)
Good to see you  Drained the knee and I really hope it helps Ice is your friend.  Exercises 3 times a week.   Tart cherry extract any dose at night See me again in 4 weeks if not resolved or see me when you need me

## 2018-03-27 ENCOUNTER — Other Ambulatory Visit: Payer: Self-pay | Admitting: Family Medicine

## 2018-03-27 LAB — SYNOVIAL CELL COUNT + DIFF, W/ CRYSTALS
Basophils, %: 0 %
Eosinophils-Synovial: 0 % (ref 0–2)
Lymphocytes-Synovial Fld: 7 % (ref 0–74)
Monocyte/Macrophage: 49 % (ref 0–69)
Neutrophil, Synovial: 44 % — ABNORMAL HIGH (ref 0–24)
Synoviocytes, %: 0 % (ref 0–15)
WBC, Synovial: 4268 cells/uL — ABNORMAL HIGH (ref <150)

## 2018-03-27 MED ORDER — DOXYCYCLINE HYCLATE 100 MG PO TABS: 100.0000 mg | ORAL_TABLET | Freq: Two times a day (BID) | ORAL | 0 refills | Status: AC

## 2018-03-27 NOTE — Progress Notes (Signed)
Due to recent knee aspiration

## 2018-04-03 DIAGNOSIS — F10929 Alcohol use, unspecified with intoxication, unspecified: Secondary | ICD-10-CM | POA: Diagnosis not present

## 2018-04-03 DIAGNOSIS — R42 Dizziness and giddiness: Secondary | ICD-10-CM | POA: Diagnosis not present

## 2018-04-03 DIAGNOSIS — R21 Rash and other nonspecific skin eruption: Secondary | ICD-10-CM | POA: Diagnosis not present

## 2018-04-03 DIAGNOSIS — I959 Hypotension, unspecified: Secondary | ICD-10-CM | POA: Diagnosis not present

## 2018-04-04 ENCOUNTER — Emergency Department (HOSPITAL_COMMUNITY)
Admission: EM | Admit: 2018-04-04 | Discharge: 2018-04-04 | Disposition: A | Discharge status: Home / Self Care | Payer: BLUE CROSS/BLUE SHIELD | Attending: Emergency Medicine | Admitting: Emergency Medicine

## 2018-04-04 ENCOUNTER — Encounter (HOSPITAL_COMMUNITY): Payer: Self-pay | Admitting: Emergency Medicine

## 2018-04-04 ENCOUNTER — Telehealth: Payer: Self-pay | Admitting: Family Medicine

## 2018-04-04 ENCOUNTER — Emergency Department (HOSPITAL_COMMUNITY): Payer: BLUE CROSS/BLUE SHIELD

## 2018-04-04 ENCOUNTER — Other Ambulatory Visit: Payer: Self-pay

## 2018-04-04 DIAGNOSIS — Z87891 Personal history of nicotine dependence: Secondary | ICD-10-CM | POA: Diagnosis not present

## 2018-04-04 DIAGNOSIS — T450X5A Adverse effect of antiallergic and antiemetic drugs, initial encounter: Secondary | ICD-10-CM | POA: Diagnosis not present

## 2018-04-04 DIAGNOSIS — R42 Dizziness and giddiness: Secondary | ICD-10-CM | POA: Diagnosis not present

## 2018-04-04 DIAGNOSIS — R21 Rash and other nonspecific skin eruption: Secondary | ICD-10-CM | POA: Diagnosis not present

## 2018-04-04 DIAGNOSIS — T50905A Adverse effect of unspecified drugs, medicaments and biological substances, initial encounter: Secondary | ICD-10-CM | POA: Insufficient documentation

## 2018-04-04 DIAGNOSIS — Z79899 Other long term (current) drug therapy: Secondary | ICD-10-CM | POA: Insufficient documentation

## 2018-04-04 DIAGNOSIS — H9201 Otalgia, right ear: Secondary | ICD-10-CM | POA: Diagnosis not present

## 2018-04-04 DIAGNOSIS — E119 Type 2 diabetes mellitus without complications: Secondary | ICD-10-CM | POA: Diagnosis not present

## 2018-04-04 DIAGNOSIS — T7840XA Allergy, unspecified, initial encounter: Secondary | ICD-10-CM

## 2018-04-04 LAB — CBC WITH DIFFERENTIAL/PLATELET
Abs Immature Granulocytes: 0 10*3/uL (ref 0.0–0.1)
Basophils Absolute: 0 10*3/uL (ref 0.0–0.1)
Basophils Relative: 1 %
Eosinophils Absolute: 0.2 10*3/uL (ref 0.0–0.7)
Eosinophils Relative: 3 %
HCT: 38.3 % (ref 36.0–46.0)
Hemoglobin: 12.1 g/dL (ref 12.0–15.0)
Immature Granulocytes: 0 %
Lymphocytes Relative: 30 %
Lymphs Abs: 1.8 10*3/uL (ref 0.7–4.0)
MCH: 30.3 pg (ref 26.0–34.0)
MCHC: 31.6 g/dL (ref 30.0–36.0)
MCV: 95.8 fL (ref 78.0–100.0)
Monocytes Absolute: 0.5 10*3/uL (ref 0.1–1.0)
Monocytes Relative: 9 %
Neutro Abs: 3.3 10*3/uL (ref 1.7–7.7)
Neutrophils Relative %: 57 %
Platelets: 250 10*3/uL (ref 150–400)
RBC: 4 MIL/uL (ref 3.87–5.11)
RDW: 13.8 % (ref 11.5–15.5)
WBC: 5.8 10*3/uL (ref 4.0–10.5)

## 2018-04-04 LAB — BASIC METABOLIC PANEL
Anion gap: 12 (ref 5–15)
BUN: 12 mg/dL (ref 6–20)
CO2: 18 mmol/L — ABNORMAL LOW (ref 22–32)
Calcium: 10.4 mg/dL — ABNORMAL HIGH (ref 8.9–10.3)
Chloride: 109 mmol/L (ref 98–111)
Creatinine, Ser: 0.68 mg/dL (ref 0.44–1.00)
GFR calc Af Amer: >60 mL/min (ref >60)
GFR calc non Af Amer: >60 mL/min (ref >60)
Glucose, Bld: 113 mg/dL — ABNORMAL HIGH (ref 70–99)
Potassium: 3.7 mmol/L (ref 3.5–5.1)
Sodium: 139 mmol/L (ref 135–145)

## 2018-04-04 LAB — CBG MONITORING, ED: Glucose-Capillary: 116 mg/dL — ABNORMAL HIGH (ref 70–99)

## 2018-04-04 LAB — I-STAT TROPONIN, ED: Troponin i, poc: 0.01 ng/mL (ref 0.00–0.08)

## 2018-04-04 MED ORDER — SULFAMETHOXAZOLE-TRIMETHOPRIM 800-160 MG PO TABS: 1.0000 | ORAL_TABLET | Freq: Two times a day (BID) | ORAL | 0 refills | Status: AC

## 2018-04-04 MED ORDER — SULFAMETHOXAZOLE-TRIMETHOPRIM 800-160 MG PO TABS
1.0000 | ORAL_TABLET | Freq: Once | ORAL | Status: AC
  Administered 2018-04-04: 1 via ORAL
  Filled 2018-04-04: qty 1

## 2018-04-04 MED ORDER — FAMOTIDINE 20 MG PO TABS: 20.0000 mg | ORAL_TABLET | Freq: Two times a day (BID) | ORAL | 0 refills | Status: DC

## 2018-04-04 MED ORDER — FAMOTIDINE 20 MG PO TABS
20.0000 mg | ORAL_TABLET | Freq: Once | ORAL | Status: AC
  Administered 2018-04-04: 20 mg via ORAL
  Filled 2018-04-04: qty 1

## 2018-04-04 MED ORDER — SODIUM CHLORIDE 0.9 % IV BOLUS
1000.0000 mL | Freq: Once | INTRAVENOUS | Status: AC
  Administered 2018-04-04: 1000 mL via INTRAVENOUS

## 2018-04-04 MED ORDER — NEOMYCIN-POLYMYXIN-HC 3.5-10000-1 OT SUSP: 3.0000 [drp] | Freq: Three times a day (TID) | OTIC | 0 refills | Status: DC

## 2018-04-04 MED ORDER — SULFAMETHOXAZOLE-TRIMETHOPRIM 800-160 MG PO TABS: 1.0000 | ORAL_TABLET | Freq: Two times a day (BID) | ORAL | 0 refills | Status: DC

## 2018-04-04 NOTE — ED Notes (Signed)
Patient transported to CT 

## 2018-04-04 NOTE — ED Triage Notes (Signed)
Patient reports dizziness this evening , denies emesis or headache , no fever or chills .

## 2018-04-04 NOTE — Telephone Encounter (Signed)
Copied from [place] [id number]. Topic: Quick Communication - See Telephone Encounter >> Apr 04, 2018  2:07 PM Berneta Levins wrote: CRM for notification. See Telephone encounter for: 04/04/18.  Pt calling in:  States that she went to [facility] last night/early morning.  Pt states she was having an allergic reaction to doxycycline (VIBRA-TABS) 100 MG tablet given to her by Dr. Tamala Julian.  Pt states the hospital changed it to sulfamethoxazole-trimethoprim (BACTRIM DS,SEPTRA DS) 800-160 MG tablet.  She states she is getting ready to go and pick this up but pt wanted to make sure that this medication was going to do the same thing for the infection that the pt was given originally

## 2018-04-04 NOTE — Telephone Encounter (Signed)
See note

## 2018-04-04 NOTE — ED Provider Notes (Signed)
[facility] EMERGENCY DEPARTMENT Provider Note   CSN: [id number] Arrival date & time: 04/04/18  0023     History   Chief Complaint Chief Complaint  Patient presents with  . Dizziness    HPI Grace Lyons is a 57 y.o. female.  The history is provided by the patient.  Rash   This is a new problem. The current episode started 1 to 2 hours ago. The problem has been resolved. The problem is associated with new medications. There has been no fever. The rash is present on the right arm, left wrist, right wrist and left arm. The patient is experiencing no pain. Associated symptoms include itching. She has tried antihistamines (steroid cream) for the symptoms. The treatment provided significant relief. Risk factors: doxycycline for a knee infection.  Patient then reports dizziness (lighheadedness) after taking the benadryl and following it with wine.  She then stuck her finger in her right ear and had pain and then intense spinning.  It then resolved.    Past Medical History:  Diagnosis Date  . Arthritis   . Cervical polyp   . Fibroid   . H/O left knee surgery 09/2017  . Menometrorrhagia   . Vitamin D deficiency     Patient Active Problem List   Diagnosis Date Noted  . Menometrorrhagia 03/13/2018  . Polyp of cervix 03/13/2018  . Refuses statin 03/13/2018  . H/O arthroscopy 10/20/2017  . Instability of left knee joint 07/05/2017  . Degenerative arthritis of knee, bilateral 12/09/2015  . Other bilateral secondary osteoarthritis of knee 11/26/2014  . Pes planus of both feet 06/20/2014  . Loss of transverse plantar arch 06/20/2014  . Effusion of right knee 05/30/2014  . Knee pain, left 12/29/2010  . Type 2 diabetes mellitus without complication, without long-term current use of insulin ([place]) 12/23/2009  . Vitamin D deficiency 12/23/2009    Past Surgical History:  Procedure Laterality Date  . COMBINED HYSTEROSCOPY DIAGNOSTIC / D&C    . ENDOMETRIAL BIOPSY   12/17/2008  . HYSTEROSCOPY W/D&C  08/31/2012   Procedure: DILATATION AND CURETTAGE /HYSTEROSCOPY;  Surgeon: Delice Lesch, MD;  Location: [facility];  Service: Gynecology;  Laterality: N/A;  with resectoscope  . TUBAL LIGATION       OB History    Gravida  1   Para      Term      Preterm      AB      Living  1     SAB      TAB      Ectopic      Multiple      Live Births               Home Medications    Prior to Admission medications   Medication Sig Start Date End Date Taking? Authorizing Provider  Cholecalciferol (VITAMIN D3) 1000 UNITS CAPS Take 1,000 Units by mouth daily.     [provider]  doxycycline (VIBRA-TABS) 100 MG tablet Take 1 tablet (100 mg total) by mouth 2 (two) times daily for 10 days. 03/27/18 04/06/18  Lyndal Pulley, DO  hydrocortisone 2.5 % lotion Apply topically 2 (two) times daily. 01/08/18   Wynona Luna, MD  Omega-3 Fatty Acids (OMEGA 3 PO) Take 1 capsule by mouth daily.     [provider]  predniSONE (DELTASONE) 50 MG tablet Take 1 tablet (50 mg total) by mouth daily. 03/26/18   Lyndal Pulley, DO  Family History Family History  Problem Relation Age of Onset  . Diabetes Mother   . Hypertension Mother   . Diabetes Father   . Hypertension Father     Social History Social History   Tobacco Use  . Smoking status: Former Smoker    Last attempt to quit: 01/12/1996    Years since quitting: 22.2  . Smokeless tobacco: Never Used  . Tobacco comment: Single, lives alone. 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 Alcohol use: No  . Drug use: No     Allergies   Doxycycline   Review of Systems Review of Systems  Constitutional: Negative for fever.  HENT: Positive for ear pain. Negative for facial swelling, sinus pressure, sinus pain, tinnitus, trouble swallowing and voice change.   Eyes: Negative for photophobia and visual disturbance.    Respiratory: Negative for shortness of breath.   Cardiovascular: Negative for chest pain, palpitations and leg swelling.  Gastrointestinal: Negative for abdominal pain, nausea and vomiting.  Genitourinary: Negative for flank pain.  Musculoskeletal: Negative for back pain, neck pain and neck stiffness.  Skin: Positive for itching and rash.  Neurological: Positive for dizziness and light-headedness. Negative for tremors, seizures, syncope, facial asymmetry, speech difficulty, weakness, numbness and headaches.  All other systems reviewed and are negative.    Physical Exam Updated Vital Signs BP 104/65   Pulse 70   Resp 20   Ht 5\' 2"  (1.575 m)   Wt 80.7 kg   LMP 08/14/2012   SpO2 98%   BMI 32.56 kg/m   Physical Exam  Constitutional: She is oriented to person, place, and time. She appears well-developed and well-nourished. No distress.  HENT:  Head: Normocephalic and atraumatic.  Right Ear: Tympanic membrane is not injected, not scarred, not perforated, not erythematous and not retracted. No hemotympanum.  Left Ear: Tympanic membrane is not injected, not scarred, not perforated, not erythematous and not retracted. No hemotympanum.  Nose: Nose normal.  Mouth/Throat: Oropharynx is clear and moist. No oropharyngeal exudate.  Abrasion on the right canal floor  Eyes: Pupils are equal, round, and reactive to light. Conjunctivae and EOM are normal.  Neck: Normal range of motion. Neck supple.  Cardiovascular: Normal rate, regular rhythm, normal heart sounds and intact distal pulses.  Pulmonary/Chest: Effort normal and breath sounds normal. No stridor. She has no wheezes. She has no rales.  Abdominal: Soft. Bowel sounds are normal. She exhibits no mass. There is no tenderness. There is no rebound and no guarding.  Musculoskeletal: Normal range of motion.  Neurological: She is alert and oriented to person, place, and time. She displays normal reflexes. No cranial nerve deficit or sensory  deficit. She exhibits normal muscle tone. Coordination normal.  Skin: Skin is warm and dry. Capillary refill takes less than 2 seconds. She is not diaphoretic.  Psychiatric: She has a normal mood and affect.  Nursing note and vitals reviewed.    ED Treatments / Results  Labs (all labs ordered are listed, but only abnormal results are displayed) Results for orders placed or performed during the hospital encounter of 04/04/18  CBC with Differential  Result Value Ref Range   WBC 5.8 4.0 - 10.5 K/uL   RBC 4.00 3.87 - 5.11 MIL/uL   Hemoglobin 12.1 12.0 - 15.0 g/dL   HCT 38.3 36.0 - 46.0 %   MCV 95.8 78.0 - 100.0 fL   MCH 30.3 26.0 - 34.0  pg   MCHC 31.6 30.0 - 36.0 g/dL   RDW 13.8 11.5 - 15.5 %   Platelets 250 150 - 400 K/uL   Neutrophils Relative % 57 %   Neutro Abs 3.3 1.7 - 7.7 K/uL   Lymphocytes Relative 30 %   Lymphs Abs 1.8 0.7 - 4.0 K/uL   Monocytes Relative 9 %   Monocytes Absolute 0.5 0.1 - 1.0 K/uL   Eosinophils Relative 3 %   Eosinophils Absolute 0.2 0.0 - 0.7 K/uL   Basophils Relative 1 %   Basophils Absolute 0.0 0.0 - 0.1 K/uL   Immature Granulocytes 0 %   Abs Immature Granulocytes 0.0 0.0 - 0.1 K/uL  Basic metabolic panel  Result Value Ref Range   Sodium 139 135 - 145 mmol/L   Potassium 3.7 3.5 - 5.1 mmol/L   Chloride 109 98 - 111 mmol/L   CO2 18 (L) 22 - 32 mmol/L   Glucose, Bld 113 (H) 70 - 99 mg/dL   BUN 12 6 - 20 mg/dL   Creatinine, Ser 0.68 0.44 - 1.00 mg/dL   Calcium 10.4 (H) 8.9 - 10.3 mg/dL   GFR calc non Af Amer >60 >60 mL/min   GFR calc Af Amer >60 >60 mL/min   Anion gap 12 5 - 15  CBG monitoring, ED  Result Value Ref Range   Glucose-Capillary 116 (H) 70 - 99 mg/dL  I-stat troponin, ED  Result Value Ref Range   Troponin i, poc 0.01 0.00 - 0.08 ng/mL   Comment 3           Ct Head Wo Contrast  Result Date: 04/04/2018 CLINICAL DATA:  Sudden onset of dizziness. EXAM: CT HEAD WITHOUT CONTRAST TECHNIQUE: Contiguous axial images were obtained from  the base of the skull through the vertex without intravenous contrast. COMPARISON:  None. FINDINGS: Brain: No intracranial hemorrhage, mass effect, or midline shift. No hydrocephalus. The basilar cisterns are patent. No evidence of territorial infarct or acute ischemia. No extra-axial or intracranial fluid collection. Vascular: No hyperdense vessel or unexpected calcification. Skull: No fracture or focal lesion. Sinuses/Orbits: Paranasal sinuses and mastoid air cells are clear. The visualized orbits are unremarkable. Other: None. IMPRESSION: Negative head CT. Electronically Signed   By: Jeb Levering M.D.   On: 04/04/2018 04:17   [place] [employer] Joint Space Structures Low Right  Result Date: 03/29/2018 Procedure: Real-time Ultrasound Guided Injection of right knee Device: GE Logiq Q7 Ultrasound guided injection is preferred based studies that show increased duration, increased effect, greater accuracy, decreased procedural pain, increased response rate, and decreased cost with ultrasound guided versus blind injection. Verbal informed consent obtained. Time-out conducted. Noted no overlying erythema, induration, or other signs of local infection. Skin prepped in a sterile fashion. Local anesthesia: Topical Ethyl chloride. With sterile technique and under real time ultrasound guidance: With a 22-gauge 2 inch needle patient was injected with 4 cc of 0.5% Marcaine and aspirated 45 cc of strawlike colored fluid then injected 1 cc of Kenalog 40 mg/dL. This was from a superior lateral approach. Completed without difficulty Pain immediately resolved suggesting accurate placement of the medication. Advised to call if fevers/chills, erythema, induration, drainage, or persistent bleeding. Images permanently stored and available for review in the ultrasound unit. Impression: Technically successful ultrasound guided injection.    EKG EKG Interpretation  Date/Time:  Wednesday April 04 2018 00:33:27 EDT Ventricular Rate:   79 PR Interval:  162 QRS Duration: 96 QT Interval:  376 QTC Calculation: 431 R Axis:  25 Text Interpretation:  Normal sinus rhythm Incomplete right bundle branch block Nonspecific T wave abnormality has not changed Confirmed by Randal Buba, Ivy Meriwether ([id number]) on 04/04/2018 2:26:54 AM   Radiology Ct Head Wo Contrast  Result Date: 04/04/2018 CLINICAL DATA:  Sudden onset of dizziness. EXAM: CT HEAD WITHOUT CONTRAST TECHNIQUE: Contiguous axial images were obtained from the base of the skull through the vertex without intravenous contrast. COMPARISON:  None. FINDINGS: Brain: No intracranial hemorrhage, mass effect, or midline shift. No hydrocephalus. The basilar cisterns are patent. No evidence of territorial infarct or acute ischemia. No extra-axial or intracranial fluid collection. Vascular: No hyperdense vessel or unexpected calcification. Skull: No fracture or focal lesion. Sinuses/Orbits: Paranasal sinuses and mastoid air cells are clear. The visualized orbits are unremarkable. Other: None. IMPRESSION: Negative head CT. Electronically Signed   By: Jeb Levering M.D.   On: 04/04/2018 04:17    Procedures Procedures (including critical care time)  Medications Ordered in ED Medications  sodium chloride 0.9 % bolus 1,000 mL (has no administration in time range)  sulfamethoxazole-trimethoprim (BACTRIM DS,SEPTRA DS) 800-160 MG per tablet 1 tablet (has no administration in time range)  famotidine (PEPCID) tablet 20 mg (has no administration in time range)     Patient PO challenged successfully in the ED.    Final Clinical Impressions(s) / ED Diagnoses   Final diagnoses:  Right ear pain  Adverse effect of drug, initial encounter   Call your orthopedic surgeon in the am to discuss whether antibiotics are necessary and to inform them of the allergic reaction to doxycycline.  Do not drink wine while taking benadryl or muscle relaxants.  Take benadryl every 6 hours x 2 days and pepcid as a second  anti histamine.  Will start drops for trauma to the ear canal.    I have listed doxycycline as an allergy. Stop this drug immediately.  Treated for orthostasis. I will start pepcid as a second antihistamine.  Will change her antibiotics to bactrim DS.   Return for fevers >100.4 unrelieved by medication, shortness of breath, intractable vomiting, or diarrhea,  Inability to tolerate liquids or food, cough, altered mental status or any concerns. No signs of systemic illness or infection. The patient is nontoxic-appearing on exam and vital signs are within normal limits. Will refer to urology for microscopy hematuria as patient is asymptomatic.Will refer to ENT if symptoms do not improve.    I have reviewed the triage vital signs and the nursing notes. Pertinent labs &imaging results that were available during my care of the patient were reviewed by me and considered in my medical decision making (see chart for details).  After history, exam, and medical workup I feel the patient has been appropriately medically screened and is safe for discharge home. Pertinent diagnoses were discussed with the patient. Patient was given return precautions.    Nevae Pinnix, MD 04/04/18 [phone]

## 2018-04-04 NOTE — Telephone Encounter (Signed)
Left message for patient to return call.

## 2018-04-07 NOTE — Telephone Encounter (Signed)
Patient never returned call  

## 2018-04-16 ENCOUNTER — Other Ambulatory Visit: Payer: Self-pay

## 2018-04-16 ENCOUNTER — Ambulatory Visit (HOSPITAL_COMMUNITY)
Admission: EM | Admit: 2018-04-16 | Discharge: 2018-04-16 | Disposition: A | Discharge status: Home / Self Care | Payer: BLUE CROSS/BLUE SHIELD | Attending: Family Medicine | Admitting: Family Medicine

## 2018-04-16 ENCOUNTER — Encounter (HOSPITAL_COMMUNITY): Payer: Self-pay | Admitting: Emergency Medicine

## 2018-04-16 DIAGNOSIS — L309 Dermatitis, unspecified: Secondary | ICD-10-CM

## 2018-04-16 DIAGNOSIS — H60543 Acute eczematoid otitis externa, bilateral: Secondary | ICD-10-CM

## 2018-04-16 MED ORDER — CLOTRIMAZOLE-BETAMETHASONE 1-0.05 % EX CREA: TOPICAL_CREAM | CUTANEOUS | 0 refills | Status: DC

## 2018-04-16 MED ORDER — CIPROFLOXACIN-DEXAMETHASONE 0.3-0.1 % OT SUSP: 4.0000 [drp] | Freq: Two times a day (BID) | OTIC | 0 refills | Status: DC

## 2018-04-16 NOTE — ED Triage Notes (Signed)
Bilateral ears itching, particularly right ear.  Ears have been itching since 8/20, since return trip from [place].  Patient says she has fluid draining from ears intermittently

## 2018-04-19 NOTE — ED Provider Notes (Signed)
[facility]   [id number] 04/16/18 Arrival Time: 9628  ASSESSMENT & PLAN:  1. Dermatitis of external ear   2. Dermatitis of ear canal, bilateral     Meds ordered this encounter  Medications  . clotrimazole-betamethasone (LOTRISONE) cream    Sig: Apply to affected area 2 times daily for up to one week.    Dispense:  45 g    Refill:  0  . ciprofloxacin-dexamethasone (CIPRODEX) OTIC suspension    Sig: Place 4 drops into both ears 2 (two) times daily.    Dispense:  7.5 mL    Refill:  0   May f/u with PCP or here as needed.  Reviewed expectations re: course of current medical issues. Questions answered. Outlined signs and symptoms indicating need for more acute intervention. Patient verbalized understanding. After Visit Summary given.   SUBJECTIVE: History from: patient.  Grace Lyons is a 57 y.o. female who presents with complaint of bilateral ear itching and discomfort; without drainage; without bleeding. Onset gradual, over the past 2-3 weeks. Recent cold symptoms: none. Fever: no. Overall normal PO intake without n/v. Sick contacts: no. OTC treatment: none reported. Normal hearing.  Social History   Tobacco Use  Smoking Status Former Smoker  . Last attempt to quit: 01/12/1996  . Years since quitting: 22.2  Smokeless Tobacco Never Used  Tobacco Comment   Single, lives alone. 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   OBJECTIVE:  Vitals:   04/16/18 1157  BP: 124/78  Pulse: 61  Resp: 18  Temp: 98.4 F (36.9 C)  TempSrc: Oral  SpO2: 97%     General appearance: alert; appears fatigued Ear Canal: bilateral; mildly inflamed with very dry and irritated skin at opening of canals; no purulent debris; does have significant ear wax TM: bilateral: portions visible appear normal Neck: supple without LAD Lungs: unlabored respirations, symmetrical air entry; no respiratory distress Skin: warm and  dry Psychological: alert and cooperative; normal mood and affect  Allergies  Allergen Reactions  . Doxycycline Rash    Past Medical History:  Diagnosis Date  . Arthritis   . Cervical polyp   . Fibroid   . H/O left knee surgery 09/2017  . Menometrorrhagia   . Vitamin D deficiency    Family History  Problem Relation Age of Onset  . Diabetes Mother   . Hypertension Mother   . Diabetes Father   . Hypertension Father    Social History   Socioeconomic History  . Marital status: Single    Spouse name: Not on file  . Number of children: Not on file  . Years of education: Not on file  . Highest education level: Not on file  Occupational History    Employer: [employer]  Social Needs  . Financial resource strain: Not on file  . Food insecurity:    Worry: Not on file    Inability: Not on file  . Transportation needs:    Medical: Not on file    Non-medical: Not on file  Tobacco Use  . Smoking status: Former Smoker    Last attempt to quit: 01/12/1996    Years since quitting: 22.2  . Smokeless tobacco: Never Used  . 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 Alcohol use: No  .  Drug use: No  . Sexual activity: Not Currently    Birth control/protection: Surgical    Comment: BTL  Lifestyle  . Physical activity:    Days per week: Not on file    Minutes per session: Not on file  . Stress: Not on file  Relationships  . Social connections:    Talks on phone: Not on file    Gets together: Not on file    Attends religious service: Not on file    Active member of club or organization: Not on file    Attends meetings of clubs or organizations: Not on file    Relationship status: Not on file  . Intimate partner violence:    Fear of current or ex partner: Not on file    Emotionally abused: Not on file    Physically abused: Not on file    Forced sexual  activity: Not on file  Other Topics Concern  . Not on file  Social History Narrative  . Not on file            Vanessa Kick, MD 04/19/18 [phone]

## 2018-04-30 DIAGNOSIS — L218 Other seborrheic dermatitis: Secondary | ICD-10-CM | POA: Diagnosis not present

## 2018-04-30 DIAGNOSIS — L2089 Other atopic dermatitis: Secondary | ICD-10-CM | POA: Diagnosis not present

## 2018-05-03 ENCOUNTER — Ambulatory Visit (HOSPITAL_COMMUNITY)
Admission: EM | Admit: 2018-05-03 | Discharge: 2018-05-03 | Disposition: A | Discharge status: Home / Self Care | Payer: BLUE CROSS/BLUE SHIELD | Attending: Family Medicine | Admitting: Family Medicine

## 2018-05-03 ENCOUNTER — Encounter (HOSPITAL_COMMUNITY): Payer: Self-pay | Admitting: Emergency Medicine

## 2018-05-03 DIAGNOSIS — H66003 Acute suppurative otitis media without spontaneous rupture of ear drum, bilateral: Secondary | ICD-10-CM

## 2018-05-03 MED ORDER — AMOXICILLIN-POT CLAVULANATE 875-125 MG PO TABS: 1.0000 | ORAL_TABLET | Freq: Two times a day (BID) | ORAL | 0 refills | Status: AC

## 2018-05-03 NOTE — ED Triage Notes (Signed)
Pt sts bilateral ear pain since August; pt sts drops have not resolved

## 2018-05-03 NOTE — ED Provider Notes (Signed)
[facility]   [id number] 05/03/18 Arrival Time: 8841  CC:EAR PAIN  SUBJECTIVE: History from: patient.  Grace Lyons is a 57 y.o. female who presents with bilateral ear pain and itching that began in August.  Was seen at [facility] on 04/16/18 and treated with ciprodex and clotrimazole with mild relief.  Denies a precipitating event, such as swimming or wearing ear plugs.  States ears are constantly itchy.  Some left ear pain now.    Denies fever, chills, fatigue, sinus pain, rhinorrhea, ear discharge, sore throat, SOB, wheezing, chest pain, nausea, changes in bowel or bladder habits.    ROS: As per HPI.  Past Medical History:  Diagnosis Date  . Arthritis   . Cervical polyp   . Fibroid   . H/O left knee surgery 09/2017  . Menometrorrhagia   . Vitamin D deficiency    Past Surgical History:  Procedure Laterality Date  . COMBINED HYSTEROSCOPY DIAGNOSTIC / D&C    . ENDOMETRIAL BIOPSY  12/17/2008  . HYSTEROSCOPY W/D&C  08/31/2012   Procedure: DILATATION AND CURETTAGE /HYSTEROSCOPY;  Surgeon: Delice Lesch, MD;  Location: [facility];  Service: Gynecology;  Laterality: N/A;  with resectoscope  . KNEE SURGERY    . TUBAL LIGATION  1995   Allergies  Allergen Reactions  . Doxycycline Rash   No current facility-administered medications on file prior to encounter.    Current Outpatient Medications on File Prior to Encounter  Medication Sig Dispense Refill  . Cholecalciferol (VITAMIN D3) 1000 UNITS CAPS Take 1,000 Units by mouth daily.     . clotrimazole-betamethasone (LOTRISONE) cream Apply to affected area 2 times daily for up to one week. 45 g 0  . famotidine (PEPCID) 20 MG tablet Take 1 tablet (20 mg total) by mouth 2 (two) times daily. 30 tablet 0  . Omega-3 Fatty Acids (OMEGA 3 PO) Take 1 capsule by mouth daily.      Social History   Socioeconomic History  . Marital status: Single    Spouse name: Not on file  . Number of children: Not on file  . Years of education: Not on  file  . Highest education level: Not on file  Occupational History    Employer: [employer]  Social Needs  . Financial resource strain: Not on file  . Food insecurity:    Worry: Not on file    Inability: Not on file  . Transportation needs:    Medical: Not on file    Non-medical: Not on file  Tobacco Use  . Smoking status: Former Smoker    Last attempt to quit: 01/12/1996    Years since quitting: 22.3  . Smokeless tobacco: Never Used  . 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 Alcohol use: No  . Drug use: No  . Sexual activity: Not Currently    Birth control/protection: Surgical    Comment: BTL  Lifestyle  . Physical activity:    Days per week: Not on file    Minutes per session: Not on file  . Stress: Not on file  Relationships  . Social connections:    Talks on phone: Not on file    Gets together: Not on file    Attends religious service: Not on file    Active member of club or organization: Not on file    Attends meetings of clubs or  organizations: Not on file    Relationship status: Not on file  . Intimate partner violence:    Fear of current or ex partner: Not on file    Emotionally abused: Not on file    Physically abused: Not on file    Forced sexual activity: Not on file  Other Topics Concern  . Not on file  Social History Narrative  . Not on file   Family History  Problem Relation Age of Onset  . Diabetes Mother   . Hypertension Mother   . Diabetes Father   . Hypertension Father     OBJECTIVE:  Vitals:   05/03/18 1411  BP: (!) 138/97  Pulse: 78  Resp: 18  Temp: 98.6 F (37 C)  TempSrc: Oral  SpO2: 97%     General appearance: alert; nontoxic appearance HEENT: Ears: EACs erythematous, TMs injected with cloudy yellow fluid behind the TMs:  Eyes: PERRL, EOMI grossly; Nose: clear rhinorrhea; Throat: oropharynx clear,  tonsils not enlarged or erythematous without white tonsillar exudates, uvula midline Neck: supple without LAD Lungs: unlabored respirations, symmetrical air entry; cough: absent; no respiratory distress Heart: regular rate and rhythm.  Radial pulses 2+ symmetrical bilaterally Skin: warm and dry Psychological: alert and cooperative; normal mood and affect  ASSESSMENT & PLAN:  1. Non-recurrent acute suppurative otitis media of both ears without spontaneous rupture of tympanic membranes     Meds ordered this encounter  Medications  . amoxicillin-clavulanate (AUGMENTIN) 875-125 MG tablet    Sig: Take 1 tablet by mouth every 12 (twelve) hours for 10 days.    Dispense:  20 tablet    Refill:  0    Order Specific Question:   Supervising Provider    Answer:   Wynona Luna [[id number]]    Rest and drink plenty of fluids Prescribed augmentin 875 for 7-10 days Take medications as directed and to completion Continue to use OTC ibuprofen and/ or tylenol as needed for pain control Follow up with PCP if symptoms persists Return here or go to the ER if you have any new or worsening symptoms   Reviewed expectations re: course of current medical issues. Questions answered. Outlined signs and symptoms indicating need for more acute intervention. Patient verbalized understanding. After Visit Summary given.         Lestine Box, PA-C 05/03/18 1521

## 2018-05-03 NOTE — Discharge Instructions (Signed)
Rest and drink plenty of fluids Prescribed augmentin 875 for 7-10 days Take medications as directed and to completion Continue to use OTC ibuprofen and/ or tylenol as needed for pain control Follow up with PCP if symptoms persists Return here or go to the ER if you have any new or worsening symptoms  

## 2018-05-03 NOTE — ED Notes (Signed)
Bed: UC01 Expected date:  Expected time:  Means of arrival:  Comments: Appointments 

## 2018-05-24 ENCOUNTER — Ambulatory Visit: Payer: Self-pay | Admitting: Allergy

## 2018-06-21 ENCOUNTER — Other Ambulatory Visit: Payer: Self-pay

## 2018-06-21 ENCOUNTER — Encounter: Payer: Self-pay | Admitting: Family Medicine

## 2018-06-21 ENCOUNTER — Ambulatory Visit: Payer: BLUE CROSS/BLUE SHIELD | Admitting: Family Medicine

## 2018-06-21 VITALS — BP 124/78 | Temp 98.6°F | Ht 62.0 in | Wt 179.0 lb

## 2018-06-21 DIAGNOSIS — E119 Type 2 diabetes mellitus without complications: Secondary | ICD-10-CM | POA: Diagnosis not present

## 2018-06-21 DIAGNOSIS — Z23 Encounter for immunization: Secondary | ICD-10-CM

## 2018-06-21 DIAGNOSIS — H9201 Otalgia, right ear: Secondary | ICD-10-CM | POA: Diagnosis not present

## 2018-06-21 DIAGNOSIS — H60501 Unspecified acute noninfective otitis externa, right ear: Secondary | ICD-10-CM

## 2018-06-21 LAB — POCT GLYCOSYLATED HEMOGLOBIN (HGB A1C): HbA1c, POC (controlled diabetic range): 5.9 % (ref 0.0–7.0)

## 2018-06-21 NOTE — Patient Instructions (Signed)
Thank you for coming to see me today. It was a pleasure meeting you! Today we talked about:   Your ear fullness and itching. I have placed a referral to an ENT to help with this.   Please continue doing what you're doing with your diabetes and diet! Your A1c is very well controlled today.   Please follow-up with me in 6 months or sooner as needed.  If you have any questions or concerns, please do not hesitate to call the office at [phone].  Take Care,   [place] Braniyah Besse, DO   Diet Recommendations for Diabetes   Starchy (carb) foods: Bread, rice, pasta, potatoes, corn, cereal, grits, crackers, bagels, muffins, all baked goods.  (Fruits, milk, and yogurt also have carbohydrate, but most of these foods will not spike your blood sugar as most starchy foods will.)  A few fruits do cause high blood sugars; use small portions of bananas (limit to 1/2 at a time), grapes, watermelon, oranges, and most tropical fruits.    Protein foods: Meat, fish, poultry, eggs, dairy foods, and beans such as pinto and kidney beans (beans also provide carbohydrate).   1. Eat at least 3 meals and 1-2 snacks per day. Never go more than 4-5 hours while awake without eating. Eat breakfast within the first hour of getting up.   2. Limit starchy foods to TWO per meal and ONE per snack. ONE portion of a starchy  food is equal to the following:   - ONE slice of bread (or its equivalent, such as half of a hamburger bun).   - 1/2 cup of a "scoopable" starchy food such as potatoes or rice.   - 15 grams of Total Carbohydrate as shown on food label.  3. Include at every meal: a protein food, a carb food, and vegetables and/or fruit.   - Obtain twice the volume of veg's as protein or carbohydrate foods for both lunch and dinner.   - Fresh or frozen veg's are best.   - Keep frozen veg's on hand for a quick vegetable serving.

## 2018-06-21 NOTE — Progress Notes (Signed)
Subjective:    Patient ID: Grace Lyons, female    DOB: [birth year], 57 y.o.   MRN: [id number]   CC:  HPI: Diet-controlled Diabetes:  Last A1c 6.6 on 02/2018 Taking medications: patient refused metfomin Not On Aspirin or statin Last eye exam: recently  Last foot exam: up to date ROS: denies dizziness, diaphoresis, LOC, polyuria, polydipsia  R ear itch: Patient reports that her roommate came home complaining of ear pain and itch about 2 months ago and patient then began experiencing same sx. Reports she often has popping in her ear. Denies any bleeding or discharge from ear. Reports they often feel clogged, but no true hearing loss. Denies tinnitus. Patient requesting to be seen by ENT as she has tried multiple medications form many doctor's offices. Has tried hydrocortisone, alcohol, hydrogen peroxide, bactrim, augmentin, doxycycline (caused rash and di not complete)  Review of Systems  Constitutional: Negative for chills and fever.  HENT: Positive for ear pain.        Itching in R ear  Eyes: Negative for blurred vision and double vision.  Respiratory: Negative for cough and shortness of breath.   Cardiovascular: Negative for chest pain.  Gastrointestinal: Negative for constipation, diarrhea, heartburn, nausea and vomiting.  Genitourinary: Negative for dysuria.  Skin: Negative.   Neurological: Negative for headaches.  Endo/Heme/Allergies: Negative.   Psychiatric/Behavioral: Negative.    Patient Active Problem List   Diagnosis Date Noted  . Acute otitis externa of right ear 06/23/2018  . Menometrorrhagia 03/13/2018  . Polyp of cervix 03/13/2018  . Refuses statin 03/13/2018  . H/O arthroscopy 10/20/2017  . Instability of left knee joint 07/05/2017  . Degenerative arthritis of knee, bilateral 12/09/2015  . Other bilateral secondary osteoarthritis of knee 11/26/2014  . Pes planus of both feet 06/20/2014  . Loss of transverse plantar arch 06/20/2014  . Effusion of right knee  05/30/2014  . Knee pain, left 12/29/2010  . Type 2 diabetes mellitus without complication, without long-term current use of insulin ([place]) 12/23/2009  . Vitamin D deficiency 12/23/2009     Family History  Problem Relation Age of Onset  . Diabetes Mother   . Hypertension Mother   . Heart disease Mother   . Diabetes Father   . Hypertension Father   . Prostate cancer Father     Past Medical History:  Diagnosis Date  . Cervical polyp   . Fibroid   . H/O left knee surgery 09/2017  . Menometrorrhagia   . Vitamin D deficiency     Social Hx: Denies tobacco use, alcohol use, or illicit drug use.  Objective:  BP 124/78   Temp 98.6 F (37 C) (Oral)   Ht 5\' 2"  (1.575 m)   Wt 179 lb (81.2 kg)   LMP 08/14/2012   BMI 32.74 kg/m  Vitals and nursing note reviewed  General: NAD, pleasant Head: Atraumatic Ears: BL TM with no effusion, erythema and normal cone of light, R canal with erythema Neck: Supple Cardiac: RRR, normal heart sounds, no murmurs Respiratory: CTAB, normal effort Abdomen: soft, nontender, nondistended. Bowel sounds present Extremities: no edema or cyanosis. WWP. MSK: normal gait Skin: warm and dry, no rashes noted Neuro: alert and oriented, no focal deficits Psych: Neatly groomed and appropriately dressed. Maintains good eye contact and is cooperative and attentive. Speech is normal volume and rate. Denies SI/ HI. Normal affect.  Assessment & Plan:    Acute otitis externa of right ear Patient requesting ENT referral for her otitis externa that  she has had since September. Patient with no tinnitus, hearing loss or other red flag sx. Given return precautions.   Type 2 diabetes mellitus without complication, without long-term current use of insulin ([facility]) A1c 6.1 today, patient diet controlled and not interested in starting metformin, or statin therapy. Counseled on benefits vs risk. However patient has brought her A1c down with diet and exercise alone and  encouraged patient to continue her current life-style changes.   Health Maintenance: Patient received flu vaccine today, will offer pna vaccine at next appointment.   [place] Charise Leinbach, DO Family Medicine Resident, PGY-2

## 2018-06-23 DIAGNOSIS — H60501 Unspecified acute noninfective otitis externa, right ear: Secondary | ICD-10-CM | POA: Insufficient documentation

## 2018-06-23 NOTE — Assessment & Plan Note (Addendum)
A1c 6.1 today (previously 6.6), patient diet controlled and not interested in starting metformin, or statin therapy. Counseled on benefits vs risk. However patient has brought her A1c down with diet and exercise alone and encouraged patient to continue her current life-style changes.

## 2018-06-23 NOTE — Assessment & Plan Note (Signed)
Patient requesting ENT referral for her otitis externa that she has had since September. Patient with no tinnitus, hearing loss or other red flag sx. Given return precautions.

## 2018-07-23 DIAGNOSIS — L299 Pruritus, unspecified: Secondary | ICD-10-CM | POA: Diagnosis not present

## 2018-07-23 DIAGNOSIS — H9201 Otalgia, right ear: Secondary | ICD-10-CM | POA: Diagnosis not present

## 2018-10-25 ENCOUNTER — Ambulatory Visit (HOSPITAL_COMMUNITY)
Admission: EM | Admit: 2018-10-25 | Discharge: 2018-10-25 | Disposition: A | Discharge status: Home / Self Care | Payer: BLUE CROSS/BLUE SHIELD | Attending: Urgent Care | Admitting: Urgent Care

## 2018-10-25 ENCOUNTER — Other Ambulatory Visit: Payer: Self-pay

## 2018-10-25 ENCOUNTER — Encounter (HOSPITAL_COMMUNITY): Payer: Self-pay

## 2018-10-25 DIAGNOSIS — M62838 Other muscle spasm: Secondary | ICD-10-CM | POA: Diagnosis not present

## 2018-10-25 DIAGNOSIS — M542 Cervicalgia: Secondary | ICD-10-CM | POA: Diagnosis not present

## 2018-10-25 DIAGNOSIS — M79602 Pain in left arm: Secondary | ICD-10-CM

## 2018-10-25 MED ORDER — CYCLOBENZAPRINE HCL 10 MG PO TABS: 10.0000 mg | ORAL_TABLET | Freq: Three times a day (TID) | ORAL | 0 refills | Status: DC | PRN

## 2018-10-25 MED ORDER — KETOROLAC TROMETHAMINE 60 MG/2ML IM SOLN
INTRAMUSCULAR | Status: AC
  Filled 2018-10-25: qty 2

## 2018-10-25 MED ORDER — KETOROLAC TROMETHAMINE 60 MG/2ML IM SOLN
60.0000 mg | Freq: Once | INTRAMUSCULAR | Status: AC
  Administered 2018-10-25: 60 mg via INTRAMUSCULAR

## 2018-10-25 MED ORDER — MELOXICAM 15 MG PO TABS: 15.0000 mg | ORAL_TABLET | Freq: Every day | ORAL | 1 refills | Status: DC
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
## 2018-11-08 ENCOUNTER — Telehealth: Payer: Self-pay | Admitting: Family Medicine

## 2018-11-08 NOTE — Telephone Encounter (Signed)
Patient called as she started to have palpitations around 10 PM last night.  They self resolved after laying down though she did have some associated shortness of breath.  Earlier today she was at [facility] and walking around the sensation reoccurred.  She did not have any chest pain.  She would like to be seen.  Appointment made in access to care at 9:10 AM.  As I am the provider at that time I will assess her.  Discussed that need to go to ER if she has chest pain in the intervening time.  Bufford Lope, DO PGY-3, [facility] 11/08/2018 3:02 PM

## 2018-11-09 ENCOUNTER — Other Ambulatory Visit: Payer: Self-pay

## 2018-11-09 ENCOUNTER — Ambulatory Visit (HOSPITAL_COMMUNITY)
Admission: RE | Admit: 2018-11-09 | Discharge: 2018-11-09 | Disposition: A | Discharge status: Home / Self Care | Payer: BLUE CROSS/BLUE SHIELD | Source: Ambulatory Visit | Attending: Family Medicine | Admitting: Family Medicine

## 2018-11-09 ENCOUNTER — Ambulatory Visit: Payer: BLUE CROSS/BLUE SHIELD | Admitting: Family Medicine

## 2018-11-09 VITALS — BP 114/64 | HR 71 | Temp 98.5°F | Wt 179.0 lb

## 2018-11-09 DIAGNOSIS — R002 Palpitations: Secondary | ICD-10-CM | POA: Diagnosis not present

## 2018-11-09 NOTE — Assessment & Plan Note (Signed)
Patient is currently asymptomatic today.  EKG performed which showed sinus rhythm with no changes from previous. Check CBC, BMP, TSH.  Reviewed return precautions again.  Patient voiced good understanding that should she have chest pain, syncope, associated shortness of breath that she should go the ER for evaluation.

## 2018-11-09 NOTE — Patient Instructions (Signed)
It was good to see you today!   We are checking some labs today. If results require attention, either myself or my nurse will get in touch with you. If everything is normal, you will get a letter in the mail or a message in My Chart. Please give [place] a call if you do not hear from [place] after 2 weeks.    Palpitations Palpitations are feelings that your heartbeat is not normal. Your heartbeat may feel like it is:  Uneven.  Faster than normal.  Fluttering.  Skipping a beat. This is usually not a serious problem. In some cases, you may need tests to rule out any serious problems. Follow these instructions at home: Pay attention to any changes in your condition. Take these actions to help manage your symptoms: Eating and drinking  Avoid: ? Coffee, tea, soft drinks, and energy drinks. ? Chocolate. ? Alcohol. ? Diet pills. Lifestyle   Try to lower your stress. These things can help you relax: ? Yoga. ? Deep breathing and meditation. ? Exercise. ? Using words and images to create positive thoughts (guided imagery). ? Using your mind to control things in your body (biofeedback).  Do not use drugs.  Get plenty of rest and sleep. Keep a regular bed time. General instructions   Take over-the-counter and prescription medicines only as told by your doctor.  Do not use any products that contain nicotine or tobacco, such as cigarettes and e-cigarettes. If you need help quitting, ask your doctor.  Keep all follow-up visits as told by your doctor. This is important. You may need more tests if palpitations do not go away or get worse. Contact a doctor if:  Your symptoms last more than 24 hours.  Your symptoms occur more often. Get help right away if you:  Have chest pain.  Feel short of breath.  Have a very bad headache.  Feel dizzy.  Pass out (faint). Summary  Palpitations are feelings that your heartbeat is uneven or faster than normal. It may feel like your heart is  fluttering or skipping a beat.  Avoid food and drinks that may cause palpitations. These include caffeine, chocolate, and alcohol.  Try to lower your stress. Do not smoke or use drugs.  Get help right away if you faint or have chest pain, shortness of breath, a severe headache, or dizziness. This information is not intended to replace advice given to you by your health care provider. Make sure you discuss any questions you have with your health care provider. Document Released: 05/10/2008 Document Revised: 09/13/2017 Document Reviewed: 09/13/2017 Elsevier Interactive Patient Education  2019 [employer].

## 2018-11-09 NOTE — Progress Notes (Signed)
    Subjective:  Grace Lyons is a 58 y.o. female who presents to the [facility] today with a chief complaint of palpitations.   HPI:  Patient states that she has had palpitations for the last 2 days.  It only occurs on exertion.  They are brief in duration.  She feels fluttering in her chest that is associated with some nausea.  She does not have any associated shortness of breath or diaphoresis.  It resolves with rest.  She does not have any chest pain with this. She has not had an increase in her caffeine or alcohol intake. She endorses some stress and anxiety but nothing unusual for the current times. She has not had any unexplained weight loss or hair/skin/nail changes. She has not had any presyncope or syncope.  ROS: Per HPI   Objective:  Physical Exam: BP 114/64   Pulse 71   Temp 98.5 F (36.9 C) (Oral)   Wt 179 lb (81.2 kg)   LMP 08/14/2012   SpO2 95%   BMI 32.74 kg/m   Gen: NAD, resting comfortably CV: RRR with no murmurs appreciated Pulm: NWOB, CTAB with no crackles, wheezes, or rhonchi GI: Normal bowel sounds present. Soft, Nontender, Nondistended. MSK: no edema, cyanosis, or clubbing noted Skin: warm, dry Neuro: grossly normal, moves all extremities Psych: Normal affect and thought content  Assessment/Plan:  Palpitations Patient is currently asymptomatic today.  EKG performed which showed sinus rhythm with no changes from previous. Check CBC, BMP, TSH.  Reviewed return precautions again.  Patient voiced good understanding that should she have chest pain, syncope, associated shortness of breath that she should go the ER for evaluation.   Bufford Lope, DO PGY-3, [facility] 11/09/2018 9:29 AM

## 2018-11-10 LAB — CBC
Hematocrit: 37.9 % (ref 34.0–46.6)
Hemoglobin: 13.2 g/dL (ref 11.1–15.9)
MCH: 32.4 pg (ref 26.6–33.0)
MCHC: 34.8 g/dL (ref 31.5–35.7)
MCV: 93 fL (ref 79–97)
Platelets: 242 10*3/uL (ref 150–450)
RBC: 4.07 x10E6/uL (ref 3.77–5.28)
RDW: 12.8 % (ref 11.7–15.4)
WBC: 4.7 10*3/uL (ref 3.4–10.8)

## 2018-11-10 LAB — BASIC METABOLIC PANEL
BUN/Creatinine Ratio: 10 (ref 9–23)
BUN: 8 mg/dL (ref 6–24)
CO2: 25 mmol/L (ref 20–29)
Calcium: 10.7 mg/dL — ABNORMAL HIGH (ref 8.7–10.2)
Chloride: 101 mmol/L (ref 96–106)
Creatinine, Ser: 0.8 mg/dL (ref 0.57–1.00)
GFR calc Af Amer: 94 mL/min/{1.73_m2} (ref >59)
GFR calc non Af Amer: 82 mL/min/{1.73_m2} (ref >59)
Glucose: 88 mg/dL (ref 65–99)
Potassium: 4.3 mmol/L (ref 3.5–5.2)
Sodium: 140 mmol/L (ref 134–144)

## 2018-11-10 LAB — TSH: TSH: 0.597 u[IU]/mL (ref 0.450–4.500)

## 2018-11-12 ENCOUNTER — Encounter: Payer: Self-pay | Admitting: Family Medicine

## 2018-12-06 ENCOUNTER — Other Ambulatory Visit: Payer: Self-pay | Admitting: Family Medicine

## 2018-12-06 DIAGNOSIS — Z1231 Encounter for screening mammogram for malignant neoplasm of breast: Secondary | ICD-10-CM

## 2019-01-08 ENCOUNTER — Ambulatory Visit (INDEPENDENT_AMBULATORY_CARE_PROVIDER_SITE_OTHER)
Admission: RE | Admit: 2019-01-08 | Discharge: 2019-01-08 | Disposition: A | Discharge status: Home / Self Care | Payer: BLUE CROSS/BLUE SHIELD | Source: Ambulatory Visit | Attending: Family Medicine | Admitting: Family Medicine

## 2019-01-08 ENCOUNTER — Encounter: Payer: Self-pay | Admitting: Family Medicine

## 2019-01-08 ENCOUNTER — Other Ambulatory Visit: Payer: Self-pay

## 2019-01-08 ENCOUNTER — Ambulatory Visit (INDEPENDENT_AMBULATORY_CARE_PROVIDER_SITE_OTHER): Payer: BLUE CROSS/BLUE SHIELD | Admitting: Family Medicine

## 2019-01-08 VITALS — BP 140/80 | HR 80 | Ht 62.0 in | Wt 181.0 lb

## 2019-01-08 DIAGNOSIS — M25561 Pain in right knee: Secondary | ICD-10-CM

## 2019-01-08 DIAGNOSIS — G8929 Other chronic pain: Secondary | ICD-10-CM

## 2019-01-08 DIAGNOSIS — M25361 Other instability, right knee: Secondary | ICD-10-CM

## 2019-01-08 DIAGNOSIS — M1711 Unilateral primary osteoarthritis, right knee: Secondary | ICD-10-CM | POA: Diagnosis not present

## 2019-01-08 NOTE — Assessment & Plan Note (Signed)
Patient is significant instability of the knee.  Did have swelling wanted injection for the underlying arthritis.  Concern for an acute tear of the ACL.  Patient did do well though with arthroscopic procedure on the contralateral knee.  Discussed with patient in great length.  Patient would like an MRI because this could change surgical intervention.  Patient could be a candidate for a ACL repair depending on the amount of arthritis that is accumulated.  After the MRI we will follow-up and discuss medical management.

## 2019-01-08 NOTE — Progress Notes (Addendum)
[name] [address], [place] [address] Phone: [phone] Subjective:   I Grace Lyons am serving as a Education administrator for Dr. Hulan Saas.   CC: Bilateral knee pain  MOQ:[id number]   03/27/2019 Patient had aspiration done.  Responded well.  Discussed icing regimen and home exercises.  Discussed compression, patient is going to try conservative therapy and follow-up with me again in 4 to 6 weeks  5/26/20220 Grace Lyons is a 58 y.o. female coming in with complaint of knee pain. Right knee. Wears a sleeve that helps sometimes. States that it feels like fluid is in her knee.   Onset- Chronic  Location- lateral Character-dull, throbbing aching sensation. Aggravating factors- walking  Severity-5 out of 10     Past Medical History:  Diagnosis Date  . Cervical polyp   . Fibroid   . H/O left knee surgery 09/2017  . Menometrorrhagia   . Vitamin D deficiency    Past Surgical History:  Procedure Laterality Date  . COMBINED HYSTEROSCOPY DIAGNOSTIC / D&C    . ENDOMETRIAL BIOPSY  12/17/2008  . HYSTEROSCOPY W/D&C  08/31/2012   Procedure: DILATATION AND CURETTAGE /HYSTEROSCOPY;  Surgeon: Delice Lesch, MD;  Location: [facility];  Service: Gynecology;  Laterality: N/A;  with resectoscope  . KNEE SURGERY    . TUBAL LIGATION  1995   Social History   Socioeconomic History  . Marital status: Single    Spouse name: Not on file  . Number of children: Not on file  . Years of education: Not on file  . Highest education level: Not on file  Occupational History    Employer: [employer]  Social Needs  . Financial resource strain: Not on file  . Food insecurity:    Worry: Not on file    Inability: Not on file  . Transportation needs:    Medical: Not on file    Non-medical: Not on file  Tobacco Use  . Smoking status: Former Smoker    Last attempt to quit: 01/12/1996    Years since quitting: 23.0  . Smokeless tobacco: Never Used  .  Tobacco comment: Single, lives alone. Works as Licensed conveyancer at [employer]. Has 56yo son at [place]  Substance and Sexual Activity  . Alcohol use: No  . Drug use: No  . Sexual activity: Not Currently    Birth control/protection: Surgical    Comment: BTL  Lifestyle  . Physical activity:    Days per week: Not on file    Minutes per session: Not on file  . Stress: Not on file  Relationships  . Social connections:    Talks on phone: Not on file    Gets together: Not on file    Attends religious service: Not on file    Active member of club or organization: Not on file    Attends meetings of clubs or organizations: Not on file    Relationship status: Not on file  Other Topics Concern  . Not on file  Social History Narrative  . Not on file   Allergies  Allergen Reactions  . Doxycycline Rash   Family History  Problem Relation Age of Onset  . Diabetes Mother   . Hypertension Mother   . Heart disease Mother   . Diabetes Father   . Hypertension Father   . Prostate cancer Father        Current Outpatient Medications (Analgesics):  .  meloxicam (MOBIC) 15 MG tablet, Take 1 tablet (15 mg total) by mouth daily.   Current Outpatient Medications (Other):  Marland Kitchen  Cholecalciferol (VITAMIN D3) 1000 UNITS CAPS, Take 1,000 Units by mouth daily.  .  cyclobenzaprine (FLEXERIL) 10 MG tablet, Take 1 tablet (10 mg total) by mouth 3 (three) times daily as needed for muscle spasms. .  Omega-3 Fatty Acids (OMEGA 3 PO), Take 1 capsule by mouth daily.  Marland Kitchen  VITAMIN A PO, Take by mouth.    Past medical history, social, surgical and family history all reviewed in electronic medical record.  No pertanent information unless stated regarding to the chief complaint.   Review of Systems:  No headache, visual changes, nausea, vomiting, diarrhea, constipation, dizziness, abdominal pain, skin rash, fevers, chills, night sweats, weight loss, swollen lymph nodes, body aches, joint  swelling,  chest pain, shortness of breath, mood changes.  Positive muscle aches  Objective  Blood pressure 140/80, pulse 80, height 5\' 2"  (1.575 m), weight 181 lb (82.1 kg), last menstrual period 08/14/2012, SpO2 98 %.    General: No apparent distress alert and oriented x3 mood and affect normal, dressed appropriately.  HEENT: Pupils equal, extraocular movements intact  Respiratory: Patient's speak in full sentences and does not appear short of breath  Cardiovascular: No lower extremity edema, non tender, no erythema  Skin: Warm dry intact with no signs of infection or rash on extremities or on axial skeleton.  Abdomen: Soft nontender  Neuro: Cranial nerves II through XII are intact, neurovascularly intact in all extremities with 2+ DTRs and 2+ pulses.  Lymph: No lymphadenopathy of posterior or anterior cervical chain or axillae bilaterally.  Gait severely antalgic MSK:  Non tender with full range of motion and good stability and symmetric strength and tone of shoulders, elbows, wrist, hip, and ankles bilaterally.   Knee: Right valgus deformity noted.  Abnormal thigh to calf ratio.  Tender to palpation over medial and PF joint line.  Limited range of motion severe instability of the ACL instability with valgus force.  painful patellar compression. Patellar glide with moderate crepitus. Patellar and quadriceps tendons unremarkable. Hamstring and quadriceps strength is normal. Contralateral knee shows arthritic changes but no instability  After informed written and verbal consent, patient was seated on exam table. Right knee was prepped with alcohol swab and utilizing anterolateral approach, patient's right knee space was injected with 4:1  marcaine 0.5%: Kenalog 40mg /dL. Patient tolerated the procedure well without immediate complications.    Impression and Recommendations:     This case required medical decision making of moderate complexity. The above documentation has been  reviewed and is accurate and complete Lyndal Pulley, DO       Note: This dictation was prepared with Dragon dictation along with smaller phrase technology. Any transcriptional errors that result from this process are unintentional.

## 2019-01-10 ENCOUNTER — Other Ambulatory Visit: Payer: Self-pay | Admitting: Family Medicine

## 2019-01-10 ENCOUNTER — Other Ambulatory Visit: Payer: Self-pay | Admitting: Orthopedic Surgery

## 2019-01-11 ENCOUNTER — Ambulatory Visit
Admission: RE | Admit: 2019-01-11 | Discharge: 2019-01-11 | Disposition: A | Discharge status: Home / Self Care | Payer: BLUE CROSS/BLUE SHIELD | Source: Ambulatory Visit | Attending: Family Medicine | Admitting: Family Medicine

## 2019-01-11 ENCOUNTER — Other Ambulatory Visit: Payer: Self-pay

## 2019-01-11 DIAGNOSIS — G8929 Other chronic pain: Secondary | ICD-10-CM

## 2019-01-11 DIAGNOSIS — M25561 Pain in right knee: Secondary | ICD-10-CM | POA: Diagnosis not present

## 2019-01-13 ENCOUNTER — Ambulatory Visit (INDEPENDENT_AMBULATORY_CARE_PROVIDER_SITE_OTHER): Payer: BLUE CROSS/BLUE SHIELD

## 2019-01-13 ENCOUNTER — Ambulatory Visit (HOSPITAL_COMMUNITY)
Admission: EM | Admit: 2019-01-13 | Discharge: 2019-01-13 | Disposition: A | Discharge status: Home / Self Care | Payer: BLUE CROSS/BLUE SHIELD | Attending: Family Medicine | Admitting: Family Medicine

## 2019-01-13 ENCOUNTER — Other Ambulatory Visit: Payer: Self-pay

## 2019-01-13 ENCOUNTER — Encounter (HOSPITAL_COMMUNITY): Payer: Self-pay | Admitting: Family Medicine

## 2019-01-13 DIAGNOSIS — S93601A Unspecified sprain of right foot, initial encounter: Secondary | ICD-10-CM

## 2019-01-13 DIAGNOSIS — M79671 Pain in right foot: Secondary | ICD-10-CM | POA: Diagnosis not present

## 2019-01-13 DIAGNOSIS — S99921A Unspecified injury of right foot, initial encounter: Secondary | ICD-10-CM | POA: Diagnosis not present

## 2019-01-13 MED ORDER — IBUPROFEN 800 MG PO TABS: 800.0000 mg | ORAL_TABLET | Freq: Three times a day (TID) | ORAL | 0 refills | Status: DC

## 2019-01-13 NOTE — Discharge Instructions (Addendum)
Your x-rays are negative.

## 2019-01-13 NOTE — ED Triage Notes (Signed)
Per pt she has a bad knee and it gave out on her and she twisted her ankle on Saturday. Hard to walk on and had some swelling.

## 2019-01-13 NOTE — ED Provider Notes (Signed)
[place]    CSN: [id number] Arrival date & time: 01/13/19  0957     History   Chief Complaint Chief Complaint  Patient presents with  . Knee Pain    HPI Grace Lyons is a 58 y.o. female.   Established Kim patient.  Per pt she has a bad knee and it gave out on her and she twisted her ankle on Saturday. Hard to walk on and had some swelling.    At first, she was able to bear weight, but then after several hours, the foot began to throb quite a bit and she had trouble sleeping last night.  Patient is seeing Hulan Saas re: the knees.  She recently had an MRI and cortisone shot in the knee.       Past Medical History:  Diagnosis Date  . Cervical polyp   . Fibroid   . H/O left knee surgery 09/2017  . Menometrorrhagia   . Vitamin D deficiency     Patient Active Problem List   Diagnosis Date Noted  . Instability of right knee joint 01/08/2019  . Palpitations 11/09/2018  . Menometrorrhagia 03/13/2018  . Polyp of cervix 03/13/2018  . Refuses statin 03/13/2018  . H/O arthroscopy 10/20/2017  . Instability of left knee joint 07/05/2017  . Degenerative arthritis of knee, bilateral 12/09/2015  . Other bilateral secondary osteoarthritis of knee 11/26/2014  . Pes planus of both feet 06/20/2014  . Loss of transverse plantar arch 06/20/2014  . Knee pain, left 12/29/2010  . Type 2 diabetes mellitus without complication, without long-term current use of insulin ([place]) 12/23/2009  . Vitamin D deficiency 12/23/2009    Past Surgical History:  Procedure Laterality Date  . COMBINED HYSTEROSCOPY DIAGNOSTIC / D&C    . ENDOMETRIAL BIOPSY  12/17/2008  . HYSTEROSCOPY W/D&C  08/31/2012   Procedure: DILATATION AND CURETTAGE /HYSTEROSCOPY;  Surgeon: Delice Lesch, MD;  Location: [facility];  Service: Gynecology;  Laterality: N/A;  with resectoscope  . KNEE SURGERY    . TUBAL LIGATION  1995    OB History    Gravida  1   Para      Term      Preterm      AB      Living  1     SAB      TAB      Ectopic      Multiple      Live Births               Home Medications    Prior to Admission medications   Medication Sig Start Date End Date Taking? Authorizing Provider  Cholecalciferol (VITAMIN D3) 1000 UNITS CAPS Take 1,000 Units by mouth daily.     [provider]  ibuprofen (ADVIL) 800 MG tablet Take 1 tablet (800 mg total) by mouth 3 (three) times daily. 01/13/19   Robyn Haber, MD  Omega-3 Fatty Acids (OMEGA 3 PO) Take 1 capsule by mouth daily.     [provider]  VITAMIN A PO Take by mouth.    [provider]    Family History Family History  Problem Relation Age of Onset  . Diabetes Mother   . Hypertension Mother   . Heart disease Mother   . Diabetes Father   . Hypertension Father   . Prostate cancer Father     Social History Social History   Tobacco Use  . Smoking status: Former Smoker    Last attempt  to quit: 01/12/1996    Years since quitting: 23.0  . Smokeless tobacco: Never Used  . Tobacco comment: Single, lives alone. Works as Licensed conveyancer at [employer]. Has 85yo son at [place]  Substance Use Topics  . Alcohol use: No  . Drug use: No     Allergies   Doxycycline   Review of Systems Review of Systems  Musculoskeletal: Positive for gait problem and joint swelling.  All other systems reviewed and are negative.    Physical Exam Triage Vital Signs ED Triage Vitals  Enc Vitals Group     BP      Pulse      Resp      Temp      Temp src      SpO2      Weight      Height      Head Circumference      Peak Flow      Pain Score      Pain Loc      Pain Edu?      Excl. in [place]?    No data found.  Updated Vital Signs BP 137/90 (BP Location: Right Arm)   Pulse 79   Resp 16   LMP 08/14/2012   SpO2 97%    Physical Exam Vitals signs and nursing note reviewed.  Constitutional:      Appearance: Normal appearance. She is obese.  HENT:     Head:  Normocephalic.  Eyes:     Conjunctiva/sclera: Conjunctivae normal.  Neck:     Musculoskeletal: Normal range of motion and neck supple.  Pulmonary:     Effort: Pulmonary effort is normal.  Musculoskeletal:        General: Swelling, tenderness and signs of injury present.     Comments: Tender proximal right fifth metatarsal.  Neurological:     General: No focal deficit present.     Mental Status: She is alert.  Psychiatric:        Mood and Affect: Mood normal.        Behavior: Behavior normal.        Thought Content: Thought content normal.        Judgment: Judgment normal.      UC Treatments / Results  Labs (all labs ordered are listed, but only abnormal results are displayed) Labs Reviewed - No data to display  EKG None  Radiology No results found.  Procedures Procedures (including critical care time)  Medications Ordered in UC Medications - No data to display  Initial Impression / Assessment and Plan / UC Course  I have reviewed the triage vital signs and the nursing notes.  Pertinent labs & imaging results that were available during my care of the patient were reviewed by me and considered in my medical decision making (see chart for details).    Final Clinical Impressions(s) / UC Diagnoses   Final diagnoses:  Foot sprain, right, initial encounter     Discharge Instructions     Your x-rays are negative.    ED Prescriptions    Medication Sig Dispense Auth. Provider   ibuprofen (ADVIL) 800 MG tablet Take 1 tablet (800 mg total) by mouth 3 (three) times daily. 21 tablet Robyn Haber, MD     Controlled Substance Prescriptions [place] Controlled Substance Registry consulted? Not Applicable   Robyn Haber, MD 01/13/19 1042

## 2019-01-30 ENCOUNTER — Encounter: Payer: BLUE CROSS/BLUE SHIELD | Admitting: Family Medicine

## 2019-02-04 ENCOUNTER — Ambulatory Visit
Admission: RE | Admit: 2019-02-04 | Discharge: 2019-02-04 | Disposition: A | Discharge status: Home / Self Care | Payer: No Typology Code available for payment source | Source: Ambulatory Visit | Attending: *Deleted | Admitting: *Deleted

## 2019-02-04 DIAGNOSIS — Z1231 Encounter for screening mammogram for malignant neoplasm of breast: Secondary | ICD-10-CM

## 2019-02-06 ENCOUNTER — Other Ambulatory Visit: Payer: Self-pay | Admitting: Family Medicine

## 2019-02-06 DIAGNOSIS — R928 Other abnormal and inconclusive findings on diagnostic imaging of breast: Secondary | ICD-10-CM

## 2019-02-07 ENCOUNTER — Other Ambulatory Visit: Payer: No Typology Code available for payment source

## 2019-02-20 ENCOUNTER — Other Ambulatory Visit: Payer: Self-pay

## 2019-02-20 ENCOUNTER — Ambulatory Visit
Admission: RE | Admit: 2019-02-20 | Discharge: 2019-02-20 | Disposition: A | Discharge status: Home / Self Care | Payer: No Typology Code available for payment source | Source: Ambulatory Visit | Attending: *Deleted | Admitting: *Deleted

## 2019-02-20 ENCOUNTER — Ambulatory Visit
Admission: RE | Admit: 2019-02-20 | Discharge: 2019-02-20 | Disposition: A | Discharge status: Home / Self Care | Payer: BC Managed Care – PPO | Source: Ambulatory Visit | Attending: *Deleted | Admitting: *Deleted

## 2019-02-20 ENCOUNTER — Other Ambulatory Visit: Payer: Self-pay | Admitting: Family Medicine

## 2019-02-20 DIAGNOSIS — R928 Other abnormal and inconclusive findings on diagnostic imaging of breast: Secondary | ICD-10-CM

## 2019-02-20 DIAGNOSIS — N63 Unspecified lump in unspecified breast: Secondary | ICD-10-CM

## 2019-02-20 DIAGNOSIS — N6311 Unspecified lump in the right breast, upper outer quadrant: Secondary | ICD-10-CM | POA: Diagnosis not present

## 2019-02-25 ENCOUNTER — Other Ambulatory Visit: Payer: Self-pay

## 2019-02-25 ENCOUNTER — Ambulatory Visit (INDEPENDENT_AMBULATORY_CARE_PROVIDER_SITE_OTHER): Payer: BC Managed Care – PPO | Admitting: Family Medicine

## 2019-02-25 ENCOUNTER — Encounter: Payer: Self-pay | Admitting: Family Medicine

## 2019-02-25 VITALS — BP 122/70 | HR 77 | Ht 62.0 in | Wt 177.5 lb

## 2019-02-25 DIAGNOSIS — Z0000 Encounter for general adult medical examination without abnormal findings: Secondary | ICD-10-CM | POA: Diagnosis not present

## 2019-02-25 DIAGNOSIS — Z23 Encounter for immunization: Secondary | ICD-10-CM

## 2019-02-25 DIAGNOSIS — E119 Type 2 diabetes mellitus without complications: Secondary | ICD-10-CM | POA: Diagnosis not present

## 2019-02-25 LAB — POCT GLYCOSYLATED HEMOGLOBIN (HGB A1C): HbA1c, POC (controlled diabetic range): 6.1 % (ref 0.0–7.0)

## 2019-02-25 MED ORDER — MICONAZOLE NITRATE 2 % EX CREA: 1.0000 "application " | TOPICAL_CREAM | Freq: Two times a day (BID) | CUTANEOUS | 0 refills | Status: DC

## 2019-02-25 NOTE — Progress Notes (Signed)
Date of Visit: 02/25/2019   HPI:  Patient presents today for a well woman exam.   Concerns today: None Periods: LMP 7 to 8 years ago Contraception: N/A Pelvic symptoms: N/A Pap smear status: Patient to schedule Exercise: Unable to exercise regularly due to knee pain Diet: Patient reports that her diet has gotten worse after being at home but that she is still attempting to have a healthy diet for her diabetes Smoking: No; Alcohol: Reports 1 bottle of wine will last her for the month; Drugs: None Mood: Has been feeling more down since COVID-19 given that she was furloughed and has been unable to find a new job.  She is interested in speaking with her social worker regarding any possible resources in order to find a new job.  ROS: See HPI  [place]:  Cancers in family: Father with prostate cancer, mother with diabetes, hypertension and heart disease.  Father with diabetes and hypertension  PHYSICAL EXAM: LMP 08/14/2012  Gen: NAD, pleasant, cooperative HEENT: NCAT, PERRL, no palpable thyromegaly or anterior cervical lymphadenopathy Heart: RRR, no murmurs Lungs: CTAB, NWOB Abdomen: soft, nontender to palpation Neuro: grossly nonfocal, speech normal   Diabetic Foot Exam - Simple   Simple Foot Form Visual Inspection No deformities, no ulcerations, no other skin breakdown bilaterally: Yes See comments: Yes Sensation Testing Intact to touch and monofilament testing bilaterally: Yes Pulse Check Posterior Tibialis and Dorsalis pulse intact bilaterally: Yes Comments Patient has small area of skin breakdown between great toe and second toe on left foot.  She reports that she has had this before and used some sort of antifungal cream in order to help with the area.  She has been keeping it dry.  It hears to be well-healing at this time.     ASSESSMENT/PLAN:  # Health maintenance:  -STD screening: Patient declined -pap smear: Patient to schedule appointment for Pap smear; abnormal Pap  in 2013 with ASCUS, normal Pap in 2017. -mammogram: Recent mammogram with ultrasound -lipid screening: We will performed today -immunizations: Patient had Tdap and pneumonia vaccine today -advance directives: Not interested in discussing -handout given on health maintenance topics  Grace Lyons was seen today for annual exam.  Diagnoses and all orders for this visit:  Type 2 diabetes mellitus without complication, without long-term current use of insulin (HCC) -     HgB A1c -     Lipid panel -     Basic metabolic panel -     Cancel: POCT UA - Microalbumin -     Pneumococcal polysaccharide vaccine 23-valent greater than or equal to 2yo subcutaneous/IM -     Tdap vaccine greater than or equal to 7yo IM -     Microalbumin / creatinine urine ratio  Other orders -     miconazole (MICATIN) 2 % cream; Apply 1 application topically 2 (two) times daily.  T2DM diet controlled.  FOLLOW UP: Follow up in 6 months for diabetes management  Martinique Paxton Binns, DO PGY-3, [facility]

## 2019-02-25 NOTE — Assessment & Plan Note (Signed)
A1c 6.1 today.  Patient diet controlled and is not interested in starting metformin or statin therapy again.  Given that she has been able to maintain her A1c at 6.1 believe that this is fair.  She is encouraged to continue focusing on control with diet and exercise.

## 2019-02-25 NOTE — Patient Instructions (Signed)
Thank you for coming to see me today. It was a pleasure! Today we talked about:   We have given you your tetanus and pneumonia vaccine today.  Please try to move your arm around as much as possible in order to reduce soreness.  You may also apply ice if it is quite painful.  We have tested your electrolytes and kidney function and will release these results on my chart along with your cholesterol levels.  I am happy to report that your A1c today is 6.1.  Please continue working on a good diet.   You will need to schedule your Pap smear within the next few months.  It is due in August 2020.   I have sent in a cream for the area between your big toe on the left.  Please try to keep this area dry and apply the cream as needed.  If you have any questions or concerns, please do not hesitate to call the office at [phone].  Take Care,   Grace Dennisha Mouser, DO  Preventive Care 58-74 Years Old, Female Preventive care refers to visits with your health care provider and lifestyle choices that can promote health and wellness. This includes:  A yearly physical exam. This may also be called an annual well check.  Regular dental visits and eye exams.  Immunizations.  Screening for certain conditions.  Healthy lifestyle choices, such as eating a healthy diet, getting regular exercise, not using drugs or products that contain nicotine and tobacco, and limiting alcohol use. What can I expect for my preventive care visit? Physical exam Your health care provider will check your:  Height and weight. This may be used to calculate body mass index (BMI), which tells if you are at a healthy weight.  Heart rate and blood pressure.  Skin for abnormal spots. Counseling Your health care provider may ask you questions about your:  Alcohol, tobacco, and drug use.  Emotional well-being.  Home and relationship well-being.  Sexual activity.  Eating habits.  Work and work Statistician.  Method  of birth control.  Menstrual cycle.  Pregnancy history. What immunizations do I need?  Influenza (flu) vaccine  This is recommended every year. Tetanus, diphtheria, and pertussis (Tdap) vaccine  You may need a Td booster every 10 years. Varicella (chickenpox) vaccine  You may need this if you have not been vaccinated. Zoster (shingles) vaccine  You may need this after age 58. Measles, mumps, and rubella (MMR) vaccine  You may need at least one dose of MMR if you were born in 1957 or later. You may also need a second dose. Pneumococcal conjugate (PCV13) vaccine  You may need this if you have certain conditions and were not previously vaccinated. Pneumococcal polysaccharide (PPSV23) vaccine  You may need one or two doses if you smoke cigarettes or if you have certain conditions. Meningococcal conjugate (MenACWY) vaccine  You may need this if you have certain conditions. Hepatitis A vaccine  You may need this if you have certain conditions or if you travel or work in places where you may be exposed to hepatitis A. Hepatitis B vaccine  You may need this if you have certain conditions or if you travel or work in places where you may be exposed to hepatitis B. Haemophilus influenzae type b (Hib) vaccine  You may need this if you have certain conditions. Human papillomavirus (HPV) vaccine  If recommended by your health care provider, you may need three doses over 6 months. You may  receive vaccines as individual doses or as more than one vaccine together in one shot (combination vaccines). Talk with your health care provider about the risks and benefits of combination vaccines. What tests do I need? Blood tests  Lipid and cholesterol levels. These may be checked every 5 years, or more frequently if you are over 45 years old.  Hepatitis C test.  Hepatitis B test. Screening  Lung cancer screening. You may have this screening every year starting at age 23 if you have a  30-pack-year history of smoking and currently smoke or have quit within the past 15 years.  Colorectal cancer screening. All adults should have this screening starting at age 58 and continuing until age 70. Your health care provider may recommend screening at age 71 if you are at increased risk. You will have tests every 1-10 years, depending on your results and the type of screening test.  Diabetes screening. This is done by checking your blood sugar (glucose) after you have not eaten for a while (fasting). You may have this done every 1-3 years.  Mammogram. This may be done every 1-2 years. Talk with your health care provider about when you should start having regular mammograms. This may depend on whether you have a family history of breast cancer.  BRCA-related cancer screening. This may be done if you have a family history of breast, ovarian, tubal, or peritoneal cancers.  Pelvic exam and Pap test. This may be done every 3 years starting at age 1. Starting at age 28, this may be done every 5 years if you have a Pap test in combination with an HPV test. Other tests  Sexually transmitted disease (STD) testing.  Bone density scan. This is done to screen for osteoporosis. You may have this scan if you are at high risk for osteoporosis. Follow these instructions at home: Eating and drinking  Eat a diet that includes fresh fruits and vegetables, whole grains, lean protein, and low-fat dairy.  Take vitamin and mineral supplements as recommended by your health care provider.  Do not drink alcohol if: ? Your health care provider tells you not to drink. ? You are pregnant, may be pregnant, or are planning to become pregnant.  If you drink alcohol: ? Limit how much you have to 0-1 drink a day. ? Be aware of how much alcohol is in your drink. In the [place] one drink equals one 12 oz bottle of beer (355 mL), one 5 oz glass of wine (148 mL), or one 1 oz glass of hard liquor (44 mL). Lifestyle   Take daily care of your teeth and gums.  Stay active. Exercise for at least 30 minutes on 5 or more days each week.  Do not use any products that contain nicotine or tobacco, such as cigarettes, e-cigarettes, and chewing tobacco. If you need help quitting, ask your health care provider.  If you are sexually active, practice safe sex. Use a condom or other form of birth control (contraception) in order to prevent pregnancy and STIs (sexually transmitted infections).  If told by your health care provider, take low-dose aspirin daily starting at age 50. What's next?  Visit your health care provider once a year for a well check visit.  Ask your health care provider how often you should have your eyes and teeth checked.  Stay up to date on all vaccines. This information is not intended to replace advice given to you by your health care provider. Make sure you discuss  any questions you have with your health care provider. Document Released: 08/28/2015 Document Revised: 04/12/2018 Document Reviewed: 04/12/2018 Elsevier Patient Education  2020 [employer].

## 2019-02-26 LAB — BASIC METABOLIC PANEL
BUN/Creatinine Ratio: 14 (ref 9–23)
BUN: 11 mg/dL (ref 6–24)
CO2: 22 mmol/L (ref 20–29)
Calcium: 10.4 mg/dL — ABNORMAL HIGH (ref 8.7–10.2)
Chloride: 103 mmol/L (ref 96–106)
Creatinine, Ser: 0.81 mg/dL (ref 0.57–1.00)
GFR calc Af Amer: 93 mL/min/{1.73_m2} (ref >59)
GFR calc non Af Amer: 80 mL/min/{1.73_m2} (ref >59)
Glucose: 104 mg/dL — ABNORMAL HIGH (ref 65–99)
Potassium: 4.2 mmol/L (ref 3.5–5.2)
Sodium: 135 mmol/L (ref 134–144)

## 2019-02-26 LAB — LIPID PANEL
Chol/HDL Ratio: 2.4 ratio (ref 0.0–4.4)
Cholesterol, Total: 160 mg/dL (ref 100–199)
HDL: 68 mg/dL (ref >39)
LDL Calculated: 81 mg/dL (ref 0–99)
Triglycerides: 55 mg/dL (ref 0–149)
VLDL Cholesterol Cal: 11 mg/dL (ref 5–40)

## 2019-02-26 LAB — MICROALBUMIN / CREATININE URINE RATIO
Creatinine, Urine: 43.5 mg/dL
Microalb/Creat Ratio: <7 mg/g creat (ref 0–29)
Microalbumin, Urine: <3 ug/mL

## 2019-03-15 ENCOUNTER — Encounter: Payer: BLUE CROSS/BLUE SHIELD | Admitting: Family Medicine

## 2019-03-26 DIAGNOSIS — Z01419 Encounter for gynecological examination (general) (routine) without abnormal findings: Secondary | ICD-10-CM | POA: Diagnosis not present

## 2019-03-26 DIAGNOSIS — L282 Other prurigo: Secondary | ICD-10-CM | POA: Diagnosis not present

## 2019-03-26 DIAGNOSIS — R87610 Atypical squamous cells of undetermined significance on cytologic smear of cervix (ASC-US): Secondary | ICD-10-CM | POA: Diagnosis not present

## 2019-03-26 DIAGNOSIS — Z124 Encounter for screening for malignant neoplasm of cervix: Secondary | ICD-10-CM | POA: Diagnosis not present

## 2019-03-26 DIAGNOSIS — Z6832 Body mass index (BMI) 32.0-32.9, adult: Secondary | ICD-10-CM | POA: Diagnosis not present

## 2019-05-05 ENCOUNTER — Encounter (HOSPITAL_COMMUNITY): Payer: Self-pay

## 2019-05-05 ENCOUNTER — Emergency Department (HOSPITAL_COMMUNITY): Payer: BC Managed Care – PPO

## 2019-05-05 ENCOUNTER — Emergency Department (HOSPITAL_COMMUNITY)
Admission: EM | Admit: 2019-05-05 | Discharge: 2019-05-05 | Disposition: A | Discharge status: Home / Self Care | Payer: BC Managed Care – PPO | Attending: Emergency Medicine | Admitting: Emergency Medicine

## 2019-05-05 ENCOUNTER — Other Ambulatory Visit: Payer: Self-pay

## 2019-05-05 DIAGNOSIS — E119 Type 2 diabetes mellitus without complications: Secondary | ICD-10-CM | POA: Insufficient documentation

## 2019-05-05 DIAGNOSIS — N200 Calculus of kidney: Secondary | ICD-10-CM | POA: Diagnosis not present

## 2019-05-05 DIAGNOSIS — M5442 Lumbago with sciatica, left side: Secondary | ICD-10-CM | POA: Diagnosis not present

## 2019-05-05 DIAGNOSIS — Z87891 Personal history of nicotine dependence: Secondary | ICD-10-CM | POA: Diagnosis not present

## 2019-05-05 DIAGNOSIS — K5730 Diverticulosis of large intestine without perforation or abscess without bleeding: Secondary | ICD-10-CM | POA: Diagnosis not present

## 2019-05-05 DIAGNOSIS — R1032 Left lower quadrant pain: Secondary | ICD-10-CM | POA: Diagnosis not present

## 2019-05-05 DIAGNOSIS — M5440 Lumbago with sciatica, unspecified side: Secondary | ICD-10-CM

## 2019-05-05 LAB — COMPREHENSIVE METABOLIC PANEL
ALT: 24 U/L (ref 0–44)
AST: 23 U/L (ref 15–41)
Albumin: 4.2 g/dL (ref 3.5–5.0)
Alkaline Phosphatase: 65 U/L (ref 38–126)
Anion gap: 6 (ref 5–15)
BUN: 12 mg/dL (ref 6–20)
CO2: 22 mmol/L (ref 22–32)
Calcium: 10.1 mg/dL (ref 8.9–10.3)
Chloride: 108 mmol/L (ref 98–111)
Creatinine, Ser: 0.74 mg/dL (ref 0.44–1.00)
GFR calc Af Amer: >60 mL/min (ref >60)
GFR calc non Af Amer: >60 mL/min (ref >60)
Glucose, Bld: 114 mg/dL — ABNORMAL HIGH (ref 70–99)
Potassium: 4.1 mmol/L (ref 3.5–5.1)
Sodium: 136 mmol/L (ref 135–145)
Total Bilirubin: 1 mg/dL (ref 0.3–1.2)
Total Protein: 7.3 g/dL (ref 6.5–8.1)

## 2019-05-05 LAB — URINALYSIS, ROUTINE W REFLEX MICROSCOPIC
Bacteria, UA: NONE SEEN
Bilirubin Urine: NEGATIVE
Glucose, UA: NEGATIVE mg/dL
Ketones, ur: NEGATIVE mg/dL
Leukocytes,Ua: NEGATIVE
Nitrite: NEGATIVE
Protein, ur: NEGATIVE mg/dL
Specific Gravity, Urine: 1.014 (ref 1.005–1.030)
pH: 6 (ref 5.0–8.0)

## 2019-05-05 LAB — CBC
HCT: 41 % (ref 36.0–46.0)
Hemoglobin: 13.8 g/dL (ref 12.0–15.0)
MCH: 31.8 pg (ref 26.0–34.0)
MCHC: 33.7 g/dL (ref 30.0–36.0)
MCV: 94.5 fL (ref 80.0–100.0)
Platelets: 252 10*3/uL (ref 150–400)
RBC: 4.34 MIL/uL (ref 3.87–5.11)
RDW: 14.4 % (ref 11.5–15.5)
WBC: 8.2 10*3/uL (ref 4.0–10.5)
nRBC: 0 % (ref 0.0–0.2)

## 2019-05-05 LAB — I-STAT BETA HCG BLOOD, ED (MC, WL, AP ONLY): I-stat hCG, quantitative: <5 m[IU]/mL (ref <5)

## 2019-05-05 LAB — LIPASE, BLOOD: Lipase: 22 U/L (ref 11–51)

## 2019-05-05 MED ORDER — SODIUM CHLORIDE 0.9% FLUSH: 3.0000 mL | Freq: Once | INTRAVENOUS | Status: DC

## 2019-05-05 MED ORDER — LIDOCAINE 5 % EX PTCH: 1.0000 | MEDICATED_PATCH | CUTANEOUS | 0 refills | Status: AC

## 2019-05-05 MED ORDER — PREDNISONE 20 MG PO TABS: 40.0000 mg | ORAL_TABLET | Freq: Every day | ORAL | 0 refills | Status: AC

## 2019-05-05 MED ORDER — DIAZEPAM 5 MG PO TABS
5.0000 mg | ORAL_TABLET | Freq: Once | ORAL | Status: AC
  Administered 2019-05-05: 5 mg via ORAL
  Filled 2019-05-05: qty 1

## 2019-05-05 MED ORDER — ACETAMINOPHEN 500 MG PO TABS
1000.0000 mg | ORAL_TABLET | Freq: Once | ORAL | Status: AC
  Administered 2019-05-05: 1000 mg via ORAL
  Filled 2019-05-05: qty 2

## 2019-05-05 MED ORDER — OXYCODONE-ACETAMINOPHEN 5-325 MG PO TABS
1.0000 | ORAL_TABLET | Freq: Once | ORAL | Status: AC
  Administered 2019-05-05: 1 via ORAL
  Filled 2019-05-05: qty 1

## 2019-05-05 MED ORDER — KETOROLAC TROMETHAMINE 15 MG/ML IJ SOLN
15.0000 mg | Freq: Once | INTRAMUSCULAR | Status: AC
  Administered 2019-05-05: 15 mg via INTRAMUSCULAR
  Filled 2019-05-05: qty 1

## 2019-05-05 NOTE — ED Notes (Signed)
Patient transported to CT 

## 2019-05-05 NOTE — ED Triage Notes (Signed)
Patient complains of left flank and side pain x 1 day. Pain worse with any movement but denies injury

## 2019-05-05 NOTE — Discharge Instructions (Signed)
I have prescribed steroids, be advised this medication can cause insomnia, appetite changes.  I have also prescribe a lidocaine patch which can help with the pain, please apply this to your left lower back.  Please follow-up with PCP in 1 week for reevaluation of your symptoms.    If you experience any bowel or bladder incontinence, fever, worsening in your symptoms please return to the ED.

## 2019-05-05 NOTE — ED Provider Notes (Signed)
[facility]PART[facility] Provider Note   CSN: [id number] Arrival date & time: 05/05/19  0715     History   Chief Complaint No chief complaint on file.   HPI Grace Lyons is a 58 y.o. female.     58 y.o female with a PMH of diabetes currently not on any medication with an A1c does control at 6.1 presents to the ED with complaints of left-sided pain which began yesterday. Patient endorses a shooting sensation from her left flank onto her lumbar region. She reports this pain is worse with movement but better with lying flat on her abdomen. She has taken flexeril but has seen no improvement of her symptoms. She does endorses prior history of right sided sciatica but states this does not radiate her whole leg.  Patient is currently ambulating with a cane, she does not usually ambulate with a cane at baseline.  She denies any urinary symptoms, fevers, back surgeries.  No prior history of cancer, urinary retention or bowel incontinence.  The history is provided by the patient and medical records.    Past Medical History:  Diagnosis Date  . Cervical polyp   . Degenerative arthritis of knee, bilateral 12/09/2015   Injections 12/09/2015 Started Orthovisc left knee 02/03/2016   . Fibroid   . H/O left knee surgery 09/2017  . Knee pain, left 12/29/2010  . Menometrorrhagia   . Vitamin D deficiency     Patient Active Problem List   Diagnosis Date Noted  . Instability of right knee joint 01/08/2019  . Palpitations 11/09/2018  . Menometrorrhagia 03/13/2018  . Polyp of cervix 03/13/2018  . Refuses statin 03/13/2018  . H/O arthroscopy 10/20/2017  . Instability of left knee joint 07/05/2017  . Other bilateral secondary osteoarthritis of knee 11/26/2014  . Pes planus of both feet 06/20/2014  . Loss of transverse plantar arch 06/20/2014  . Type 2 diabetes mellitus without complication, without long-term current use of insulin ([place]) 12/23/2009  . Vitamin D  deficiency 12/23/2009    Past Surgical History:  Procedure Laterality Date  . COMBINED HYSTEROSCOPY DIAGNOSTIC / D&C    . ENDOMETRIAL BIOPSY  12/17/2008  . HYSTEROSCOPY W/D&C  08/31/2012   Procedure: DILATATION AND CURETTAGE /HYSTEROSCOPY;  Surgeon: Delice Lesch, MD;  Location: [facility];  Service: Gynecology;  Laterality: N/A;  with resectoscope  . KNEE SURGERY    . TUBAL LIGATION  1995     OB History    Gravida  1   Para      Term      Preterm      AB      Living  1     SAB      TAB      Ectopic      Multiple      Live Births               Home Medications    Prior to Admission medications   Medication Sig Start Date End Date Taking? Authorizing Provider  Cholecalciferol (VITAMIN D3) 1000 UNITS CAPS Take 1,000 Units by mouth daily.     [provider]  ibuprofen (ADVIL) 800 MG tablet Take 1 tablet (800 mg total) by mouth 3 (three) times daily. 01/13/19   Robyn Haber, MD  lidocaine (LIDODERM) 5 % Place 1 patch onto the skin daily for 5 days. Remove & Discard patch within 12 hours or as directed by MD 05/05/19 05/10/19  Janeece Fitting, PA-C  miconazole (  MICATIN) 2 % cream Apply 1 application topically 2 (two) times daily. 02/25/19   Shirley, [place], DO  Omega-3 Fatty Acids (OMEGA 3 PO) Take 1 capsule by mouth daily.     [provider]  predniSONE (DELTASONE) 20 MG tablet Take 2 tablets (40 mg total) by mouth daily for 5 days. 05/05/19 05/10/19  Janeece Fitting, PA-C  VITAMIN A PO Take by mouth.    [provider]    Family History Family History  Problem Relation Age of Onset  . Diabetes Mother   . Hypertension Mother   . Heart disease Mother   . Diabetes Father   . Hypertension Father   . Prostate cancer Father     Social History Social History   Tobacco Use  . Smoking status: Former Smoker    Quit date: 01/12/1996    Years since quitting: 23.3  . Smokeless tobacco: Never Used  . Tobacco comment: Single, lives alone. 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 Alcohol use: No  . Drug use: No     Allergies   Doxycycline   Review of Systems Review of Systems  Constitutional: Negative for chills and fever.  HENT: Negative for ear pain and sore throat.   Eyes: Negative for pain and visual disturbance.  Respiratory: Negative for cough and shortness of breath.   Cardiovascular: Negative for chest pain and palpitations.  Gastrointestinal: Negative for abdominal pain, anal bleeding, constipation, nausea and vomiting.  Genitourinary: Negative for decreased urine volume, difficulty urinating, dysuria, flank pain, hematuria and urgency.  Musculoskeletal: Positive for back pain and myalgias. Negative for arthralgias.  Skin: Negative for color change and rash.  Neurological: Negative for seizures and syncope.  All other systems reviewed and are negative.    Physical Exam Updated Vital Signs BP (!) 155/92 (BP Location: Right Arm)   Pulse 90   Temp 98.5 F (36.9 C) (Oral)   Resp 16   Ht 5\' 2"  (1.575 m)   Wt 80.7 kg   LMP 08/14/2012   SpO2 98%   BMI 32.56 kg/m   Physical Exam Vitals signs and nursing note reviewed.  Constitutional:      General: She is in acute distress.     Appearance: She is well-developed. She is not ill-appearing.     Comments: Appears very uncomfortable lying on her abdomen.   HENT:     Head: Normocephalic and atraumatic.     Mouth/Throat:     Pharynx: No oropharyngeal exudate.  Eyes:     Pupils: Pupils are equal, round, and reactive to light.  Neck:     Musculoskeletal: Normal range of motion.  Cardiovascular:     Rate and Rhythm: Normal rate and regular rhythm.     Heart sounds: Normal heart sounds.  Pulmonary:     Effort: Pulmonary effort is normal. No respiratory distress.     Breath sounds: Normal breath sounds.     Comments: Soft, non tender to palpation. No CVA BL.  Abdominal:     General: Bowel  sounds are normal. There is no distension.     Palpations: Abdomen is soft.     Tenderness: There is no abdominal tenderness.  Musculoskeletal:        General: No deformity.     Lumbar back: She exhibits tenderness, bony tenderness, laceration, pain and spasm. She exhibits normal range of motion, no swelling, no edema, no deformity and  normal pulse.       Back:     Right lower leg: No edema.     Left lower leg: No edema.     Comments: RLE- KF,KE 5/5 strength LLE- HF, HE 5/5 strength Normal gait. No pronator drift. No leg drop.  Patellar reflexes present and symmetric. CN I, II and VIII not tested. CN II-XII grossly intact bilaterally.     Skin:    General: Skin is warm and dry.  Neurological:     Mental Status: She is alert and oriented to person, place, and time.      ED Treatments / Results  Labs (all labs ordered are listed, but only abnormal results are displayed) Labs Reviewed  COMPREHENSIVE METABOLIC PANEL - Abnormal; Notable for the following components:      Result Value   Glucose, Bld 114 (*)    All other components within normal limits  URINALYSIS, ROUTINE W REFLEX MICROSCOPIC - Abnormal; Notable for the following components:   Hgb urine dipstick MODERATE (*)    All other components within normal limits  LIPASE, BLOOD  CBC  I-STAT BETA HCG BLOOD, ED ([facility], [facility], [facility] ONLY)    EKG None  Radiology Ct Renal Stone Study  Result Date: 05/05/2019 CLINICAL DATA:  LEFT flank pain and LEFT-sided pain for 1 day. EXAM: CT ABDOMEN AND PELVIS WITHOUT CONTRAST TECHNIQUE: Multidetector CT imaging of the abdomen and pelvis was performed following the standard protocol without IV contrast. COMPARISON:  None. FINDINGS: Lower chest: No acute abnormality. Hepatobiliary: No focal liver abnormality is seen. No gallstones, gallbladder wall thickening, or biliary dilatation. Pancreas: Unremarkable. No pancreatic ductal dilatation or surrounding inflammatory changes. Spleen: Normal in size  without focal abnormality. Adrenals/Urinary Tract: Adrenal glands appear normal. 2 mm nonobstructing stone within the lower pole of the RIGHT renal pelvis. Kidneys otherwise unremarkable without mass or hydronephrosis. No perinephric fluid. No ureteral or bladder calculi identified. Bladder appears normal, partially w decompressed. Stomach/Bowel: No dilated large or small bowel loops. Scattered mild diverticulosis of the LEFT colon but no focal inflammatory change to suggest acute diverticulitis. No evidence of bowel wall inflammation. Stomach is unremarkable, partially decompressed. Appendix is normal. Vascular/Lymphatic: No significant vascular findings are present. No enlarged abdominal or pelvic lymph nodes. Reproductive: Uterus and bilateral adnexa are unremarkable. Other: No free fluid or abscess collection. No free intraperitoneal air. Musculoskeletal: No acute or suspicious osseous finding. Degenerative spondylosis at the lumbosacral junction, moderate in degree resulting in moderate central canal stenosis and possible associated nerve root impingement. IMPRESSION: 1. No acute findings within the abdomen or pelvis. No bowel obstruction or evidence of bowel wall inflammation. Appendix is normal. 2. 2 mm nonobstructing stone within the lower pole of the RIGHT renal pelvis. No ureteral or bladder calculi. No hydronephrosis. 3. Mild colonic diverticulosis without evidence of acute diverticulitis. 4. Degenerative spondylosis at the lumbosacral junction, moderate in degree with associated disc-osteophytic bulge causing moderate central canal stenosis and possible associated nerve root impingement. If any radiculopathic symptoms, would consider nonemergent lumbar spine MRI for further characterization. Electronically Signed   By: Franki Cabot M.D.   On: 05/05/2019 10:35    Procedures Procedures (including critical care time)  Medications Ordered in ED Medications  sodium chloride flush (NS) 0.9 % injection  3 mL (has no administration in time range)  oxyCODONE-acetaminophen (PERCOCET/ROXICET) 5-325 MG per tablet 1 tablet (1 tablet Oral Given 05/05/19 0959)  diazepam (VALIUM) tablet 5 mg (5 mg Oral Given 05/05/19 0959)  acetaminophen (TYLENOL) tablet  1,000 mg (1,000 mg Oral Given 05/05/19 0959)  ketorolac (TORADOL) 15 MG/ML injection 15 mg (15 mg Intramuscular Given 05/05/19 0959)     Initial Impression / Assessment and Plan / ED Course  I have reviewed the triage vital signs and the nursing notes.  Pertinent labs & imaging results that were available during my care of the patient were reviewed by me and considered in my medical decision making (see chart for details).  Clinical Course as of May 05 1111  Sun May 05, 2019  [id number] Hgb urine dipstickMarland Kitchen): MODERATE [JS]  0934 Hgb urine dipstickMarland Kitchen): MODERATE [JS]    Clinical Course User Index [JS] Janeece Fitting, PA-C      Patient with a past medical history of right sciatica presents to the ED with complaints of lower back pain which began yesterday, she reports a stabbing sensation to her left flank along with her left lower lumbar area with radiation to her buttocks.  She taken some Flexeril without improvement in symptoms.  She does not have any red flag such as fevers, urinary retention, bowel incontinence.  No urinary complaints, although UA was remarkable for a small amount of hemoglobin.  Cannot rule out kidney abnormality, will order CT renal to further evaluate.  CBC was unremarkable.  CMP without any electrolyte derangement, LFTs are within normal limits.  Kidney function is preserved.  Lipase is normal.  hCG is negative. CT renal study showed 1. No acute findings within the abdomen or pelvis. No bowel  obstruction or evidence of bowel wall inflammation. Appendix is  normal.  2. 2 mm nonobstructing stone within the lower pole of the RIGHT  renal pelvis. No ureteral or bladder calculi. No hydronephrosis.  3. Mild colonic diverticulosis  without evidence of acute  diverticulitis.  4. Degenerative spondylosis at the lumbosacral junction, moderate in  degree with associated disc-osteophytic bulge causing moderate  central canal stenosis and possible associated nerve root  impingement. If any radiculopathic symptoms, would consider  nonemergent lumbar spine MRI for further characterization.     Suspect that pain is likely coming from musculoskeletal, nerve impingement.  Will provide patient with referral to urology to further evaluate blood in her urine.  Patient was reevaluated after receiving pain medication for her back she is able to ambulate with a steady gait and without her cane.  She is able to sit on a chair, has equal DP pulses, good strength on her lower extremities.  Will give patient referral for neurosurgery, she does receive hydrocortisone injections from her orthopedist, she will go home on a short steroid burst, advised not to receive cortisone injections as she will be treated with steroids.  Denies any previous history of diabetes.  Will not provide patient with muscle relaxers that she does have a current prescription for Flexeril which she has been taking at home.  We will provide her with lidocaine patches to help with symptoms.  Patient understands and agrees with management.  Vitals otherwise within normal limits.  Denies any IV drug use, fevers, urinary retention or bowel incontinence.  Patient stable for discharge.  Return precautions described at length.   Portions of this note were generated with Lobbyist. Dictation errors may occur despite best attempts at proofreading.  Final Clinical Impressions(s) / ED Diagnoses   Final diagnoses:  Acute left-sided low back pain with sciatica, sciatica laterality unspecified    ED Discharge Orders         Ordered    predniSONE (DELTASONE) 20 MG  tablet  Daily     05/05/19 1107    lidocaine (LIDODERM) 5 %  Every 24 hours     05/05/19 1108            Janeece Fitting, PA-C 05/05/19 1113    Maudie Flakes, MD 05/10/19 1537

## 2019-05-07 ENCOUNTER — Ambulatory Visit (INDEPENDENT_AMBULATORY_CARE_PROVIDER_SITE_OTHER): Payer: BC Managed Care – PPO | Admitting: Family Medicine

## 2019-05-07 ENCOUNTER — Ambulatory Visit (INDEPENDENT_AMBULATORY_CARE_PROVIDER_SITE_OTHER)
Admission: RE | Admit: 2019-05-07 | Discharge: 2019-05-07 | Disposition: A | Discharge status: Home / Self Care | Payer: BC Managed Care – PPO | Source: Ambulatory Visit | Attending: Family Medicine | Admitting: Family Medicine

## 2019-05-07 ENCOUNTER — Other Ambulatory Visit: Payer: Self-pay

## 2019-05-07 ENCOUNTER — Other Ambulatory Visit: Payer: Self-pay | Admitting: Family Medicine

## 2019-05-07 ENCOUNTER — Encounter: Payer: Self-pay | Admitting: Family Medicine

## 2019-05-07 VITALS — BP 130/88 | HR 93 | Ht 62.0 in | Wt 178.0 lb

## 2019-05-07 DIAGNOSIS — M545 Low back pain, unspecified: Secondary | ICD-10-CM

## 2019-05-07 DIAGNOSIS — M5416 Radiculopathy, lumbar region: Secondary | ICD-10-CM | POA: Diagnosis not present

## 2019-05-07 DIAGNOSIS — M1612 Unilateral primary osteoarthritis, left hip: Secondary | ICD-10-CM | POA: Diagnosis not present

## 2019-05-07 DIAGNOSIS — M25552 Pain in left hip: Secondary | ICD-10-CM

## 2019-05-07 DIAGNOSIS — M7062 Trochanteric bursitis, left hip: Secondary | ICD-10-CM | POA: Insufficient documentation

## 2019-05-07 MED ORDER — VENLAFAXINE HCL ER 37.5 MG PO CP24: 37.5000 mg | ORAL_CAPSULE | Freq: Every day | ORAL | 1 refills | Status: DC

## 2019-05-07 NOTE — Patient Instructions (Signed)
Exercises 3x a week Ice 20 min 2x a day  Send me a message in one week  Will get MRI if not better Ray

## 2019-05-07 NOTE — Assessment & Plan Note (Signed)
Left lumbar radiculopathy that is likely contributing to pain.  Patient getting x-rays at the moment of the back and the hip and will see if this change management.  Likelihood does more for the lumbar radiculopathy.  Pain seems to be more on the lateral aspect of the hips will try to greater trochanteric injection for some comfort.  Encourage her to finish the prednisone, patient unable to tolerate gabapentin previously.  Start low dose of Effexor for more of a neuromodulator effect.  Patient if no improvement advanced imaging would be warranted expects especially if weakness of the lower extremity occurs.  Patient will be followed up in the near future.

## 2019-05-07 NOTE — Assessment & Plan Note (Signed)
Greater trochanteric injection given but I think likelihood is more of a lumbar radiculopathy.  We will see how patient responds.  Follow-up again in 4 to 8 weeks

## 2019-05-07 NOTE — Progress Notes (Signed)
[name] [address] [place], [place] [address] Phone: [phone] Subjective:   [name], am serving as a scribe for Dr. Hulan Saas.  I'm seeing this patient by the request  of:    CC: Back and hip pain  [id number]  Grace Lyons is a 58 y.o. female coming in with complaint of left hip pain. Pain began on 05/04/2019. Patient's leg gave out when she was throwing a ball. Has pain with sitting down. Pain has not gone away. Is on prednisone and robaxin. Had shot of toradol. Patient woke up last night and was unable to walk to the bathroom due to pain from the hip to the lower leg. Moved back into the bed and felt a pop and then the pain went away. Pain starts in glute and radiates down to the back of knee.  Patient states initially the pain was 10 out of 10.  Now seems to be improving a little bit but with still states 6 out of 10.  Continues to deny pain to walk on a regular basis.  Past Medical History:  Diagnosis Date  . Cervical polyp   . Degenerative arthritis of knee, bilateral 12/09/2015   Injections 12/09/2015 Started Orthovisc left knee 02/03/2016   . Fibroid   . H/O left knee surgery 09/2017  . Knee pain, left 12/29/2010  . Menometrorrhagia   . Vitamin D deficiency    Past Surgical History:  Procedure Laterality Date  . COMBINED HYSTEROSCOPY DIAGNOSTIC / D&C    . ENDOMETRIAL BIOPSY  12/17/2008  . HYSTEROSCOPY W/D&C  08/31/2012   Procedure: DILATATION AND CURETTAGE /HYSTEROSCOPY;  Surgeon: Delice Lesch, MD;  Location: [facility];  Service: Gynecology;  Laterality: N/A;  with resectoscope  . KNEE SURGERY    . TUBAL LIGATION  1995   Social History   Socioeconomic History  . Marital status: Single    Spouse name: Not on file  . Number of children: Not on file  . Years of education: Not on file  . Highest education level: Not on file  Occupational History    Employer: [employer]  Social Needs  . Financial resource  strain: Not on file  . Food insecurity    Worry: Not on file    Inability: Not on file  . Transportation needs    Medical: Not on file    Non-medical: Not on file  Tobacco Use  . Smoking status: Former Smoker    Quit date: 01/12/1996    Years since quitting: 23.3  . Smokeless tobacco: Never Used  . Tobacco comment: Single, lives alone. 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 Alcohol use: No  . Drug use: No  . Sexual activity: Not Currently    Birth control/protection: Surgical    Comment: BTL  Lifestyle  . Physical activity    Days per week: Not on file    Minutes per session: Not on file  . Stress: Not on file  Relationships  . Social Herbalist on phone: Not on file    Gets together: Not on file    Attends religious service: Not on file    Active member of club or organization: Not on file    Attends meetings of clubs or organizations: Not on file    Relationship status: Not on file  Other Topics Concern  .  Not on file  Social History Narrative  . Not on file   Allergies  Allergen Reactions  . Doxycycline Rash   Family History  Problem Relation Age of Onset  . Diabetes Mother   . Hypertension Mother   . Heart disease Mother   . Diabetes Father   . Hypertension Father   . Prostate cancer Father     Current Outpatient Medications (Endocrine & Metabolic):  .  predniSONE (DELTASONE) 20 MG tablet, Take 2 tablets (40 mg total) by mouth daily for 5 days.    Current Outpatient Medications (Analgesics):  .  ibuprofen (ADVIL) 800 MG tablet, Take 1 tablet (800 mg total) by mouth 3 (three) times daily.   Current Outpatient Medications (Other):  Marland Kitchen  Cholecalciferol (VITAMIN D3) 1000 UNITS CAPS, Take 1,000 Units by mouth daily.  Marland Kitchen  lidocaine (LIDODERM) 5 %, Place 1 patch onto the skin daily for 5 days. Remove & Discard patch within 12 hours or as directed by MD .  miconazole  (MICATIN) 2 % cream, Apply 1 application topically 2 (two) times daily. .  Omega-3 Fatty Acids (OMEGA 3 PO), Take 1 capsule by mouth daily.  Marland Kitchen  VITAMIN A PO, Take by mouth. .  venlafaxine XR (EFFEXOR XR) 37.5 MG 24 hr capsule, Take 1 capsule (37.5 mg total) by mouth daily with breakfast.    Past medical history, social, surgical and family history all reviewed in electronic medical record.  No pertanent information unless stated regarding to the chief complaint.   Review of Systems:  No headache, visual changes, nausea, vomiting, diarrhea, constipation, dizziness, abdominal pain, skin rash, fevers, chills, night sweats, weight loss, swollen lymph nodes, body aches, joint swelling,  chest pain, shortness of breath, mood changes.  Positive muscle aches  Objective  Blood pressure 130/88, pulse 93, height 5\' 2"  (1.575 m), weight 178 lb (80.7 kg), last menstrual period 08/14/2012, SpO2 98 %.    General: No apparent distress alert and oriented x3 mood and affect normal, dressed appropriately.  HEENT: Pupils equal, extraocular movements intact  Respiratory: Patient's speak in full sentences and does not appear short of breath  Cardiovascular: No lower extremity edema, non tender, no erythema  Skin: Warm dry intact with no signs of infection or rash on extremities or on axial skeleton.  Abdomen: Soft nontender  Neuro: Cranial nerves II through XII are intact, neurovascularly intact in all extremities with 2+ DTRs and 2+ pulses.  Lymph: No lymphadenopathy of posterior or anterior cervical chain or axillae bilaterally.  Gait severely antalgic MSK:  tender with limited range of motion and good stability and symmetric strength and tone of shoulders, elbows, wrist, , and ankles bilaterally.  Tightness on the right and left side of the hips.  Tightness with Corky Sox.  Positive straight leg test though left side.  Forward flexion of 20 degrees cause radicular symptoms in the L5 nerve root distribution.  Deep  tendon reflexes intact and 5 out of 5 strength of the lower extremity.  After verbal consent patient was prepped with alcohol swabs and with a 21-gauge 2 inch needle injected into the left greater trochanteric area with a total of 1 cc of 0.5% Marcaine and 1 cc of Kenalog 40 mg/mL left    Impression and Recommendations:     This case required medical decision making of moderate complexity. The above documentation has been reviewed and is accurate and complete Lyndal Pulley, DO       Note: This dictation was prepared with  Dragon dictation along with smaller Company secretary. Any transcriptional errors that result from this process are unintentional.

## 2019-05-09 DIAGNOSIS — N200 Calculus of kidney: Secondary | ICD-10-CM | POA: Diagnosis not present

## 2019-05-09 DIAGNOSIS — R3121 Asymptomatic microscopic hematuria: Secondary | ICD-10-CM | POA: Diagnosis not present

## 2019-05-09 DIAGNOSIS — M545 Low back pain: Secondary | ICD-10-CM | POA: Diagnosis not present

## 2019-07-17 ENCOUNTER — Other Ambulatory Visit: Payer: Self-pay | Admitting: Obstetrics and Gynecology

## 2019-07-17 DIAGNOSIS — N63 Unspecified lump in unspecified breast: Secondary | ICD-10-CM

## 2019-07-23 ENCOUNTER — Ambulatory Visit (HOSPITAL_COMMUNITY): Payer: BC Managed Care – PPO

## 2019-07-31 ENCOUNTER — Encounter: Payer: Self-pay | Admitting: Family Medicine

## 2019-07-31 ENCOUNTER — Telehealth: Payer: Self-pay | Admitting: *Deleted

## 2019-07-31 MED ORDER — IBUPROFEN 800 MG PO TABS: 800.0000 mg | ORAL_TABLET | Freq: Three times a day (TID) | ORAL | 0 refills | Status: DC

## 2019-07-31 NOTE — Telephone Encounter (Signed)
Pt left msg requesting a refill for ibuprofen 800mg  (not Duexis) be sent into her pharmacy. It does not look like Dr. Tamala Julian has even prescribed this. Pt stated she lost her insurance until the new year and would just a like a prescription sent in. She will schedule an appt for January.

## 2019-08-02 ENCOUNTER — Ambulatory Visit: Payer: BC Managed Care – PPO | Admitting: Family Medicine

## 2019-08-02 IMAGING — CT CT HEAD W/O CM
4 series · 16 of 47 positions shown, 18 images · non-contrast
Comparison: None.

CLINICAL DATA: Sudden onset of dizziness.

EXAM:
CT HEAD WITHOUT CONTRAST
TECHNIQUE: Contiguous axial images were obtained from the base of the skull
through the vertex without intravenous contrast.

[Series 3: head wo · axial · 0.45mm/px · z∈[+514,+628]mm · 7 of 31 slices shown, 9 images]
[im 4/31  brain]
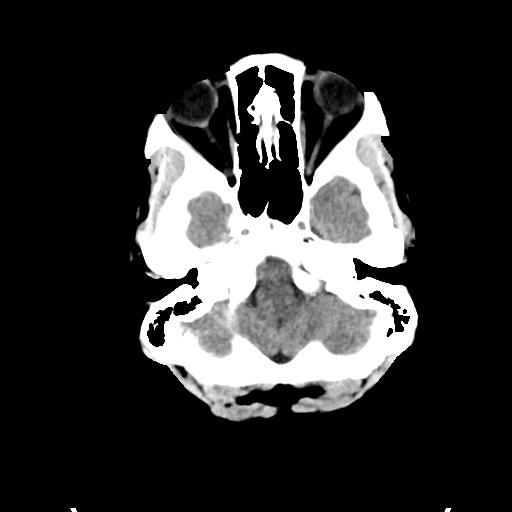
[im 4/31  bone]
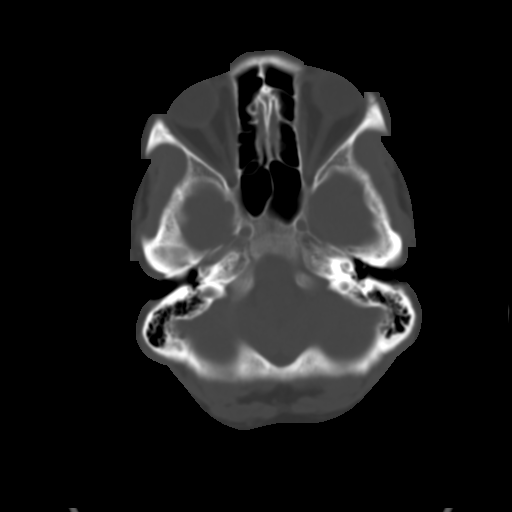
[im 8/31  brain]
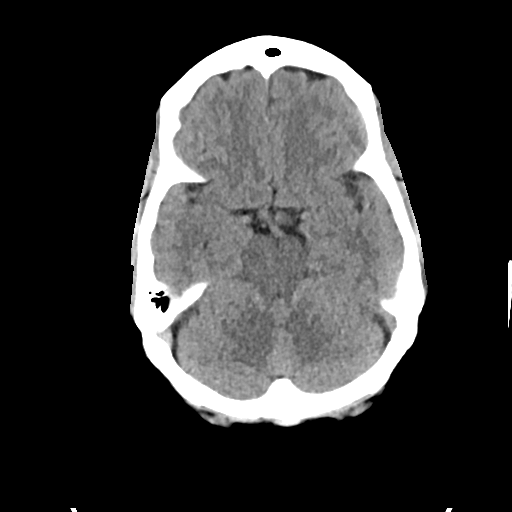
[im 12/31  brain]
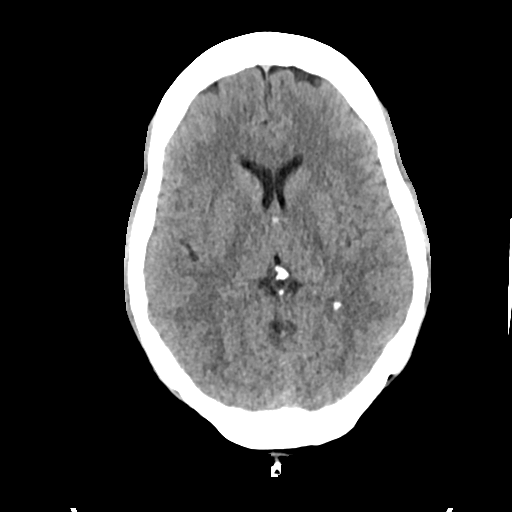
[im 16/31  brain]
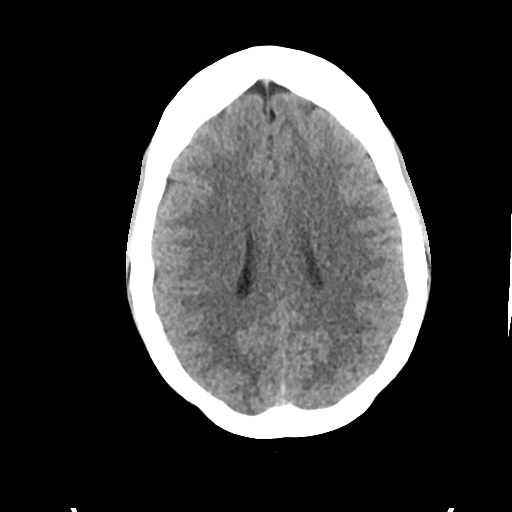
[im 19/31  brain]
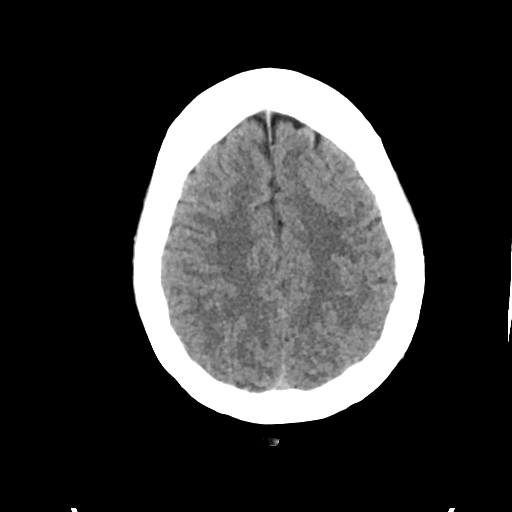
[im 19/31  bone]
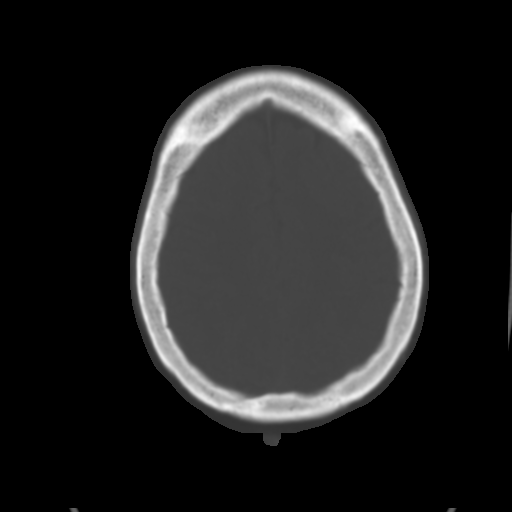
[im 23/31  brain]
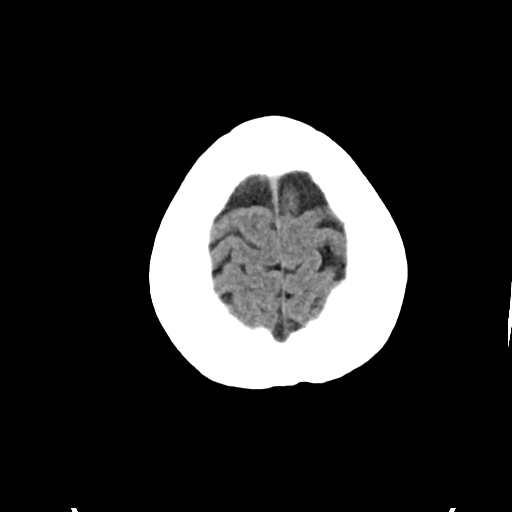
[im 27/31  brain]
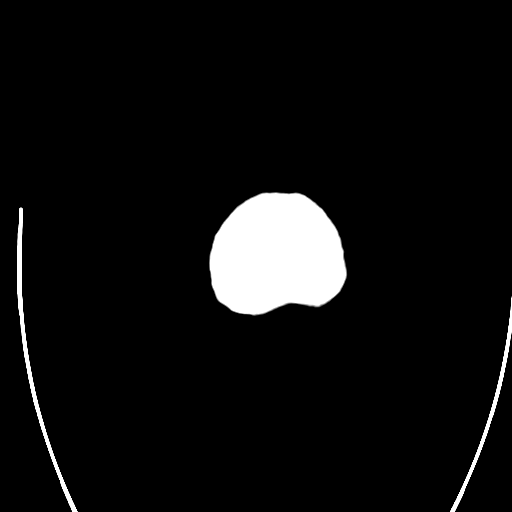

[Series 4: head bone · axial · 0.45mm/px · z∈[+512,+542]mm · 3 of 77 slices shown]
[im 8/77  bone]
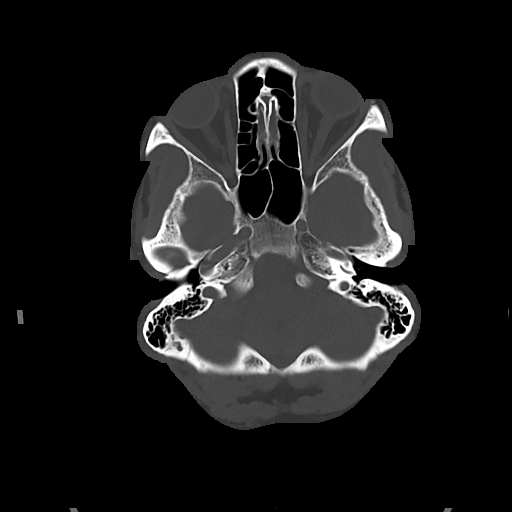
[im 16/77  bone]
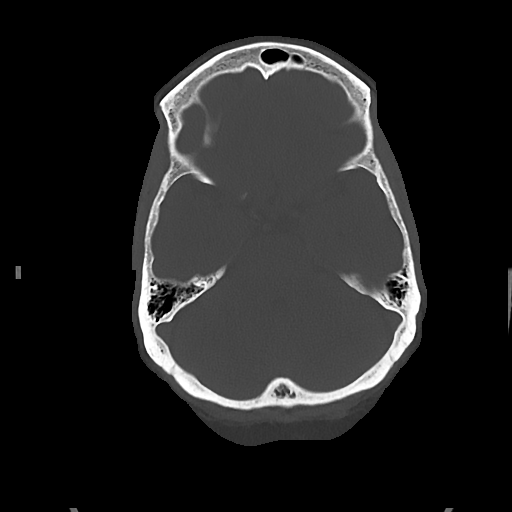
[im 23/77  bone]
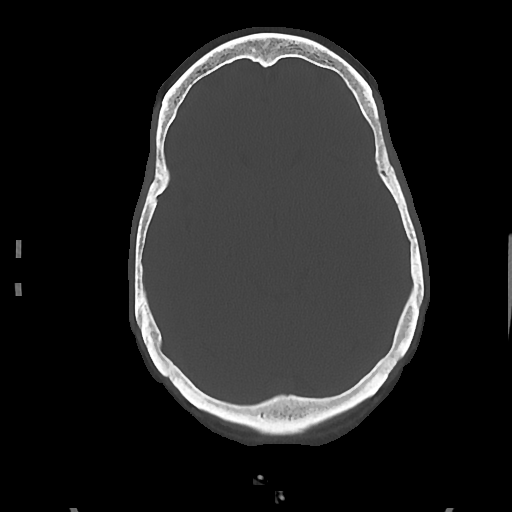

[Series 5: cor soft · coronal · 0.30mm/px · 3 of 67 slices shown]
[im 23/67  brain]
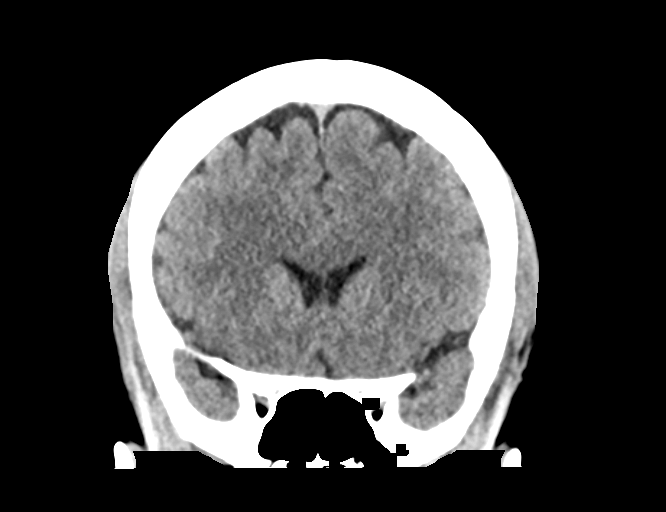
[im 30/67  brain]
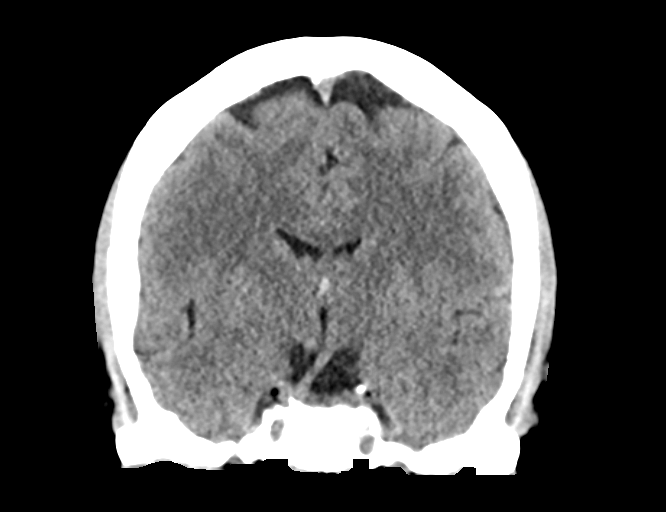
[im 37/67  brain]
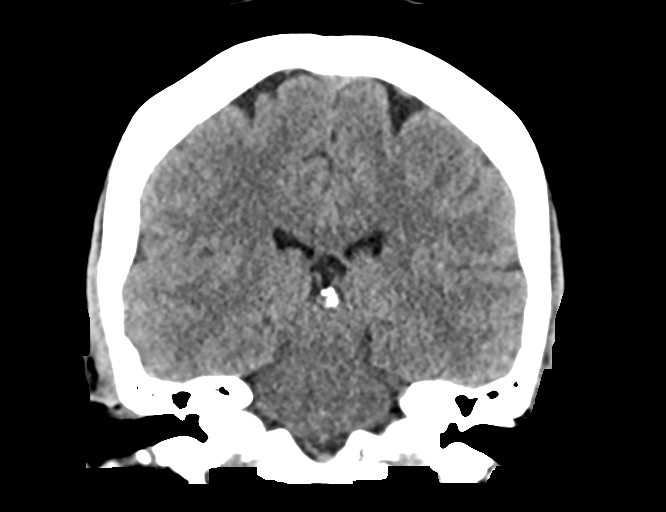

[Series 6: sag soft · sagittal · 0.31mm/px · 3 of 67 slices shown]
[im 23/67  brain]
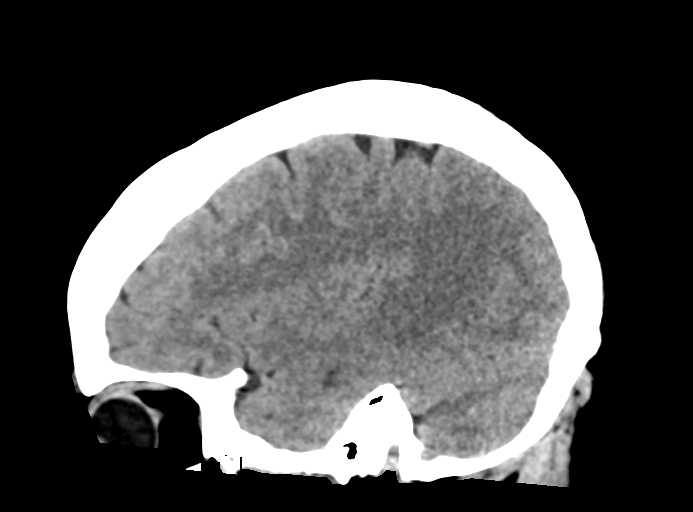
[im 34/67  brain]
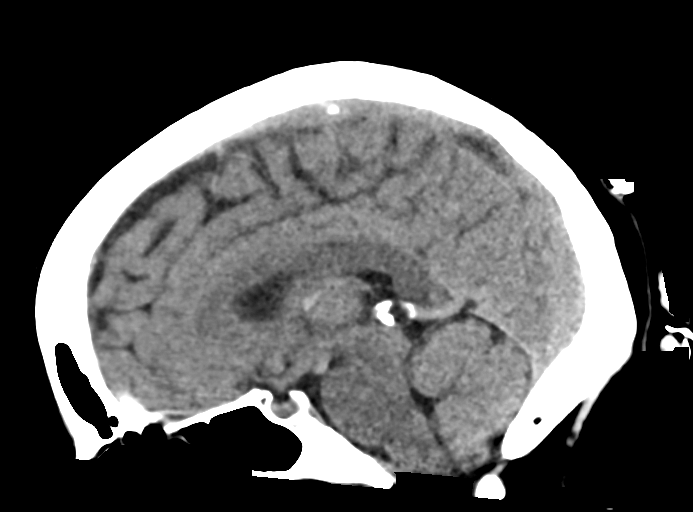
[im 45/67  brain]
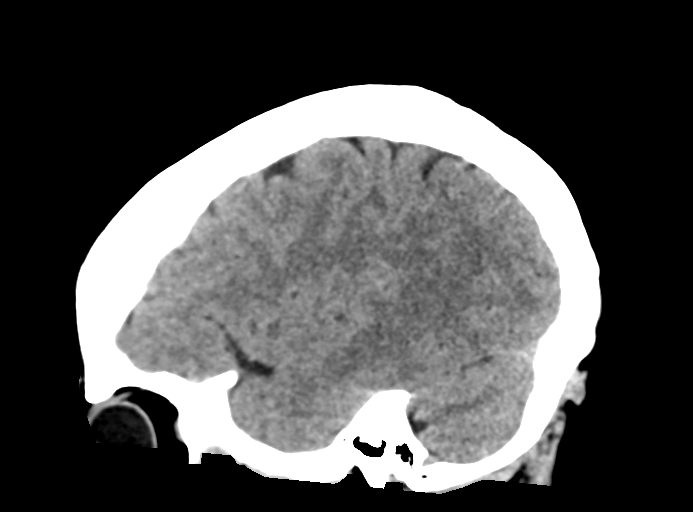

[16 of 47 positions shown; findings below may reference images not displayed]

FINDINGS: Brain: No intracranial hemorrhage, mass effect, or midline shift. No
hydrocephalus. The basilar cisterns are patent. No evidence of
territorial infarct or acute ischemia. No extra-axial or
intracranial fluid collection.

Vascular: No hyperdense vessel or unexpected calcification.

Skull: No fracture or focal lesion.

Sinuses/Orbits: Paranasal sinuses and mastoid air cells are clear.
The visualized orbits are unremarkable.

Other: None.
IMPRESSION: Negative head CT.

## 2019-08-02 NOTE — Telephone Encounter (Signed)
Yesterday

## 2019-08-02 NOTE — Telephone Encounter (Signed)
Left patient message

## 2019-08-06 NOTE — Telephone Encounter (Signed)
Patient received medication.

## 2019-08-07 ENCOUNTER — Telehealth (HOSPITAL_COMMUNITY): Payer: Self-pay | Admitting: *Deleted

## 2019-08-07 NOTE — Telephone Encounter (Signed)
Patient called and left a voicemail. Called patient back. Patient currently has insurance that will expire 08/15/2019. Patient wanted to schedule mammogram prior to her insurance expiring. Patient is scheduled to come to [facility] 08/29/2019. Explained to patient that her follow-up diagnostic mammogram is due after 08/23/2019 due to 6 month follow-up was recommended. Explained that [facility] will not cover until it has been 67-months and a day. Informed her most insurances are the same. Told patient [facility] can [facility] to discuss if wants to schedule prior to 6 months. Explained to patient that [facility] will cover the cost of her follow-up and if any additional follow-up is recommended. Patient stated will keep [facility] appointment and verbalized understanding.

## 2019-08-12 NOTE — Progress Notes (Signed)
[facility] [place], [place] [address] Phone: [phone] Subjective:     CC: shoulder pain  [id number]   05/07/2019 Left lumbar radiculopathy that is likely contributing to pain.  Patient getting x-rays at the moment of the back and the hip and will see if this change management.  Likelihood does more for the lumbar radiculopathy.  Pain seems to be more on the lateral aspect of the hips will try to greater trochanteric injection for some comfort.  Encourage her to finish the prednisone, patient unable to tolerate gabapentin previously.  Start low dose of Effexor for more of a neuromodulator effect.  Patient if no improvement advanced imaging would be warranted expects especially if weakness of the lower extremity occurs.  Patient will be followed up in the near future.  Greater trochanteric injection given but I think likelihood is more of a lumbar radiculopathy.  We will see how patient responds.  Follow-up again in 4 to 8 weeks  Update 08/13/2019 Grace Lyons is a 58 y.o. female coming in with complaint of right shoulder pain. Patient states that she began having neck and right shoulder pain once the cold weather started. Has been using IBU and oxycodone. Pain is worse at night when lying on her back. Denies any radiating symptoms.     Past Medical History:  Diagnosis Date  . Cervical polyp   . Degenerative arthritis of knee, bilateral 12/09/2015   Injections 12/09/2015 Started Orthovisc left knee 02/03/2016   . Fibroid   . H/O left knee surgery 09/2017  . Knee pain, left 12/29/2010  . Menometrorrhagia   . Vitamin D deficiency    Past Surgical History:  Procedure Laterality Date  . COMBINED HYSTEROSCOPY DIAGNOSTIC / D&C    . ENDOMETRIAL BIOPSY  12/17/2008  . HYSTEROSCOPY WITH D & C  08/31/2012   Procedure: DILATATION AND CURETTAGE /HYSTEROSCOPY;  Surgeon: Delice Lesch, MD;  Location: [facility];  Service: Gynecology;  Laterality: N/A;   with resectoscope  . KNEE SURGERY    . TUBAL LIGATION  1995   Social History   Socioeconomic History  . Marital status: Single    Spouse name: Not on file  . Number of children: Not on file  . Years of education: Not on file  . Highest education level: Not on file  Occupational History    Employer: [employer]  Tobacco Use  . Smoking status: Former Smoker    Quit date: 01/12/1996    Years since quitting: 23.6  . Smokeless tobacco: Never Used  . Tobacco comment: Single, lives alone. Works as Licensed conveyancer at [employer]. Has 24yo son at [place]  Substance and Sexual Activity  . Alcohol use: No  . Drug use: No  . Sexual activity: Not Currently    Birth control/protection: Surgical    Comment: BTL  Other Topics Concern  . Not on file  Social History Narrative  . Not on file   Social Determinants of Health   Financial Resource Strain:   . Difficulty of Paying Living Expenses: Not on file  Food Insecurity:   . Worried About Charity fundraiser in the Last Year: Not on file  . Ran Out of Food in the Last Year: Not on file  Transportation Needs:   . Lack of Transportation (Medical): Not on file  . Lack of Transportation (Non-Medical): Not on file  Physical Activity:   . Days of  Exercise per Week: Not on file  . Minutes of Exercise per Session: Not on file  Stress:   . Feeling of Stress : Not on file  Social Connections:   . Frequency of Communication with Friends and Family: Not on file  . Frequency of Social Gatherings with Friends and Family: Not on file  . Attends Religious Services: Not on file  . Active Member of Clubs or Organizations: Not on file  . Attends Archivist Meetings: Not on file  . Marital Status: Not on file   Allergies  Allergen Reactions  . Doxycycline Rash   Family History  Problem Relation Age of Onset  . Diabetes Mother   . Hypertension Mother   . Heart disease Mother   . Diabetes Father   .  Hypertension Father   . Prostate cancer Father        Current Outpatient Medications (Analgesics):  .  ibuprofen (ADVIL) 800 MG tablet, Take 1 tablet (800 mg total) by mouth 3 (three) times daily.   Current Outpatient Medications (Other):  Marland Kitchen  Cholecalciferol (VITAMIN D3) 1000 UNITS CAPS, Take 1,000 Units by mouth daily.  .  miconazole (MICATIN) 2 % cream, Apply 1 application topically 2 (two) times daily. .  Omega-3 Fatty Acids (OMEGA 3 PO), Take 1 capsule by mouth daily.  Marland Kitchen  venlafaxine XR (EFFEXOR XR) 37.5 MG 24 hr capsule, Take 1 capsule (37.5 mg total) by mouth daily with breakfast. .  VITAMIN A PO, Take by mouth. Marland Kitchen  tiZANidine (ZANAFLEX) 4 MG tablet, Take 1 tablet (4 mg total) by mouth Nightly for 10 days.    Past medical history, social, surgical and family history all reviewed in electronic medical record.  No pertanent information unless stated regarding to the chief complaint.   Review of Systems:  No headache, visual changes, nausea, vomiting, diarrhea, constipation, dizziness, abdominal pain, skin rash, fevers, chills, night sweats, weight loss, swollen lymph nodes, body aches, joint swelling, muscle aches, chest pain, shortness of breath, mood changes.   Objective  Blood pressure 132/90, pulse 79, height 5\' 2"  (1.575 m), weight 174 lb (78.9 kg), last menstrual period 08/14/2012, SpO2 97 %.    General: No apparent distress alert and oriented x3 mood and affect normal, dressed appropriately.  HEENT: Pupils equal, extraocular movements intact  Respiratory: Patient's speak in full sentences and does not appear short of breath  Cardiovascular: No lower extremity edema, non tender, no erythema  Skin: Warm dry intact with no signs of infection or rash on extremities or on axial skeleton.  Abdomen: Soft nontender  Neuro: Cranial nerves II through XII are intact, neurovascularly intact in all extremities with 2+ DTRs and 2+ pulses.  Lymph: No lymphadenopathy of posterior or  anterior cervical chain or axillae bilaterally.  Gait normal with good balance and coordination.  MSK:  Non tender with full range of motion and good stability and symmetric strength and tone of  elbows, wrist, hip, knee and ankles bilaterally.  Shoulder: Right Inspection reveals no abnormalities, atrophy or asymmetry. Palpation is normal with no tenderness over AC joint or bicipital groove. ROM is full in all planes passively. Rotator cuff strength normal throughout. signs of impingement with positive Neer and Hawkin's tests, but negative empty can sign. Speeds and Yergason's tests normal. Normal scapular function observed. No painful arc and no drop arm sign. No apprehension sign Tightness noted of right shoulder, multiple trigger points.   After verbal consent patient was prepped with alcohol swabs and with  a 25-gauge half inch needle injected in 3 distinct trigger points in the right shoulder region.  3 different muscles including the trapezius, levator scapula, and rhomboid muscle.  Total of 3 cc of 0.5% Marcaine and 1 cc of Kenalog 40 mg/mL no blood loss.  Postinjection instructions given     Impression and Recommendations:     This case required medical decision making of moderate complexity. The above documentation has been reviewed and is accurate and complete Lyndal Pulley, DO       Note: This dictation was prepared with Dragon dictation along with smaller phrase technology. Any transcriptional errors that result from this process are unintentional.

## 2019-08-13 ENCOUNTER — Ambulatory Visit (INDEPENDENT_AMBULATORY_CARE_PROVIDER_SITE_OTHER): Payer: BC Managed Care – PPO | Admitting: Family Medicine

## 2019-08-13 ENCOUNTER — Other Ambulatory Visit: Payer: Self-pay

## 2019-08-13 ENCOUNTER — Encounter: Payer: Self-pay | Admitting: Family Medicine

## 2019-08-13 DIAGNOSIS — M25511 Pain in right shoulder: Secondary | ICD-10-CM | POA: Diagnosis not present

## 2019-08-13 MED ORDER — TIZANIDINE HCL 4 MG PO TABS: 4.0000 mg | ORAL_TABLET | Freq: Every evening | ORAL | 2 refills | Status: AC

## 2019-08-13 NOTE — Assessment & Plan Note (Signed)
Injections given today, discussed icing regimen, home exercise, which activities to do which wants to avoid.  We discussed with patient about posture and ergonomics, discussed the stress plays a potential role.  Patient given muscle relaxer to take at night.  Patient will follow up with me again in 3 to 4 weeks to make sure completely resolved.

## 2019-08-13 NOTE — Patient Instructions (Signed)
Trigger point injection Zanaflex at night See me in 4 weeks

## 2019-08-14 ENCOUNTER — Encounter: Payer: Self-pay | Admitting: Family Medicine

## 2019-08-26 ENCOUNTER — Ambulatory Visit: Payer: BC Managed Care – PPO | Admitting: Family Medicine

## 2019-08-29 ENCOUNTER — Other Ambulatory Visit: Payer: Self-pay

## 2019-08-29 ENCOUNTER — Ambulatory Visit
Admission: RE | Admit: 2019-08-29 | Discharge: 2019-08-29 | Disposition: A | Discharge status: Home / Self Care | Payer: 59 | Source: Ambulatory Visit | Attending: Obstetrics and Gynecology | Admitting: Obstetrics and Gynecology

## 2019-08-29 ENCOUNTER — Other Ambulatory Visit: Payer: No Typology Code available for payment source

## 2019-08-29 ENCOUNTER — Ambulatory Visit (HOSPITAL_COMMUNITY)
Admission: RE | Admit: 2019-08-29 | Discharge: 2019-08-29 | Disposition: A | Discharge status: Home / Self Care | Payer: 59 | Source: Ambulatory Visit | Attending: Obstetrics and Gynecology | Admitting: Obstetrics and Gynecology

## 2019-08-29 ENCOUNTER — Ambulatory Visit: Payer: BC Managed Care – PPO

## 2019-08-29 ENCOUNTER — Encounter (HOSPITAL_COMMUNITY): Payer: Self-pay

## 2019-08-29 ENCOUNTER — Other Ambulatory Visit: Payer: Self-pay | Admitting: Obstetrics and Gynecology

## 2019-08-29 DIAGNOSIS — Z1239 Encounter for other screening for malignant neoplasm of breast: Secondary | ICD-10-CM

## 2019-08-29 DIAGNOSIS — N63 Unspecified lump in unspecified breast: Secondary | ICD-10-CM

## 2019-08-29 DIAGNOSIS — Z0000 Encounter for general adult medical examination without abnormal findings: Secondary | ICD-10-CM | POA: Insufficient documentation

## 2019-08-29 NOTE — Patient Instructions (Signed)
Explained breast self awareness with Logyn R Kastelic. Patient did not need a Pap smear today due to last Pap smear was in August 2020 per patient. Let her know BCCCP will cover Pap smears every 3 years unless has a history of abnormal Pap smears. Referred patient to the [facility] for a right breast diagnostic mammogram and ultrasound per recommendation. Appointment scheduled for Thursday, August 28, 2018 at 1310. Patient aware of appointment and will be there. Chaquetta R Mclin verbalized understanding.  Anaysia Germer, Arvil Chaco, RN 11:07 AM

## 2019-08-29 NOTE — Progress Notes (Signed)
Patient referred to [facility] by the [facility] due to recommending 71-month follow-up of right breast. Last right breast diagnostic mammogram and ultrasound completed 02/20/2019. Last bilateral screening mammogram completed 02/04/2019 that recommended additional imaging of the right breast.  Pap Smear: Pap smear not completed today. Last Pap smear was in August 2020 at [facility] and normal per patient. Per patient has a history of an abnormal Pap smear 6 years ago that a repeat Pap smear was completed that was normal. Patient stated she has had at least three normal Pap smears since abnormal. Last Pap smear result is not in Epic. Pap smear result from 03/16/2016 is in [place].  Physical exam: Breasts Breasts symmetrical. No skin abnormalities bilateral breasts. No nipple retraction bilateral breasts. No nipple discharge bilateral breasts. No lymphadenopathy. No lumps palpated bilateral breasts. No complaints of pain or tenderness on exam. Referred patient to the [facility] for a right breast diagnostic mammogram and ultrasound per recommendation. Appointment scheduled for Thursday, August 28, 2018 at 1310.        Pelvic/Bimanual  No Pap smear completed today since last Pap smear was in August 2020 per patient. Pap smear not indicated per BCCCP guidelines.   Smoking History: Patient has never smoked.  Patient Navigation: Patient education provided. Access to services provided for patient through [facility] program.    Colorectal Cancer Screening: Per patient had a colonoscopy completed in 2012. No complaints today.   Breast and Cervical Cancer Risk Assessment: Patient has no family history of breast cancer, known genetic mutations, or radiation treatment to the chest before age 38. Patient has no history of cervical dysplasia, immunocompromised, or DES exposure in-utero.  Risk Assessment    Risk Scores      08/29/2019   Last edited by: Demetrius Revel, LPN   5-year risk: 1.4 %   Lifetime risk: 7.1 %

## 2019-08-30 ENCOUNTER — Other Ambulatory Visit: Payer: BC Managed Care – PPO

## 2019-09-20 ENCOUNTER — Ambulatory Visit: Payer: BC Managed Care – PPO | Admitting: Family Medicine

## 2019-12-10 ENCOUNTER — Ambulatory Visit: Payer: Self-pay

## 2019-12-10 ENCOUNTER — Ambulatory Visit (INDEPENDENT_AMBULATORY_CARE_PROVIDER_SITE_OTHER): Payer: 59 | Admitting: Family Medicine

## 2019-12-10 ENCOUNTER — Encounter: Payer: Self-pay | Admitting: Family Medicine

## 2019-12-10 ENCOUNTER — Other Ambulatory Visit: Payer: Self-pay

## 2019-12-10 ENCOUNTER — Ambulatory Visit: Payer: 59 | Admitting: Family Medicine

## 2019-12-10 VITALS — BP 140/84 | HR 72 | Ht 62.0 in | Wt 178.2 lb

## 2019-12-10 DIAGNOSIS — M7918 Myalgia, other site: Secondary | ICD-10-CM

## 2019-12-10 DIAGNOSIS — S39012A Strain of muscle, fascia and tendon of lower back, initial encounter: Secondary | ICD-10-CM

## 2019-12-10 MED ORDER — CYCLOBENZAPRINE HCL 5 MG PO TABS: 5.0000 mg | ORAL_TABLET | Freq: Three times a day (TID) | ORAL | 0 refills | Status: DC | PRN

## 2019-12-10 NOTE — Progress Notes (Deleted)
   I, [name], LAT, ATC, am serving as scribe for Dr. Lynne Leader.  Grace Lyons is a 59 y.o. female who presents to [place] at [facility] today for flare-up of low back pain.  She was last seen by Dr. Tamala Julian on 05/07/19 for LBP and L hip/thigh pain after being seen in the [facility] ED for similar c/o on 05/05/19. Since then, pt reports   Diagnostic testing: L-spine and L hip XR- 05/07/19   Pertinent review of systems: ***  Relevant historical information: ***   Exam:  LMP 08/14/2012  General: Well Developed, well nourished, and in no acute distress.   MSK: ***    Lab and Radiology Results No results found for this or any previous visit (from the past 72 hour(s)). No results found.     Assessment and Plan: 59 y.o. female with ***   PDMP not reviewed this encounter. No orders of the defined types were placed in this encounter.  No orders of the defined types were placed in this encounter.    Discussed warning signs or symptoms. Please see discharge instructions. Patient expresses understanding.   ***

## 2019-12-10 NOTE — Patient Instructions (Addendum)
Thank you for coming in today. Call or go to the ER if you develop a large red swollen joint with extreme pain or oozing puss.  Attend PT.  Use heating pad and TENS unit.  Let me know if you need me to prescribe other medicine prior to your trip.  If you are struggling prior to your trip let me know.  We can figure out how communicate over the weekend.

## 2019-12-10 NOTE — Progress Notes (Signed)
I, Grace Lyons, LAT, ATC, am serving as scribe for Dr. Lynne Leader.  Grace Lyons is a 59 y.o. female who presents to [place] at [facility] today for f/u of low back pain.  She was last seen by Dr. Tamala Julian for low back pain and L leg pain on 05/07/19 that was thought to be lumbar radiculopathy in nature.  Since then, pt reports worsening over the last week w/ radiating pain into her B buttocks.  Pain is worse w/ lumbar flexion, proloned sitting and transitions from sit-to-stand.  She is planning on flying on Saturday to go to a wedding in [place].  She denies any numbness/tingling or weakness in her B LEs.  She has been taking Tylenol and took one Effexor.  She has also iced her lower back.  She had trigger point injections or joint injections in the past that have helped similar episodes.  Pain is predominant located in the left low back/upper buttocks area.  No radiating pain.  Diagnostic imaging: L-spine and L hip XR- 05/07/19  Pertinent review of systems: No fevers or chills  Relevant historical information: Diabetes   Exam:  BP 140/84 (BP Location: Right Arm, Patient Position: Sitting, Cuff Size: Normal)   Pulse 72   Ht 5\' 2"  (1.575 m)   Wt 178 lb 3.2 oz (80.8 kg)   LMP 08/14/2012   SpO2 95%   BMI 32.59 kg/m  General: Well Developed, well nourished, and in no acute distress.   MSK: L-spine: Patient sits in a flexed to the right position offloading left buttocks. Otherwise L-spine is normal-appearing Significantly reduced range of motion in all motions. Nontender to spinal midline.  Tender palpation just lateral to the left SI joint and the superior portion of buttocks along the iliac crest. Lower extremity strength and reflexes are intact distally. Antalgic gait.    Lab and Radiology Results  EXAM: LUMBAR SPINE - COMPLETE 4+ VIEW  COMPARISON:  CT 05/05/2019.  Lumbar spine 01/23/2017.  FINDINGS: Lumbar spine numbered the lowest segmented to  partially segmented lumbar shaped vertebral body as L5. Diffuse multilevel degenerative change. Disc space loss and endplate osteophyte formation most prominent L5-S1. 2 mm anterolisthesis L4 on L5. No interim change from prior exams. No acute abnormality identified.  IMPRESSION: Diffuse severe multilevel degenerative change again noted. Degenerative changes most prominent at L5-S1. 2 mm anterolisthesis L4 on L5. No interim change from prior exams. No acute abnormality.   Electronically Signed   By: [place]   On: 05/08/2019 06:03 EXAM: DG HIP (WITH OR WITHOUT PELVIS) 2-3V LEFT  COMPARISON:  CT 05/05/2019.  FINDINGS: Degenerative changes lumbar spine. Mild degenerative changes both hips. No acute bony abnormality. No evidence of fracture dislocation. Prominent amount of stool in the colon. Pelvic calcifications consistent phleboliths.  IMPRESSION: 1.  Mild degenerative changes both hips.  No acute abnormality.  2.  Degenerative changes lumbar spine.   Electronically Signed   By: Marcello Moores  Register   On: 05/08/2019 06:07   I, Lynne Leader, personally (independently) visualized and performed the interpretation of the images attached in this note.  Procedure: Real-time Ultrasound Guided Injection of left iliac crest posterior superior Device: Philips Affiniti 50G Images permanently stored and available for review in the ultrasound unit. Verbal informed consent obtained.  Discussed risks and benefits of procedure. Warned about infection bleeding damage to structures skin hypopigmentation and fat atrophy among others. Patient expresses understanding and agreement Time-out conducted.   Noted no overlying erythema, induration,  or other signs of local infection.   Skin prepped in a sterile fashion.   Local anesthesia: Topical Ethyl chloride.   With sterile technique and under real time ultrasound guidance:  40 mg of Kenalog and 2 mL of Marcaine injected easily.     Completed without difficulty   Pain partially resolved suggesting accurate placement of the medication.   Advised to call if fevers/chills, erythema, induration, drainage, or persistent bleeding.   Images permanently stored and available for review in the ultrasound unit.  Impression: Technically successful ultrasound guided injection.      Assessment and Plan: 59 y.o. female with left low back pain/buttocks pain ongoing for several days.  Patient has symptoms consistent with lumbosacral strain.  Ultimately best treatment will be physical therapy.  Referral placed today.  Additionally recommend heating pad and TENS unit.  Refilled cyclobenzaprine.  Additionally injected the area of maximum tenderness at left superior posterior iliac crest.  She did have some hypoechoic change at the muscle origin in this region which is likely the source of her pain.  Plan to keep in touch and report back if not doing well.  Could change medication management prior to her trip this weekend.   Orders Placed This Encounter  Procedures  . [place] LIMITED JOINT SPACE STRUCTURES LOW BILAT(NO LINKED CHARGES)    Order Specific Question:   Reason for Exam (SYMPTOM  OR DIAGNOSIS REQUIRED)    Answer:   SIJ pain    Order Specific Question:   Preferred imaging location?    Answer:   [facility]  . Ambulatory referral to Physical Therapy    Referral Priority:   Routine    Referral Type:   Physical Medicine    Referral Reason:   Specialty Services Required    Requested Specialty:   Physical Therapy   Meds ordered this encounter  Medications  . cyclobenzaprine (FLEXERIL) 5 MG tablet    Sig: Take 1-2 tablets (5-10 mg total) by mouth 3 (three) times daily as needed for muscle spasms.    Dispense:  20 tablet    Refill:  0     Discussed warning signs or symptoms. Please see discharge instructions. Patient expresses understanding.   The above documentation has been reviewed and is accurate  and complete Lynne Leader

## 2019-12-18 ENCOUNTER — Other Ambulatory Visit: Payer: Self-pay | Admitting: Family Medicine

## 2019-12-18 DIAGNOSIS — Z1231 Encounter for screening mammogram for malignant neoplasm of breast: Secondary | ICD-10-CM

## 2020-01-01 ENCOUNTER — Telehealth: Payer: Self-pay | Admitting: Family Medicine

## 2020-01-01 NOTE — Telephone Encounter (Signed)
Pt had a shot in her back 4/27, it has not really helped and the pain has moved a bit. Is she able to come in and get another injection? [id number]

## 2020-01-02 NOTE — Telephone Encounter (Signed)
Spoke with pt, she is feeling better today and she used the flexeril. She does not like to take many rx's but found this did provide relief. Advised her we could get her in today with [name] but since she is feeling better she will call us if needed.

## 2020-01-02 NOTE — Telephone Encounter (Signed)
Grace Lyons

## 2020-01-02 NOTE — Telephone Encounter (Signed)
YES. Please have pt schedule with me in the near future.

## 2020-01-02 NOTE — Telephone Encounter (Signed)
[place] office staff will call pt to schedule.

## 2020-02-06 ENCOUNTER — Ambulatory Visit
Admission: RE | Admit: 2020-02-06 | Discharge: 2020-02-06 | Disposition: A | Discharge status: Home / Self Care | Payer: Self-pay | Source: Ambulatory Visit | Attending: *Deleted | Admitting: *Deleted

## 2020-02-06 ENCOUNTER — Other Ambulatory Visit: Payer: Self-pay

## 2020-02-06 DIAGNOSIS — Z1231 Encounter for screening mammogram for malignant neoplasm of breast: Secondary | ICD-10-CM

## 2020-02-19 ENCOUNTER — Telehealth: Payer: Self-pay

## 2020-02-19 NOTE — Telephone Encounter (Signed)
Patient calls nurse line complaining of chest discomfort. Patient reports the discomfort started last night and has not let up, except for when she takes motrin. Patient reports she moved "funny" in her chair when she was getting it up and that's when the pain started. Patient denies SOB, nausea, lightheadedness, jaw discomfort, or left arm numbness. Patient was offered an apt, however she would prefer to wait and see if the pain gets better, as she feels its related to a muscle strain. Patient scheduled for Friday, however will call and cancel is she feels about is no longer needed. ED precautions were given.

## 2020-02-21 ENCOUNTER — Ambulatory Visit: Payer: Self-pay | Admitting: Family Medicine

## 2020-03-13 ENCOUNTER — Ambulatory Visit (INDEPENDENT_AMBULATORY_CARE_PROVIDER_SITE_OTHER): Payer: 59 | Admitting: Family Medicine

## 2020-03-13 ENCOUNTER — Other Ambulatory Visit: Payer: Self-pay

## 2020-03-13 ENCOUNTER — Encounter: Payer: Self-pay | Admitting: Family Medicine

## 2020-03-13 ENCOUNTER — Encounter: Payer: 59 | Admitting: Family Medicine

## 2020-03-13 VITALS — BP 110/70 | HR 74 | Ht 62.0 in | Wt 175.0 lb

## 2020-03-13 DIAGNOSIS — Z23 Encounter for immunization: Secondary | ICD-10-CM

## 2020-03-13 DIAGNOSIS — B3789 Other sites of candidiasis: Secondary | ICD-10-CM

## 2020-03-13 DIAGNOSIS — R21 Rash and other nonspecific skin eruption: Secondary | ICD-10-CM | POA: Diagnosis not present

## 2020-03-13 DIAGNOSIS — E119 Type 2 diabetes mellitus without complications: Secondary | ICD-10-CM

## 2020-03-13 DIAGNOSIS — R7303 Prediabetes: Secondary | ICD-10-CM

## 2020-03-13 LAB — POCT GLYCOSYLATED HEMOGLOBIN (HGB A1C): HbA1c, POC (controlled diabetic range): 5.8 % (ref 0.0–7.0)

## 2020-03-13 LAB — POCT UA - MICROALBUMIN
Albumin/Creatinine Ratio, Urine, POC: <30
Creatinine, POC: 100 mg/dL
Microalbumin Ur, POC: 10 mg/L

## 2020-03-13 MED ORDER — KETOCONAZOLE 2 % EX CREA: 1.0000 "application " | TOPICAL_CREAM | Freq: Every day | CUTANEOUS | 0 refills | Status: DC

## 2020-03-13 MED ORDER — SHINGRIX 50 MCG/0.5ML IM SUSR: 0.5000 mL | Freq: Once | INTRAMUSCULAR | 0 refills | Status: AC

## 2020-03-13 MED ORDER — TRIAMCINOLONE ACETONIDE 0.1 % EX CREA: 1.0000 "application " | TOPICAL_CREAM | Freq: Two times a day (BID) | CUTANEOUS | 0 refills | Status: DC

## 2020-03-13 NOTE — Patient Instructions (Signed)
It was great seeing you today!  Please check-out at the front desk before leaving the clinic. I'd like to see you back in 6 months but if you need to be seen earlier than that for any new issues we're happy to fit you in, just give [place] a call!  Visit Remembers: - Stop by the pharmacy to pick up your prescriptions  - Continue to work on your healthy eating habits and incorporating exercise into your daily life. - Your goal is to have an BP <140/80 - Medicine Changes: Triamcinolone cream on the arms for skin rash.  Use antifungal cream under breasts to fungal rash. It is important to keep skin underneath the breasts dry to prevent further.     Regarding lab work today:  Due to recent changes in healthcare laws, you may see the results of your imaging and laboratory studies on MyChart before your provider has had a chance to review them.  I understand that in some cases there may be results that are confusing or concerning to you. Not all laboratory results come back in the same time frame and you may be waiting for multiple results in order to interpret others.  Please give [place] 72 hours in order for your provider to thoroughly review all the results before contacting the office for clarification of your results. If everything is normal, you will get a letter in the mail or a message in My Chart. Please give [place] a call if you do not hear from [place] after 2 weeks.  Please bring all of your medications with you to each visit.    If you haven't already, sign up for My Chart to have easy access to your labs results, and communication with your primary care physician.  Feel free to call with any questions or concerns at any time, at [phone].   Take care,  Dr. Rushie [facility]

## 2020-03-13 NOTE — Progress Notes (Signed)
   SUBJECTIVE:   CHIEF COMPLAINT / HPI:   Chief Complaint  Patient presents with  . Annual Exam  . shingles vaccine  . preventative care     Grace Lyons is a 59 y.o. female here for physical. Pt also has skin rash under her breasts and arms. She has put her friends Clobetasol on her arms that helped but she ran out of this medication.    Patient requests Shingles vaccine.     PERTINENT  PMH / PSH: reviewed and updated as appropriate   OBJECTIVE:   BP 110/70   Pulse 74   Ht 5\' 2"  (1.575 m)   Wt 175 lb (79.4 kg)   LMP 08/14/2012   SpO2 98%   BMI 32.01 kg/m   GEN: well nourished female, in no acute distress  CV: regular rate and rhythm, no murmurs appreciated  RESP: no increased work of breathing, clear to ascultation bilaterally  ABD: Bowel sounds present. Soft, Nontender, Nondistended.  MSK: no lower extremity edema, or calf tenderness  SKIN: warm, dry, red scaly rash under bilateral breasts, upper extremities with bilateral maculopapular rash on the extensor surface  NEURO: grossly normal, moves all extremities appropriately PSYCH: Normal affect, appropriate speech and behavior    ASSESSMENT/PLAN:   Prediabetes Well controlled with diet.  A1c today 5.8 and previously 6.1. Encouraged continued diet rich in vegetables and complex carbs.  Heart healthy carb modified diet. Counseled on need to continue exercising.  Urine microalbumine: ordered today  Eye exam: asked patient to get eye exam  - BMP  - lipid panel   Skin rash Treat maculopapular rash on the extensor surfaces with Kenalog cream. DDx contact dermatitis given distrubution, scabies vs mites, not likely eczema or psoriasis    Candidiasis of breast intertriginous candidiasis - will treat with Ketoconazole cream  - Advised patient to keep area dry as much as possible    Healthcare Maintenance:  Discussed the following measures with the patient: - 150 minutes of exercise per week-try 30 minutes 5 days  per week - Reducing sugary beverages (like soda and juice) and increasing leafy greens and whole fruits.  - Avoiding tobacco and alcohol.  - Avoiding illicit substances.  - Your blood pressure is at/ or below goal.    Immunizations:  Shingrix  Rx sent to pharmacy.   Screening: Colonoscopy UTD, due Oct 2022. Mammogram UTD.  PAP UTD, HPV negative.  PHQ score 0;  reviewed and discussed. Blood pressure reviewed and at goal.  Asked about intimate partner violence and patient reports none. Advanced directives form provided.    Follow up in 1  year or sooner if indicated.   [name], DO PGY-2, [facility] 03/13/2020      [name], [place]

## 2020-03-14 LAB — BASIC METABOLIC PANEL
BUN/Creatinine Ratio: 12 (ref 9–23)
BUN: 8 mg/dL (ref 6–24)
CO2: 23 mmol/L (ref 20–29)
Calcium: 10.6 mg/dL — ABNORMAL HIGH (ref 8.7–10.2)
Chloride: 103 mmol/L (ref 96–106)
Creatinine, Ser: 0.65 mg/dL (ref 0.57–1.00)
GFR calc Af Amer: 112 mL/min/{1.73_m2} (ref >59)
GFR calc non Af Amer: 97 mL/min/{1.73_m2} (ref >59)
Glucose: 84 mg/dL (ref 65–99)
Potassium: 4.2 mmol/L (ref 3.5–5.2)
Sodium: 139 mmol/L (ref 134–144)

## 2020-03-14 LAB — LIPID PANEL
Chol/HDL Ratio: 2.2 ratio (ref 0.0–4.4)
Cholesterol, Total: 157 mg/dL (ref 100–199)
HDL: 71 mg/dL (ref >39)
LDL Chol Calc (NIH): 76 mg/dL (ref 0–99)
Triglycerides: 44 mg/dL (ref 0–149)
VLDL Cholesterol Cal: 10 mg/dL (ref 5–40)

## 2020-03-16 ENCOUNTER — Encounter: Payer: Self-pay | Admitting: Family Medicine

## 2020-03-16 ENCOUNTER — Telehealth: Payer: Self-pay

## 2020-03-16 DIAGNOSIS — R21 Rash and other nonspecific skin eruption: Secondary | ICD-10-CM | POA: Insufficient documentation

## 2020-03-16 DIAGNOSIS — B3789 Other sites of candidiasis: Secondary | ICD-10-CM | POA: Insufficient documentation

## 2020-03-16 NOTE — Telephone Encounter (Signed)
Patient calls nurse requesting results from recent lab work. Patient reports she saw them on mychart and would like to know the plan for calcium. Please advise.

## 2020-03-16 NOTE — Assessment & Plan Note (Addendum)
intertriginous candidiasis - will treat with Ketoconazole cream  - Advised patient to keep area dry as much as possible

## 2020-03-16 NOTE — Assessment & Plan Note (Addendum)
Well controlled with diet.  A1c today 5.8 and previously 6.1. Encouraged continued diet rich in vegetables and complex carbs.  Heart healthy carb modified diet. Counseled on need to continue exercising.  Urine microalbumine: ordered today  Eye exam: asked patient to get eye exam  - BMP  - lipid panel

## 2020-03-16 NOTE — Assessment & Plan Note (Addendum)
Treat maculopapular rash on the extensor surfaces with Kenalog cream. DDx contact dermatitis given distrubution, scabies vs mites, not likely eczema or psoriasis

## 2020-04-17 ENCOUNTER — Telehealth: Payer: 59 | Admitting: Nurse Practitioner

## 2020-04-17 DIAGNOSIS — H60333 Swimmer's ear, bilateral: Secondary | ICD-10-CM | POA: Diagnosis not present

## 2020-04-17 MED ORDER — POLYMYXIN B-TRIMETHOPRIM 10000-0.1 UNIT/ML-% OP SOLN
2.0000 [drp] | OPHTHALMIC | 0 refills | Status: DC
Start: 2020-04-17 — End: 2020-04-17

## 2020-04-17 MED ORDER — OFLOXACIN 0.3 % OT SOLN
5.0000 [drp] | Freq: Every day | OTIC | 0 refills | Status: DC
Start: 2020-04-17 — End: 2020-06-01

## 2020-04-17 NOTE — Progress Notes (Signed)
E Visit for Swimmer's Ear  We are sorry that you are not feeling well. Here is how we plan to help!  Based on what you have shared with me it looks like you have swimmers ear. Swimmer's ear is a redness or swelling, irritation, or infection of your outer ear canal.  These symptoms usually occur within a few days of swimming.  Your ear canal is a tube that goes from the opening of the ear to the eardrum.  When water stays in your ear canal, germs can grow.  This is a painful condition that often happens to children and swimmers of all ages.  It is not contagious and oral antibiotics are not required to treat uncomplicated swimmer's ear.  The usual symptoms include: Itching inside the ear, Redness or a sense of swelling in the ear, Pain when the ear is tugged on when pressure is placed on the ear, Pus draining from the infected ear. and I have prescribed: Neomycin 0.35%, polymyxin B 10,000 units/mL, and hydrocortisone 0,5% otic solution 4 drops in affected ears four times a day for 7 days    In certain cases swimmer's ear may progress to a more serious bacterial infection of the middle or inner ear.  If you have a fever 102 and up and significantly worsening symptoms, this could indicate a more serious infection moving to the middle/inner and needs face to face evaluation in an office by a provider.  Your symptoms should improve over the next 3 days and should resolve in about 7 days.  HOME CARE:   Wash your hands frequently.  Do not place the tip of the bottle on your ear or touch it with your fingers.  You can take Acetominophen 650 mg every 4-6 hours as needed for pain.  If pain is severe or moderate, you can apply a heating pad (set on low) or hot water bottle (wrapped in a towel) to outer ear for 20 minutes.  This will also increase drainage.  Avoid ear plugs  Do not use Q-tips  After showers, help the water run out by tilting your head to one side.  GET HELP RIGHT AWAY IF:   Fever  is over 102.2 degrees.  You develop progressive ear pain or hearing loss.  Ear symptoms persist longer than 3 days after treatment.  MAKE SURE YOU:   Understand these instructions.  Will watch your condition.  Will get help right away if you are not doing well or get worse.  TO PREVENT SWIMMER'S EAR:  Use a bathing cap or custom fitted swim molds to keep your ears dry.  Towel off after swimming to dry your ears.  Tilt your head or pull your earlobes to allow the water to escape your ear canal.  If there is still water in your ears, consider using a hairdryer on the lowest setting.  Thank you for choosing an e-visit. Your e-visit answers were reviewed by a board certified advanced clinical practitioner to complete your personal care plan. Depending upon the condition, your plan could have included both over the counter or prescription medications. Please review your pharmacy choice. Be sure that the pharmacy you have chosen is open so that you can pick up your prescription now.  If there is a problem you may message your provider in [place] to have the prescription routed to another pharmacy. Your safety is important to [place]. If you have drug allergies check your prescription carefully.  For the next 24 hours, you can  use MyChart to ask questions about today's visit, request a non-urgent call back, or ask for a work or school excuse from your e-visit provider. You will get an email in the next two days asking about your experience. I hope that your e-visit has been valuable and will speed your recovery.   5-10 minutes spent reviewing and documenting in chart.

## 2020-06-01 ENCOUNTER — Other Ambulatory Visit: Payer: Self-pay

## 2020-06-01 ENCOUNTER — Ambulatory Visit (INDEPENDENT_AMBULATORY_CARE_PROVIDER_SITE_OTHER): Payer: 59 | Admitting: Family Medicine

## 2020-06-01 ENCOUNTER — Encounter: Payer: Self-pay | Admitting: Family Medicine

## 2020-06-01 VITALS — BP 120/70 | HR 76 | Ht 62.0 in | Wt 175.0 lb

## 2020-06-01 DIAGNOSIS — M25511 Pain in right shoulder: Secondary | ICD-10-CM | POA: Diagnosis not present

## 2020-06-01 DIAGNOSIS — Z23 Encounter for immunization: Secondary | ICD-10-CM | POA: Diagnosis not present

## 2020-06-01 MED ORDER — TIZANIDINE HCL 4 MG PO TABS
4.0000 mg | ORAL_TABLET | Freq: Every day | ORAL | 0 refills | Status: DC
Start: 2020-06-01 — End: 2020-08-16

## 2020-06-01 NOTE — Progress Notes (Signed)
    SUBJECTIVE:   CHIEF COMPLAINT / HPI:   Pain on right side: Patient reports that the end of August, early September she believes she was sick with Covid.  At the time she had pretty significant cough.  She reports today that since that time she has had a pain in the right side of her back near her shoulder blade.  She has not tried any medications or remedies to make the pain get better.  She was hoping it would go away on its own.  The pain has not gotten better or worse, has stayed the same.  It is worse with moving her right arm and with lying on her back at night.  She says it feels like an achy bruise.  She denies any current cough, shortness of breath, unintentional weight loss, nausea, vomiting, diarrhea.  Denies any loss of sensation or function of her right arm.  She is left-hand dominant.  Health maintenance: Patient due for ophthalmology exam, foot exam, and flu vaccine  PERTINENT  PMH / PSH:  Patient Active Problem List   Diagnosis Date Noted  . Need for influenza vaccination 06/01/2020  . Skin rash 03/16/2020  . Candidiasis of breast 03/16/2020  . Screening breast examination 08/29/2019  . Trigger point of right shoulder region 08/13/2019  . Left lumbar radiculopathy 05/07/2019  . Greater trochanteric bursitis of left hip 05/07/2019  . Instability of right knee joint 01/08/2019  . Palpitations 11/09/2018  . Menometrorrhagia 03/13/2018  . Polyp of cervix 03/13/2018  . Refuses statin 03/13/2018  . H/O arthroscopy 10/20/2017  . Instability of left knee joint 07/05/2017  . Other bilateral secondary osteoarthritis of knee 11/26/2014  . Pes planus of both feet 06/20/2014  . Loss of transverse plantar arch 06/20/2014  . Prediabetes 12/23/2009  . Vitamin D deficiency 12/23/2009     OBJECTIVE:   BP 120/70   Pulse 76   Ht 5\' 2"  (1.575 m)   Wt 175 lb (79.4 kg)   LMP 08/14/2012   SpO2 98%   BMI 32.01 kg/m    Physical exam: General: Well-appearing patient, very  pleasant Respiratory: Comfortable work of breathing, speaking complete sentences Right Shoulder: Inspection reveals no obvious deformity, atrophy, or asymmetry. No bruising. No swelling Palpation is normal with no TTP over [facility] joint or bicipital groove. Full ROM in flexion, abduction, internal/external rotation NV intact distally Normal scapular function observed. Special Tests: Negative empty can, negative drop arm sign, 5/5 strength appreciated with arm extension and flexion, internal and external rotation, no scapular winging appreciated  A tender point to palpation appreciated to fifth right transverse process and head of right rib.  Performed osteopathic techniques including "Texas twist", counterstrain and myofascial release techniques to patient's tender point.  Patient tolerated the osteopathic manipulation well.    ASSESSMENT/PLAN:   Need for influenza vaccination Patient received flu vaccine today  Trigger point of right shoulder region Somatic dysfunction appreciated to right T5 transverse process involving head of fifth rib.  Osteopathic techniques such as myofascial release, "Texas twist", and counterstrain were used, patient tolerated it well. -Patient structured to perform stretches for this point at home -Patient given prescription for tizanidine which she can take at night as needed for muscle spasm, trigger points     [place]

## 2020-06-01 NOTE — Patient Instructions (Addendum)
Thank you for coming in to see [place] today! Please see below to review our plan for today's visit:  1. Take Tizanidine 1 tablet at night about 1 hour before bed time. You can take this as needed before bed time. It will help to relax the trigger point in your back. 2. You can use warm compresses or a heating pad to the area to help the muscles to relax.  3. Do your best to stretch out this area at least twice daily (reach and pull).   Please call the clinic at [phone] if your symptoms worsen or you have any concerns. It was our pleasure to serve you!   Dr. Milus Banister [facility]

## 2020-06-01 NOTE — Assessment & Plan Note (Signed)
Somatic dysfunction appreciated to right T5 transverse process involving head of fifth rib.  Osteopathic techniques such as myofascial release, "Texas twist", and counterstrain were used, patient tolerated it well. -Patient structured to perform stretches for this point at home -Patient given prescription for tizanidine which she can take at night as needed for muscle spasm, trigger points

## 2020-06-01 NOTE — Assessment & Plan Note (Signed)
Patient received flu vaccine today. 

## 2020-08-16 ENCOUNTER — Other Ambulatory Visit: Payer: Self-pay

## 2020-08-16 ENCOUNTER — Encounter (HOSPITAL_COMMUNITY): Payer: Self-pay

## 2020-08-16 ENCOUNTER — Ambulatory Visit (HOSPITAL_COMMUNITY)
Admission: EM | Admit: 2020-08-16 | Discharge: 2020-08-16 | Disposition: A | Discharge status: Home / Self Care | Payer: 59 | Attending: Emergency Medicine | Admitting: Emergency Medicine

## 2020-08-16 DIAGNOSIS — S39012A Strain of muscle, fascia and tendon of lower back, initial encounter: Secondary | ICD-10-CM | POA: Diagnosis not present

## 2020-08-16 MED ORDER — TIZANIDINE HCL 4 MG PO TABS
4.0000 mg | ORAL_TABLET | Freq: Four times a day (QID) | ORAL | 0 refills | Status: DC | PRN
Start: 2020-08-16 — End: 2020-12-21

## 2020-08-16 MED ORDER — IBUPROFEN 600 MG PO TABS
600.0000 mg | ORAL_TABLET | Freq: Four times a day (QID) | ORAL | 0 refills | Status: DC | PRN
Start: 2020-08-16 — End: 2021-01-12

## 2020-08-16 NOTE — ED Triage Notes (Signed)
Pt reports right side buttock pain x 3 days. Denies leg pain, back pain, abdominal pain, dysuria.

## 2020-08-16 NOTE — ED Provider Notes (Signed)
[place]    CSN: [id number] Arrival date & time: 08/16/20  1010      History   Chief Complaint Chief Complaint  Patient presents with  . Rectal Pain  . Buttock pain    HPI Grace Lyons is a 60 y.o. female.   HPI   60 year old female here for evaluation of right-sided back pain.  Patient reports that the pain is in her right low back where the back and buttock meet.  This is a longstanding issue for her.  She sees Dr. Hulan Saas for this, and has seen him for years.  Patient reports that she is try to get disability based off of this pain that she is been having.  Patient was prescribed a 10 mg ibuprofen and Flexeril which she says is not helping.  Patient is requesting a spinal injection.  Past Medical History:  Diagnosis Date  . Arthritis   . Cervical polyp   . Degenerative arthritis of knee, bilateral 12/09/2015   Injections 12/09/2015 Started Orthovisc left knee 02/03/2016   . Fibroid   . H/O left knee surgery 09/2017  . Knee pain, left 12/29/2010  . Menometrorrhagia   . Vitamin D deficiency     Patient Active Problem List   Diagnosis Date Noted  . Need for influenza vaccination 06/01/2020  . Skin rash 03/16/2020  . Candidiasis of breast 03/16/2020  . Screening breast examination 08/29/2019  . Trigger point of right shoulder region 08/13/2019  . Left lumbar radiculopathy 05/07/2019  . Greater trochanteric bursitis of left hip 05/07/2019  . Instability of right knee joint 01/08/2019  . Palpitations 11/09/2018  . Menometrorrhagia 03/13/2018  . Polyp of cervix 03/13/2018  . Refuses statin 03/13/2018  . H/O arthroscopy 10/20/2017  . Instability of left knee joint 07/05/2017  . Other bilateral secondary osteoarthritis of knee 11/26/2014  . Pes planus of both feet 06/20/2014  . Loss of transverse plantar arch 06/20/2014  . Prediabetes 12/23/2009  . Vitamin D deficiency 12/23/2009    Past Surgical History:  Procedure Laterality Date  .  COMBINED HYSTEROSCOPY DIAGNOSTIC / D&C    . ENDOMETRIAL BIOPSY  12/17/2008  . HYSTEROSCOPY WITH D & C  08/31/2012   Procedure: DILATATION AND CURETTAGE /HYSTEROSCOPY;  Surgeon: Delice Lesch, MD;  Location: [facility];  Service: Gynecology;  Laterality: N/A;  with resectoscope  . KNEE SURGERY    . TUBAL LIGATION  1995    OB History    Gravida  1   Para      Term      Preterm      AB      Living  1     SAB      IAB      Ectopic      Multiple      Live Births               Home Medications    Prior to Admission medications   Medication Sig Start Date End Date Taking? Authorizing Provider  ibuprofen (ADVIL) 600 MG tablet Take 1 tablet (600 mg total) by mouth every 6 (six) hours as needed. 08/16/20  Yes Margarette Canada, NP  tiZANidine (ZANAFLEX) 4 MG tablet Take 1 tablet (4 mg total) by mouth every 6 (six) hours as needed for muscle spasms. 08/16/20  Yes Margarette Canada, NP  Cholecalciferol (VITAMIN D3) 1000 UNITS CAPS Take 1,000 Units by mouth daily.     [provider]  Omega-3 Fatty Acids (  OMEGA 3 PO) Take 1 capsule by mouth daily.     [provider]  venlafaxine XR (EFFEXOR XR) 37.5 MG 24 hr capsule Take 1 capsule (37.5 mg total) by mouth daily with breakfast. 05/07/19   Judi Saa, DO  VITAMIN A PO Take by mouth.    [provider]    Family History Family History  Problem Relation Age of Onset  . Diabetes Mother   . Hypertension Mother   . Heart disease Mother   . Diabetes Father   . Hypertension Father   . Prostate cancer Father   . Osteoarthritis Brother     Social History Social History   Tobacco Use  . Smoking status: Former Smoker    Quit date: 01/12/1996    Years since quitting: 24.6  . Smokeless tobacco: Never Used  . Tobacco comment: Single, lives alone. Works as Archivist at [employer]. Has 16yo son at [employer]  Vaping Use  . Vaping Use: Never used  Substance Use Topics  . Alcohol use: No  .  Drug use: No     Allergies   Doxycycline and Lidocaine   Review of Systems Review of Systems  Constitutional: Negative for activity change and appetite change.  Musculoskeletal: Positive for back pain and myalgias. Negative for arthralgias.  Skin: Negative for color change and rash.  Neurological: Negative for weakness and numbness.  Hematological: Negative.   Psychiatric/Behavioral: Negative.      Physical Exam Triage Vital Signs ED Triage Vitals  Enc Vitals Group     BP 08/16/20 1046 126/76     Pulse Rate 08/16/20 1046 86     Resp 08/16/20 1046 18     Temp 08/16/20 1046 98.8 F (37.1 C)     Temp Source 08/16/20 1046 Oral     SpO2 08/16/20 1046 97 %     Weight --      Height --      Head Circumference --      Peak Flow --      Pain Score 08/16/20 1045 9     Pain Loc --      Pain Edu? --      Excl. in GC? --    No data found.  Updated Vital Signs BP 126/76 (BP Location: Right Arm)   Pulse 86   Temp 98.8 F (37.1 C) (Oral)   Resp 18   LMP 08/14/2012   SpO2 97%   Visual Acuity Right Eye Distance:   Left Eye Distance:   Bilateral Distance:    Right Eye Near:   Left Eye Near:    Bilateral Near:     Physical Exam Vitals and nursing note reviewed.  Constitutional:      General: She is not in acute distress.    Appearance: Normal appearance. She is not toxic-appearing.  HENT:     Head: Normocephalic and atraumatic.  Musculoskeletal:        General: Tenderness present. No swelling or deformity. Normal range of motion.  Skin:    General: Skin is warm and dry.     Capillary Refill: Capillary refill takes less than 2 seconds.  Neurological:     General: No focal deficit present.     Mental Status: She is alert and oriented to person, place, and time.  Psychiatric:        Mood and Affect: Mood normal.        Behavior: Behavior normal.  Thought Content: Thought content normal.        Judgment: Judgment normal.      UC Treatments / Results   Labs (all labs ordered are listed, but only abnormal results are displayed) Labs Reviewed - No data to display  EKG   Radiology No results found.  Procedures Procedures (including critical care time)  Medications Ordered in UC Medications - No data to display  Initial Impression / Assessment and Plan / UC Course  I have reviewed the triage vital signs and the nursing notes.  Pertinent labs & imaging results that were available during my care of the patient were reviewed by me and considered in my medical decision making (see chart for details).   Patient is here for evaluation of ongoing lower back pain.  Today her pain is on the right in the lower lumbar region.  There is significant spasm to palpation and trigger points in her low back in the right paraspinous region at the level of L5-S1.  Patient does not have a positive straight leg raise.  DP and PT pulses bilaterally are 2+.  Patient requesting a spinal injection and I informed her that we do not do that here.  Patient states that she will see her sports medicine doctor, Dr. Hulan Saas tomorrow.  Will discharge patient home on ibuprofen 6 mg and tizanidine 4 mg every 6 hours, give the patient back exercises to do, and encourage use of moist heat.   Final Clinical Impressions(s) / UC Diagnoses   Final diagnoses:  Lumbar strain, initial encounter     Discharge Instructions     Take the ibuprofen 600 mg and the tizanidine 4 mg on an every 6 hour schedule for the next 2 days to help overcome spasm and inflammation in your low back.  After the 2 days you can use the medications on an as-needed basis.  Follow the back exercises handout given with the discharge instructions.  Apply moist heat to your low back 2-3 times a day to help improve blood flow and relieve spasm and pain.  Follow-up with your sports medicine doctor if you do not have improvement.    ED Prescriptions    Medication Sig Dispense Auth. Provider    ibuprofen (ADVIL) 600 MG tablet Take 1 tablet (600 mg total) by mouth every 6 (six) hours as needed. 30 tablet Margarette Canada, NP   tiZANidine (ZANAFLEX) 4 MG tablet Take 1 tablet (4 mg total) by mouth every 6 (six) hours as needed for muscle spasms. 30 tablet Margarette Canada, NP     PDMP not reviewed this encounter.   Margarette Canada, NP 08/16/20 1114

## 2020-08-16 NOTE — Discharge Instructions (Addendum)
Take the ibuprofen 600 mg and the tizanidine 4 mg on an every 6 hour schedule for the next 2 days to help overcome spasm and inflammation in your low back.  After the 2 days you can use the medications on an as-needed basis.  Follow the back exercises handout given with the discharge instructions.  Apply moist heat to your low back 2-3 times a day to help improve blood flow and relieve spasm and pain.  Follow-up with your sports medicine doctor if you do not have improvement.

## 2020-08-17 ENCOUNTER — Ambulatory Visit (INDEPENDENT_AMBULATORY_CARE_PROVIDER_SITE_OTHER): Payer: 59 | Admitting: Family Medicine

## 2020-08-17 ENCOUNTER — Ambulatory Visit: Payer: Self-pay

## 2020-08-17 VITALS — BP 124/88 | HR 83 | Ht 62.0 in | Wt 171.2 lb

## 2020-08-17 DIAGNOSIS — M7918 Myalgia, other site: Secondary | ICD-10-CM

## 2020-08-17 DIAGNOSIS — S39012A Strain of muscle, fascia and tendon of lower back, initial encounter: Secondary | ICD-10-CM

## 2020-08-17 MED ORDER — METHYLPREDNISOLONE ACETATE 80 MG/ML IJ SUSP
80.0000 mg | Freq: Once | INTRAMUSCULAR | Status: AC
  Administered 2020-08-17: 80 mg via INTRAMUSCULAR

## 2020-08-17 MED ORDER — KETOROLAC TROMETHAMINE 30 MG/ML IJ SOLN
30.0000 mg | Freq: Once | INTRAMUSCULAR | Status: AC
  Administered 2020-08-17: 30 mg via INTRAMUSCULAR

## 2020-08-17 MED ORDER — PREDNISONE 10 MG PO TABS: 30.0000 mg | ORAL_TABLET | Freq: Every day | ORAL | 0 refills | Status: DC

## 2020-08-17 NOTE — Progress Notes (Signed)
I, Christoper Fabian, LAT, ATC, am serving as scribe for Dr. Clementeen Graham.  Grace Lyons is a 60 y.o. female who presents to [facility] at [facility] today for f/u of R-sided LBP / SIJ pain.  She was last seen by Dr. Denyse Amass on 12/10/19 for similar L-sided c/o and had a L iliac crest injection.  She was also prescribed Flexeril and referred to PT of which she did not attend any visits.  Since then, pt has had a recurrence of her LBP and went to the [facility] yesterday.  She locates her pain to bilat low back region. Pt could not recall any mechanism that caused the increased pain. No injury history.  Previous back episode was left-sided today's pain is mostly right-sided.  Patient notes that she has had intramuscular steroid and Toradol injections in the past that helped a lot.  Radiating pain: no LE numbness/tingling: no LE weakness: some- antalgic gait Aggravating factors: sitting, bending Treatments tried: IBU; Tizanidine;   Diagnostic imaging: L-spine and L hip XR - 05/07/19  Pertinent review of systems: No fevers or chills  Relevant historical information: Vitamin D deficiency.   Exam:  BP 124/88 (BP Location: Right Arm, Patient Position: Sitting, Cuff Size: Normal)   Pulse 83   Ht 5\' 2"  (1.575 m)   Wt 171 lb 3.2 oz (77.7 kg)   LMP 08/14/2012   SpO2 98%   BMI 31.31 kg/m  General: Well Developed, well nourished, and in no acute distress.   MSK: L-spine normal-appearing Nontender midline. Right lumbar paraspinal musculature tender and rigid. Decreased lumbar motion. Lower extremity strength is intact. Antalgic gait.    Lab and Radiology Results EXAM: LUMBAR SPINE - COMPLETE 4+ VIEW  COMPARISON:  CT 05/05/2019.  Lumbar spine 01/23/2017.  FINDINGS: Lumbar spine numbered the lowest segmented to partially segmented lumbar shaped vertebral body as L5. Diffuse multilevel degenerative change. Disc space loss and endplate osteophyte formation  most prominent L5-S1. 2 mm anterolisthesis L4 on L5. No interim change from prior exams. No acute abnormality identified.  IMPRESSION: Diffuse severe multilevel degenerative change again noted. Degenerative changes most prominent at L5-S1. 2 mm anterolisthesis L4 on L5. No interim change from prior exams. No acute abnormality.   Electronically Signed   By: 03/25/2017  Register   On: 05/08/2019 06:03  .I, 05/10/2019, personally (independently) visualized and performed the interpretation of the images attached in this note.     Assessment and Plan: 60 y.o. female with right low back pain ongoing for few days now.  Pain due to lumbosacral spasm and dysfunction.  Ultimately patient will improve with physical therapy and a bit of time.  However to manage her acute symptoms continue the tizanidine and ibuprofen.  Will administer intramuscular Toradol (30mg ) and Depo-Medrol (80mg ) injections today.  This should help in the somewhat short-term.  Discussed opiates.  Patient would like to defer for now but will prescribe in the future if needed.  Recommend also heating pad and TENS unit.  If not improving at all would consider repeat x-ray and ultimately MRI.   PDMP not reviewed this encounter. Orders Placed This Encounter  Procedures  . 46 LIMITED JOINT SPACE STRUCTURES LOW LEFT(NO LINKED CHARGES)    Standing Status:   Future    Number of Occurrences:   1    Standing Expiration Date:   02/14/2021    Order Specific Question:   Reason for Exam (SYMPTOM  OR DIAGNOSIS REQUIRED)  Answer:   left hip pain    Order Specific Question:   Preferred imaging location?    Answer:   [place]  . Ambulatory referral to Physical Therapy    Referral Priority:   Routine    Referral Type:   Physical Medicine    Referral Reason:   Specialty Services Required    Requested Specialty:   Physical Therapy   Meds ordered this encounter  Medications  . predniSONE (DELTASONE) 10 MG  tablet    Sig: Take 3 tablets (30 mg total) by mouth daily with breakfast.    Dispense:  15 tablet    Refill:  0     Discussed warning signs or symptoms. Please see discharge instructions. Patient expresses understanding.   The above documentation has been reviewed and is accurate and complete Lynne Leader, M.D.

## 2020-08-17 NOTE — Patient Instructions (Addendum)
Thank you for coming in today.  I've referred you to Physical Therapy.  Let us know if you don't hear from them in one week.  We will do injections today.   Take the back up prednisone starting Wednesday if needed.    If this is just not working let me know. I can add opiates short term.   I also recommend heating pad and TENS unit.   TENS UNIT: This is helpful for muscle pain and spasm.   Search and Purchase a TENS 7000 2nd edition at  www.tenspros.com or www.Amazon.com It should be less than $30.     TENS unit instructions: Do not shower or bathe with the unit on . Turn the unit off before removing electrodes or batteries . If the electrodes lose stickiness add a drop of water to the electrodes after they are disconnected from the unit and place on plastic sheet. If you continued to have difficulty, call the TENS unit company to purchase more electrodes. . Do not apply lotion on the skin area prior to use. Make sure the skin is clean and dry as this will help prolong the life of the electrodes. . After use, always check skin for unusual red areas, rash or other skin difficulties. If there are any skin problems, does not apply electrodes to the same area. . Never remove the electrodes from the unit by pulling the wires. . Do not use the TENS unit or electrodes other than as directed. . Do not change electrode placement without consultating your therapist or physician. Marland Kitchen Keep 2 fingers with between each electrode. . Wear time ratio is 2:1, on to off times.    For example on for 30 minutes off for 15 minutes and then on for 30 minutes off for 15 minutes

## 2020-08-18 ENCOUNTER — Telehealth: Payer: Self-pay | Admitting: Family Medicine

## 2020-08-18 MED ORDER — HYDROCODONE-ACETAMINOPHEN 5-325 MG PO TABS: 1.0000 | ORAL_TABLET | Freq: Four times a day (QID) | ORAL | 0 refills | Status: DC | PRN

## 2020-08-18 NOTE — Telephone Encounter (Signed)
Pt called again, is in pain and would like a call asap.

## 2020-08-18 NOTE — Telephone Encounter (Signed)
I spoke with Grace Lyons. She is having quite a bit of back pain. Will use limited Norco. Stressed PT.  Recheck if not improved.

## 2020-08-18 NOTE — Addendum Note (Signed)
Addended by: Rodolph Bong on: 08/18/2020 11:59 AM   Modules accepted: Orders

## 2020-08-18 NOTE — Telephone Encounter (Signed)
Pt seen yesterday, received shots in both buttocks yesterday. Her pain was originally on the R side but now has changed, it is on the L side and extremely sharp. She is very concerned and has limited mobility. [phone]

## 2020-10-19 ENCOUNTER — Other Ambulatory Visit: Payer: Self-pay

## 2020-10-19 ENCOUNTER — Ambulatory Visit: Payer: Self-pay

## 2020-10-19 ENCOUNTER — Encounter: Payer: Self-pay | Admitting: Family Medicine

## 2020-10-19 ENCOUNTER — Ambulatory Visit (INDEPENDENT_AMBULATORY_CARE_PROVIDER_SITE_OTHER): Payer: 59 | Admitting: Family Medicine

## 2020-10-19 VITALS — BP 130/68 | Ht 62.0 in | Wt 171.0 lb

## 2020-10-19 DIAGNOSIS — M76822 Posterior tibial tendinitis, left leg: Secondary | ICD-10-CM | POA: Diagnosis not present

## 2020-10-19 DIAGNOSIS — M79672 Pain in left foot: Secondary | ICD-10-CM

## 2020-10-19 MED ORDER — METHYLPREDNISOLONE ACETATE 40 MG/ML IJ SUSP
40.0000 mg | Freq: Once | INTRAMUSCULAR | Status: AC
  Administered 2020-10-19: 40 mg via INTRAMUSCULAR

## 2020-10-19 NOTE — Patient Instructions (Signed)
Nice to meet you Please continue the crutches  Please let me know in the next couple of days on how your pain is doing  Please send me a message in [place] with any questions or updates.  Please see me back at the end of this week if your pain isn't improving. Otherwise see me back 2 weeks.   --[name]

## 2020-10-19 NOTE — Progress Notes (Signed)
Grace Lyons - 60 y.o. female MRN [id number]  Date of birth: 04/12/1961  SUBJECTIVE:  Including CC & ROS.  No chief complaint on file.   Grace Lyons is a 60 y.o. female that is presenting with acute left leg pain.  She is having significant redness and swelling over the medial aspect of the midfoot.  No history of similar pain.  Pain is severe in nature.  No injury or inciting event.  No new or different medications.   Review of Systems See HPI   HISTORY: Past Medical, Surgical, Social, and Family History Reviewed & Updated per EMR.   Pertinent Historical Findings include:  Past Medical History:  Diagnosis Date  . Arthritis   . Cervical polyp   . Degenerative arthritis of knee, bilateral 12/09/2015   Injections 12/09/2015 Started Orthovisc left knee 02/03/2016   . Fibroid   . H/O left knee surgery 09/2017  . Knee pain, left 12/29/2010  . Menometrorrhagia   . Vitamin D deficiency     Past Surgical History:  Procedure Laterality Date  . COMBINED HYSTEROSCOPY DIAGNOSTIC / D&C    . ENDOMETRIAL BIOPSY  12/17/2008  . HYSTEROSCOPY WITH D & C  08/31/2012   Procedure: DILATATION AND CURETTAGE /HYSTEROSCOPY;  Surgeon: Delice Lesch, MD;  Location: [facility];  Service: Gynecology;  Laterality: N/A;  with resectoscope  . KNEE SURGERY    . TUBAL LIGATION  1995    Family History  Problem Relation Age of Onset  . Diabetes Mother   . Hypertension Mother   . Heart disease Mother   . Diabetes Father   . Hypertension Father   . Prostate cancer Father   . Osteoarthritis Brother     Social History   Socioeconomic History  . Marital status: Single    Spouse name: Not on file  . Number of children: Not on file  . Years of education: Not on file  . Highest education level: Bachelor's degree (e.g., BA, AB, BS)  Occupational History    Employer: [employer]  Tobacco Use  . Smoking status: Former Smoker    Quit date: 01/12/1996    Years since quitting: 24.7  . Smokeless  tobacco: Never Used  . Tobacco comment: Single, lives alone. 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 Vaping Use: Never used  Substance and Sexual Activity  . Alcohol use: No  . Drug use: No  . Sexual activity: Not Currently    Birth control/protection: Surgical    Comment: BTL  Other Topics Concern  . Not on file  Social History Narrative  . Not on file   Social Determinants of Health   Financial Resource Strain: Not on file  Food Insecurity: Not on file  Transportation Needs: Not on file  Physical Activity: Not on file  Stress: Not on file  Social Connections: Not on file  Intimate Partner Violence: Not on file     PHYSICAL EXAM:  VS: BP 130/68 (BP Location: Left Arm, Patient Position: Sitting, Cuff Size: Normal)   Ht 5\' 2"  (1.575 m)   Wt 171 lb (77.6 kg)   LMP 08/14/2012   BMI 31.28 kg/m  Physical Exam Gen: NAD, alert, cooperative with exam, well-appearing MSK:  Left foot: Tenderness palpation of the navicular. Redness and heat over the insertion of the posterior tibialis. Normal plantar flexion and dorsiflexion. Pain with inversion and eversion. Neurovascular intact  Limited  ultrasound: Left foot:  No ankle effusion. There appears to be an effusion around the posterior tibialis as it courses past the lateral malleolus and into the navicular. There is a calcified change at the insertion of the posterior tibialis with significant hyperemia around the navicular. There appears to be significant hyperemia overlying the medial cuneiform as well as the navicular medially.   No cobblestoning evident. No change at the base of the first metatarsal.   Summary: Findings would suggest inflammatory process at the insertion of the posterior tibialis  Ultrasound and interpretation by Clearance Coots, MD    ASSESSMENT & PLAN:   Posterior tibial tendinitis of left lower extremity Symptoms seem more inflammatory in  nature.  There does appear to be hyperechoic changes to suggest a gouty origin.  Seems less likely for an infectious origin. -Counseled on home exercise therapy and supportive care. -IM Depo. -May need to consider further work-up for inflammatory process -Have close follow-up to monitor if need for antibiotics. - crutches

## 2020-10-19 NOTE — Assessment & Plan Note (Addendum)
Symptoms seem more inflammatory in nature.  There does appear to be hyperechoic changes to suggest a gouty origin.  Seems less likely for an infectious origin. -Counseled on home exercise therapy and supportive care. -IM Depo. -May need to consider further work-up for inflammatory process -Have close follow-up to monitor if need for antibiotics. - crutches

## 2020-10-19 NOTE — Addendum Note (Signed)
Addended by: Cresenciano Lick on: 10/19/2020 02:41 PM   Modules accepted: Orders

## 2020-10-22 ENCOUNTER — Other Ambulatory Visit: Payer: Self-pay

## 2020-10-22 ENCOUNTER — Ambulatory Visit (INDEPENDENT_AMBULATORY_CARE_PROVIDER_SITE_OTHER): Payer: 59 | Admitting: Family Medicine

## 2020-10-22 VITALS — BP 128/80 | Ht 62.0 in | Wt 171.0 lb

## 2020-10-22 DIAGNOSIS — M76822 Posterior tibial tendinitis, left leg: Secondary | ICD-10-CM

## 2020-10-22 NOTE — Assessment & Plan Note (Signed)
80% improvement with prednisone.  Unclear if this is a pure inflammatory origin for more mechanical in nature. -Counseled on home exercise therapy and supportive care. -Cam walker with scaphoid pad. -ANA, sed rate, CRP and uric acid. -Counseled on ibuprofen.

## 2020-10-22 NOTE — Patient Instructions (Signed)
Good to see you Please start ibuprofen three times a day for 3-4 days straight  Please try the boot if having pain  I will call with the results from today   Please send me a message in [place] with any questions or updates.  Please see me back in 4 weeks.   --[name]

## 2020-10-22 NOTE — Progress Notes (Signed)
Grace Lyons - 60 y.o. female MRN [id number]  Date of birth: February 21, 1961  SUBJECTIVE:  Including CC & ROS.  No chief complaint on file.   Grace Lyons is a 60 y.o. female that is following up for left foot pain.  She is got about 80% improvement with the prednisone.  Still having some redness and tenderness over the insertion of the posterior tibialis at the navicular.  Able to use a cane instead of crutches today.   Review of Systems See HPI   HISTORY: Past Medical, Surgical, Social, and Family History Reviewed & Updated per EMR.   Pertinent Historical Findings include:  Past Medical History:  Diagnosis Date  . Arthritis   . Cervical polyp   . Degenerative arthritis of knee, bilateral 12/09/2015   Injections 12/09/2015 Started Orthovisc left knee 02/03/2016   . Fibroid   . H/O left knee surgery 09/2017  . Knee pain, left 12/29/2010  . Menometrorrhagia   . Vitamin D deficiency     Past Surgical History:  Procedure Laterality Date  . COMBINED HYSTEROSCOPY DIAGNOSTIC / D&C    . ENDOMETRIAL BIOPSY  12/17/2008  . HYSTEROSCOPY WITH D & C  08/31/2012   Procedure: DILATATION AND CURETTAGE /HYSTEROSCOPY;  Surgeon: Delice Lesch, MD;  Location: [facility];  Service: Gynecology;  Laterality: N/A;  with resectoscope  . KNEE SURGERY    . TUBAL LIGATION  1995    Family History  Problem Relation Age of Onset  . Diabetes Mother   . Hypertension Mother   . Heart disease Mother   . Diabetes Father   . Hypertension Father   . Prostate cancer Father   . Osteoarthritis Brother     Social History   Socioeconomic History  . Marital status: Single    Spouse name: Not on file  . Number of children: Not on file  . Years of education: Not on file  . Highest education level: Bachelor's degree (e.g., BA, AB, BS)  Occupational History    Employer: [employer]  Tobacco Use  . Smoking status: Former Smoker    Quit date: 01/12/1996    Years since quitting: 24.7  . Smokeless  tobacco: Never Used  . Tobacco comment: Single, lives alone. 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 Vaping Use: Never used  Substance and Sexual Activity  . Alcohol use: No  . Drug use: No  . Sexual activity: Not Currently    Birth control/protection: Surgical    Comment: BTL  Other Topics Concern  . Not on file  Social History Narrative  . Not on file   Social Determinants of Health   Financial Resource Strain: Not on file  Food Insecurity: Not on file  Transportation Needs: Not on file  Physical Activity: Not on file  Stress: Not on file  Social Connections: Not on file  Intimate Partner Violence: Not on file     PHYSICAL EXAM:  VS: BP 128/80 (BP Location: Left Arm, Patient Position: Sitting, Cuff Size: Large)   Ht 5\' 2"  (1.575 m)   Wt 171 lb (77.6 kg)   LMP 08/14/2012   BMI 31.28 kg/m  Physical Exam Gen: NAD, alert, cooperative with exam, well-appearing MSK:  Left foot: Redness and tenderness still appreciated at the navicular. Range of motion has improved. No streaking. Improvement of swelling of the foot. Neurovascular intact     ASSESSMENT & PLAN:  Posterior tibial tendinitis of left lower extremity 80% improvement with prednisone.  Unclear if this is a pure inflammatory origin for more mechanical in nature. -Counseled on home exercise therapy and supportive care. -Cam walker with scaphoid pad. -ANA, sed rate, CRP and uric acid. -Counseled on ibuprofen.

## 2020-10-23 ENCOUNTER — Telehealth: Payer: Self-pay | Admitting: *Deleted

## 2020-10-23 NOTE — Telephone Encounter (Signed)
Pt called requesting pain medication for her foot. She can not bare any weight and can't make it through the weekend with the pain. Please advise.

## 2020-10-23 NOTE — Telephone Encounter (Signed)
Has appt at 1:30 today.   Rosemarie Ax, MD [facility] 10/23/2020, 11:55 AM

## 2020-10-27 ENCOUNTER — Telehealth: Payer: Self-pay | Admitting: Family Medicine

## 2020-10-27 DIAGNOSIS — R768 Other specified abnormal immunological findings in serum: Secondary | ICD-10-CM

## 2020-10-27 LAB — SEDIMENTATION RATE: Sed Rate: 5 mm/hr (ref 0–40)

## 2020-10-27 LAB — ANA,IFA RA DIAG PNL W/RFLX TIT/PATN
ANA Titer 1: NEGATIVE
Cyclic Citrullin Peptide Ab: 118 units — ABNORMAL HIGH (ref 0–19)
Rheumatoid fact SerPl-aCnc: <10 IU/mL (ref <14.0)

## 2020-10-27 LAB — URIC ACID: Uric Acid: 3.6 mg/dL (ref 3.0–7.2)

## 2020-10-27 LAB — C-REACTIVE PROTEIN: CRP: 6 mg/L (ref 0–10)

## 2020-10-27 NOTE — Telephone Encounter (Signed)
Has had recent positive anti-CCP as well as inflammatory changes on ultrasound.  Will place referral to rheumatology at [facility].   Rosemarie Ax, MD [facility] 10/27/2020, 3:35 PM

## 2020-11-19 ENCOUNTER — Ambulatory Visit: Payer: 59 | Admitting: Family Medicine

## 2020-12-08 ENCOUNTER — Other Ambulatory Visit: Payer: Self-pay | Admitting: Family Medicine

## 2020-12-08 DIAGNOSIS — Z1231 Encounter for screening mammogram for malignant neoplasm of breast: Secondary | ICD-10-CM

## 2020-12-21 ENCOUNTER — Encounter: Payer: Self-pay | Admitting: Family Medicine

## 2020-12-21 ENCOUNTER — Ambulatory Visit: Payer: 59 | Admitting: Family Medicine

## 2020-12-21 ENCOUNTER — Ambulatory Visit (INDEPENDENT_AMBULATORY_CARE_PROVIDER_SITE_OTHER): Payer: 59 | Admitting: Family Medicine

## 2020-12-21 ENCOUNTER — Other Ambulatory Visit: Payer: Self-pay

## 2020-12-21 VITALS — BP 130/80 | HR 72 | Ht 62.0 in | Wt 174.0 lb

## 2020-12-21 DIAGNOSIS — M546 Pain in thoracic spine: Secondary | ICD-10-CM | POA: Diagnosis not present

## 2020-12-21 MED ORDER — TIZANIDINE HCL 4 MG PO TABS: 4.0000 mg | ORAL_TABLET | Freq: Three times a day (TID) | ORAL | 1 refills | Status: DC | PRN

## 2020-12-21 MED ORDER — KETOROLAC TROMETHAMINE 30 MG/ML IJ SOLN
30.0000 mg | Freq: Once | INTRAMUSCULAR | Status: AC
  Administered 2020-12-21: 30 mg via INTRAMUSCULAR

## 2020-12-21 NOTE — Patient Instructions (Addendum)
Thank you for coming in today.  I've referred you to Physical Therapy.  Let us know if you don't hear from them in one week.  Heating pad and TENS unit may help.   Muscle relaxer.   Toradol shot today.

## 2020-12-21 NOTE — Progress Notes (Signed)
   I, [name], LAT, ATC acting as a scribe for Grace Leader, MD.  Grace Lyons is a 60 y.o. female who presents to [place] at [facility] today for back pain.  Pt was previously seen by [name] on 08/17/20 for a R-sided lumbosacral strain after being seen in the ED the day prior. Pt was given a Toradol and Depo IM injection and advised to cont tizanidine, IBU, heating pad, and TENS unit. Pt was referred to PT but did not attend. Of note, pt was also previously seen by [name] on 10/22/20 for posterior tibial tendonopathy. Today, pt locates pain to her upper back. States there was a lump in the upper back that went away but the pain is still there. States the pain throbs. Flexion causes increased pain. Has tried heat, topical medication and ice. Pain is not as bad as it was the first 3 days. Wants to know if it is rheumatoid arthritis.   No radiating pain weakness or numbness distally.  Pertinent review of systems: No fevers or chills  Relevant historical information: Rheumatoid arthritis   Exam:  BP 130/80 (BP Location: Left Arm, Patient Position: Sitting)   Pulse 72   Ht 5\' 2"  (1.575 m)   Wt 174 lb (78.9 kg)   LMP 08/14/2012   SpO2 98%   BMI 31.83 kg/m  General: Well Developed, well nourished, and in no acute distress.   MSK: C-spine normal. Nontender midline. Normal cervical motion. T-spine normal. Nontender midline. Tender palpation left thoracic paraspinal musculature.    Assessment and Plan: 60 y.o. female with pain thoracic spine paraspinal musculature.  Pain due to muscle spasm and dysfunction of the paraspinal muscles or of the rhomboid.  Patient is an excellent candidate for physical therapy.  Also prescribed low-dose tizanidine.  Limited Toradol injection in clinic today prior to discharge.  Recheck in about 4 to 6 weeks.  Return sooner if needed. Additionally discussed that today's pain is unlikely to be directly related to her rheumatoid  arthritis.   PDMP not reviewed this encounter. Orders Placed This Encounter  Procedures  . Ambulatory referral to Physical Therapy    Referral Priority:   Routine    Referral Type:   Physical Medicine    Referral Reason:   Specialty Services Required    Requested Specialty:   Physical Therapy   Meds ordered this encounter  Medications  . tiZANidine (ZANAFLEX) 4 MG tablet    Sig: Take 1 tablet (4 mg total) by mouth every 8 (eight) hours as needed for muscle spasms.    Dispense:  30 tablet    Refill:  1  . ketorolac (TORADOL) 30 MG/ML injection 30 mg     Discussed warning signs or symptoms. Please see discharge instructions. Patient expresses understanding.   The above documentation has been reviewed and is accurate and complete Grace Lyons, M.D.

## 2020-12-24 ENCOUNTER — Telehealth: Payer: Self-pay | Admitting: Family Medicine

## 2020-12-24 NOTE — Telephone Encounter (Signed)
Patient called asking if a letter could be written stating why she would be eligible for disability. I asked her if there were forms that she needed to be completed but she said that her lawyer told her that he just needed a "couple paragraphs". She states that she has continuing issues and is not able to walk or do things like she used to.   Please advise.  Lawyer: Mali Brown Fax # [phone]

## 2020-12-25 ENCOUNTER — Ambulatory Visit (INDEPENDENT_AMBULATORY_CARE_PROVIDER_SITE_OTHER): Payer: 59 | Admitting: Physical Therapy

## 2020-12-25 ENCOUNTER — Encounter: Payer: Self-pay | Admitting: Physical Therapy

## 2020-12-25 ENCOUNTER — Other Ambulatory Visit: Payer: Self-pay

## 2020-12-25 DIAGNOSIS — R293 Abnormal posture: Secondary | ICD-10-CM

## 2020-12-25 DIAGNOSIS — M546 Pain in thoracic spine: Secondary | ICD-10-CM | POA: Diagnosis not present

## 2020-12-25 DIAGNOSIS — M25572 Pain in left ankle and joints of left foot: Secondary | ICD-10-CM

## 2020-12-25 DIAGNOSIS — M6281 Muscle weakness (generalized): Secondary | ICD-10-CM | POA: Diagnosis not present

## 2020-12-25 NOTE — Therapy (Signed)
[place] [place], [place], [address] Phone: [phone]   Fax:  [phone]  Physical Therapy Evaluation  Patient Details  Name: Grace Lyons MRN: [id number] Date of Birth: 01/18/1961 Referring Provider (PT): Gregor Hams, MD   Encounter Date: 12/25/2020   PT End of Session - 12/25/20 0926    Visit Number 1    Number of Visits 12    Date for PT Re-Evaluation 02/05/21    PT Start Time 0842    PT Stop Time 0930    PT Time Calculation (min) 48 min    Activity Tolerance Patient tolerated treatment well    Behavior During Therapy [facility] for tasks assessed/performed           Past Medical History:  Diagnosis Date  . Arthritis   . Cervical polyp   . Degenerative arthritis of knee, bilateral 12/09/2015   Injections 12/09/2015 Started Orthovisc left knee 02/03/2016   . Fibroid   . H/O left knee surgery 09/2017  . Knee pain, left 12/29/2010  . Menometrorrhagia   . Vitamin D deficiency     Past Surgical History:  Procedure Laterality Date  . COMBINED HYSTEROSCOPY DIAGNOSTIC / D&C    . ENDOMETRIAL BIOPSY  12/17/2008  . HYSTEROSCOPY WITH D & C  08/31/2012   Procedure: DILATATION AND CURETTAGE /HYSTEROSCOPY;  Surgeon: Delice Lesch, MD;  Location: [facility];  Service: Gynecology;  Laterality: N/A;  with resectoscope  . KNEE SURGERY    . TUBAL LIGATION  1995    There were no vitals filed for this visit.    Subjective Assessment - 12/25/20 0843    Subjective She relays pain in thoraic pain on left side that started 2 weeks ago, she relays she has been diagonosed with OA and RA. She does not recall any specific injury but does say the pain could be from poor posture as she has not been working and has been sitting a lot. She also relays she had PT referral for about 4 weeks ago for ankle but did not go because it got better and now her ankle/foot is still bothering her and would like to work on this some while she is here for her  thoracic. She states she had MRI on it  which shows some arthritis but not sure what else. PT does not have access to this so asked her to bring in MRI report.    Pertinent History PMH:OA, Lt knee surgery 5-6 years ago    Limitations Sitting    Diagnostic tests no recent imaging in chart, she states she had recent MRI on Lt ankle so she was asked to bring a copy next time    Patient Stated Goals get the pain down    Currently in Pain? Yes    Pain Score 7    also pain 3-4 in her Lt ankle   Pain Location Thoracic    Pain Orientation Left    Pain Descriptors / Indicators Aching;Throbbing    Pain Type Acute pain    Pain Radiating Towards denies N/T    Pain Onset 1 to 4 weeks ago    Pain Frequency Intermittent    Aggravating Factors  vacuum, flexing forward, sitting for thoracic, walking more than 10 minutes for her Lt ankle    Pain Relieving Factors pressure to her back, meds, heat              [facility] PT Assessment - 12/25/20 0001  Assessment   Medical Diagnosis M54.6 (ICD-10-CM) - Pain in thoracic spine    Referring Provider (PT) Gregor Hams, MD    Onset Date/Surgical Date --   2 week onset of back pain, 4 week onset of ankle pain   Next MD Visit nothing set up with referring MD      Balance Screen   Has the patient fallen in the past 6 months No    Has the patient had a decrease in activity level because of a fear of falling?  No    Is the patient reluctant to leave their home because of a fear of falling?  No      Home Ecologist residence      Prior Function   Level of Independence Independent    Leisure play with her 3 dogs      Cognition   Overall Cognitive Status Within Functional Limits for tasks assessed      Observation/Other Assessments   Focus on Therapeutic Outcomes (FOTO)  36% functional intake, goal is 60%      Posture/Postural Control   Posture Comments slumped posture      ROM / Strength   AROM / PROM / Strength  AROM;Strength      AROM   Overall AROM Comments lumbar-thoracic ROM all WFL except rotation and sidebend to Left limited to 50% and painful in left thoracic. Lt ankle ROM WFL except tight with DF limited by 25%      Strength   Overall Strength Comments Lt ankle strength grossly 4/5 MMT      Special Tests   Other special tests negative SLR test, pain in left thoracic paraspinals, parascapular muscles      Transfers   Transfers Independent with all Transfers      Ambulation/Gait   Gait Comments independent community ambulator but limited by Left ankle pain to 10 minutes of walking and does have less weight shift to Lt LE, wears ASO brace now which she says helps                      Objective measurements completed on examination: See above findings.       [facility] Adult PT Treatment/Exercise - 12/25/20 0001      Modalities   Modalities Moist Heat      Moist Heat Therapy   Number Minutes Moist Heat 8 Minutes    Moist Heat Location --   thoracic     Manual Therapy   Manual therapy comments STM and IASTM with cupping to left thoracic and parascapular muscles 10 minutes                  PT Education - 12/25/20 0925    Education Details HEP,POC    Person(s) Educated Patient    Methods Explanation;Demonstration;Verbal cues;Handout    Comprehension Verbalized understanding;Need further instruction            PT Short Term Goals - 12/25/20 0941      PT SHORT TERM GOAL #1   Title Pt will be I and compliant with HEP    Time 3    Period Weeks    Status New    Target Date 01/15/21             PT Long Term Goals - 12/25/20 0941      PT LONG TERM GOAL #1   Title Pt will improve FOTO goal to 60% functional  Time 6    Period Weeks    Status New    Target Date 02/05/21      PT LONG TERM GOAL #2   Title Pt will improve thoracic ROM to [facility] with less than 2/10 pain    Time 6    Period Weeks    Status New      PT LONG TERM GOAL #3   Title Pt  will reduce thoracic pain and ankle pain to overall no more than 3/10 with ususal ADL's, and ambulation including walking her dogs.    Time 6    Period Weeks    Status New                  Plan - 12/25/20 7867    Clinical Impression Statement She presents with signs and symptoms consistent with acute Lt sided thoracic pain/strain. MD impression is" Pain due to muscle spasm and dysfunction of the paraspinal muscles or of the rhomboid and that Patient is an excellent candidate for physical therapy". She also relays she has seperate PT referral for Lt ankle which was evaluated today as well with signs of symptoms consistent with OA and posterior tib tendonitis. I will add both of these into her plan of care but our main focus will be on her thoracic pain since this is her biggest concern at this time.    Personal Factors and Comorbidities Comorbidity 2    Comorbidities PMH:OA, RA, Lt knee surgery.    Examination-Activity Limitations Bend;Lift;Carry;Locomotion Level;Reach Overhead    Examination-Participation Restrictions Cleaning;Laundry    Stability/Clinical Decision Making Evolving/Moderate complexity    Clinical Decision Making Moderate    Rehab Potential Good    PT Frequency 2x / week   1-2   PT Duration 6 weeks    PT Treatment/Interventions ADLs/Self Care Home Management;Cryotherapy;Electrical Stimulation;Iontophoresis 4mg /ml Dexamethasone;Moist Heat;Traction;Ultrasound;Therapeutic activities;Therapeutic exercise;Neuromuscular re-education;Manual techniques;Passive range of motion;Dry needling;Taping;Vasopneumatic Device;Spinal Manipulations;Joint Manipulations    PT Next Visit Plan review and update HEP PRN. consider manual therapy for STM, T.P. relase, DN, spinal mobs/manip. Consider modalaties PRN    PT Home Exercise Plan Access Code: [email or web address]    Consulted and Agree with Plan of Care Patient           Patient will benefit from skilled therapeutic intervention in order to  improve the following deficits and impairments:  Decreased activity tolerance,Decreased mobility,Decreased range of motion,Decreased strength,Difficulty walking,Postural dysfunction,Impaired flexibility,Increased muscle spasms,Increased fascial restricitons,Pain,Improper body mechanics  Visit Diagnosis: Pain in thoracic spine  Abnormal posture  Muscle weakness (generalized)  Pain in left ankle and joints of left foot     Problem List Patient Active Problem List   Diagnosis Date Noted  . Posterior tibial tendinitis of left lower extremity 10/19/2020  . Need for influenza vaccination 06/01/2020  . Skin rash 03/16/2020  . Candidiasis of breast 03/16/2020  . Screening breast examination 08/29/2019  . Trigger point of right shoulder region 08/13/2019  . Left lumbar radiculopathy 05/07/2019  . Greater trochanteric bursitis of left hip 05/07/2019  . Instability of right knee joint 01/08/2019  . Palpitations 11/09/2018  . Menometrorrhagia 03/13/2018  . Polyp of cervix 03/13/2018  . Refuses statin 03/13/2018  . H/O arthroscopy 10/20/2017  . Instability of left knee joint 07/05/2017  . Other bilateral secondary osteoarthritis of knee 11/26/2014  . Pes planus of both feet 06/20/2014  . Loss of transverse plantar arch 06/20/2014  . Prediabetes 12/23/2009  . Vitamin D deficiency 12/23/2009    Silvestre Mesi 12/25/2020,  9:48 AM  [facility] [address] [place], [place], [address] Phone: [phone]   Fax:  [phone]  Name: MAGDELENE RUARK MRN: [id number] Date of Birth: 1961-01-02

## 2020-12-25 NOTE — Patient Instructions (Signed)
Access Code: [email or web address] URL: [email or web address] Date: 12/25/2020 Prepared by: Elsie Ra  Exercises Seated Thoracic Lumbar Extension - 3-5 x daily - 6 x weekly - 1-2 sets - 10 reps Seated Scapular Retraction with External Rotation - 3-5 x daily - 6 x weekly - 3 sets - 10 reps Supine Thoracic Mobilization Foam Roll Horizontal with Arm Stretch - 1 x daily - 6 x weekly - 1-2 sets - 10 reps Sidelying Thoracic Lumbar Rotation - 1 x daily - 6 x weekly - 1-2 sets - 10 reps - 5 hold Cat-Camel to Child's Pose - 1 x daily - 6 x weekly - 1-2 sets - 10 reps Heel Toe Raises with Counter Support - 2 x daily - 6 x weekly - 2 sets - 10 reps Standing Gastroc Stretch - 2 x daily - 6 x weekly - 1 sets - 3 reps - 30 hold Standing Tandem Balance with Counter Support - 2 x daily - 6 x weekly - 1 sets - 3 reps - 30 hold

## 2020-12-26 ENCOUNTER — Other Ambulatory Visit: Payer: Self-pay | Admitting: Physician Assistant

## 2020-12-26 DIAGNOSIS — M7672 Peroneal tendinitis, left leg: Secondary | ICD-10-CM

## 2020-12-26 DIAGNOSIS — M05772 Rheumatoid arthritis with rheumatoid factor of left ankle and foot without organ or systems involvement: Secondary | ICD-10-CM

## 2020-12-26 DIAGNOSIS — M76822 Posterior tibial tendinitis, left leg: Secondary | ICD-10-CM

## 2020-12-26 DIAGNOSIS — M659 Synovitis and tenosynovitis, unspecified: Secondary | ICD-10-CM

## 2020-12-29 NOTE — Telephone Encounter (Signed)
I would like to do a good job with this task. However in order to be very specific I would advise you to schedule a follow-up appointment so that we can sit down together and talk about all of the issues that are causing you to be disabled in detail.  In general the more detail I can provide in a letter to the judge the better the letter will be.  This is important to get as correct as I can make it.  Recommend schedule a visit for this dedicated purpose.

## 2020-12-29 NOTE — Progress Notes (Signed)
   I, Peterson Lombard, LAT, ATC acting as a scribe for Lynne Leader, MD.  Grace Lyons is a 60 y.o. female who presents to [place] at [facility] today for disability planning/discussion due to thoracic back pain. Pt was last seen by [name] on 12/21/20 and was given a Toradol injection, prescribed low-dose tizanidine, and referred to PT of which she's completed 1 visits. Today, pt reports she is still having the throbbing pain. Pt c/o increased pain after PT session x 3 days. Pt is concerned there is a tumor or something growing in her back.   She is also applying for disability.  She has problems with her back ankles and knees.  She has difficulty walking and sitting for prolonged period of time.  She has been unable to work since April 2020.  She would like me to write a letter to support her disability claim.  Dx imaging: 05/07/19 L-spine XR  Pertinent review of systems: No fevers or chills  Relevant historical information: Vitamin D deficiency   Exam:  BP 118/76 (BP Location: Left Arm, Patient Position: Sitting, Cuff Size: Normal)   Pulse 82   Ht 5\' 2"  (1.575 m)   Wt 176 lb 3.2 oz (79.9 kg)   LMP 08/14/2012   SpO2 97%   BMI 32.23 kg/m  General: Well Developed, well nourished, and in no acute distress.   MSK: T-spine normal-appearing nontender midline.  Normal thoracic motion.    Lab and Radiology Results \ X-ray images T-spine obtained today personally and independently interpreted No acute fractures are visible.  Mild degenerative changes present. Await formal radiology review   Assessment and Plan: 60 y.o. female with thoracic back pain.  Thought to be muscle spasm and dysfunction.  Recommend continued PT.  Letter written to support disability claim.  Patient has been receiving ongoing sports medicine care since 2019 from Dr. Tamala Julian, [name], and myself.  She has been unable to work since 2020 and has significant difficulties with activities of  daily living and at work.  Reasonable disability claim.  Letter written today collaboratively with patient in clinic.   PDMP not reviewed this encounter. Orders Placed This Encounter  Procedures  . DG Thoracic Spine W/Swimmers    Standing Status:   Future    Number of Occurrences:   1    Standing Expiration Date:   12/30/2021    Order Specific Question:   Reason for Exam (SYMPTOM  OR DIAGNOSIS REQUIRED)    Answer:   thoracic back pain    Order Specific Question:   Is patient pregnant?    Answer:   No    Order Specific Question:   Preferred imaging location?    Answer:   [facility]   No orders of the defined types were placed in this encounter.    Discussed warning signs or symptoms. Please see discharge instructions. Patient expresses understanding.   The above documentation has been reviewed and is accurate and complete Lynne Leader, M.D. Total encounter time 30 minutes including face-to-face time with the patient and, reviewing past medical record, and charting on the date of service.   Discussed treatment plan and options

## 2020-12-29 NOTE — Telephone Encounter (Signed)
Appointment scheduled.

## 2020-12-30 ENCOUNTER — Encounter: Payer: Self-pay | Admitting: Family Medicine

## 2020-12-30 ENCOUNTER — Other Ambulatory Visit: Payer: Self-pay

## 2020-12-30 ENCOUNTER — Ambulatory Visit (INDEPENDENT_AMBULATORY_CARE_PROVIDER_SITE_OTHER): Payer: 59 | Admitting: Family Medicine

## 2020-12-30 ENCOUNTER — Ambulatory Visit (INDEPENDENT_AMBULATORY_CARE_PROVIDER_SITE_OTHER): Payer: 59

## 2020-12-30 VITALS — BP 118/76 | HR 82 | Ht 62.0 in | Wt 176.2 lb

## 2020-12-30 DIAGNOSIS — M546 Pain in thoracic spine: Secondary | ICD-10-CM

## 2020-12-30 NOTE — Patient Instructions (Signed)
Thank you for coming in today.  Give PT another try.   Get xray today.   Recheck in 1 month.

## 2021-01-01 NOTE — Progress Notes (Signed)
Left thoracic spine shows mild thoracic arthritis changes

## 2021-01-05 NOTE — Progress Notes (Signed)
Office Visit Note  Patient: Grace Lyons             Date of Birth: January 24, 1961           MRN: [id number]             PCP: [name], DO Referring: Rosemarie Ax, MD Visit Date: 01/19/2021 Occupation: @GUAROCC @  Subjective:  Left ankle pain and swelling.   History of Present Illness: Grace Lyons is a 60 y.o. female seen in consultation per request of [name].  According to the patient her symptoms a started with bilateral knee joint pain several years ago.  She used to see Dr. Lynann Bologna and had cortisone injections followed by gel injections.  She underwent left knee joint meniscal tear repair about 5 years ago by Dr. Maureen Ralphs.  He had a good response to the surgery.  She also had physical therapy and had been wearing a brace.  Although she continues to have discomfort in her knee joints.  She was also diagnosed with flatfeet several years ago.  She has always had discomfort in her feet.  Since March 2022 she has been having pain and swelling in her left foot.  She states it was to the point that she had difficulty walking.  She was initially seen by sports medicine in [facility] and had some blood work which was abnormal.  Then she went to [facility] office where she was given a cast and she was in a cast for about 3 to 4 weeks followed by a boot on as needed basis.  She still has difficulty walking due to left ankle pain.  She had MRI done on her ankle at [facility] office.  She states she was told she had a lot of arthritis.  She also has history of lower back pain and degenerative disc disease for many years.  She has had intramuscular injections in the past.  She has been going through physical therapy for her lower back currently.  None of the other joints are painful.  She has noticed decreased grip strength in her hands.  There is no family history of autoimmune disease.  He is gravida 1, para 1, miscarriages 0.  Activities of Daily Living:  Patient reports morning  stiffness for 5-8 minutes.   Patient Reports nocturnal pain.  Difficulty dressing/grooming: Reports Difficulty climbing stairs: Reports Difficulty getting out of chair: Denies Difficulty using hands for taps, buttons, cutlery, and/or writing: Denies  Review of Systems  Constitutional: Positive for fatigue.  HENT: Negative for mouth sores, mouth dryness and nose dryness.   Eyes: Negative for pain, itching and dryness.  Respiratory: Negative for shortness of breath and difficulty breathing.   Cardiovascular: Negative for chest pain and palpitations.  Gastrointestinal: Negative for blood in stool, constipation and diarrhea.  Endocrine: Negative for increased urination.  Genitourinary: Negative for difficulty urinating.  Musculoskeletal: Positive for arthralgias, joint pain, myalgias, muscle weakness, morning stiffness, muscle tenderness and myalgias. Negative for joint swelling.  Skin: Negative for color change, rash, redness and sensitivity to sunlight.  Allergic/Immunologic: Negative for susceptible to infections.  Neurological: Positive for numbness. Negative for dizziness, headaches, memory loss and weakness.  Hematological: Positive for bruising/bleeding tendency. Negative for swollen glands.  Psychiatric/Behavioral: Negative for behavioral problems, confusion and sleep disturbance. The patient is not nervous/anxious.     PMFS History:  Patient Active Problem List   Diagnosis Date Noted  . Posterior tibial tendinitis of left lower extremity 10/19/2020  .  Need for influenza vaccination 06/01/2020  . Skin rash 03/16/2020  . Candidiasis of breast 03/16/2020  . Screening breast examination 08/29/2019  . Trigger point of right shoulder region 08/13/2019  . Left lumbar radiculopathy 05/07/2019  . Greater trochanteric bursitis of left hip 05/07/2019  . Instability of right knee joint 01/08/2019  . Palpitations 11/09/2018  . Menometrorrhagia 03/13/2018  . Polyp of cervix 03/13/2018   . Refuses statin 03/13/2018  . H/O arthroscopy 10/20/2017  . Instability of left knee joint 07/05/2017  . Other bilateral secondary osteoarthritis of knee 11/26/2014  . Pes planus of both feet 06/20/2014  . Loss of transverse plantar arch 06/20/2014  . Prediabetes 12/23/2009  . Vitamin D deficiency 12/23/2009    Past Medical History:  Diagnosis Date  . Arthritis   . Cervical polyp   . Degenerative arthritis of knee, bilateral 12/09/2015   Injections 12/09/2015 Started Orthovisc left knee 02/03/2016   . Fibroid   . H/O left knee surgery 09/2017  . Knee pain, left 12/29/2010  . Menometrorrhagia   . Vitamin D deficiency     Family History  Problem Relation Age of Onset  . Diabetes Mother   . Hypertension Mother   . Heart disease Mother   . Diabetes Father   . Hypertension Father   . Prostate cancer Father   . Osteoarthritis Brother   . Mental illness Son    Past Surgical History:  Procedure Laterality Date  . ENDOMETRIAL BIOPSY  12/17/2008  . HYSTEROSCOPY WITH D & C  08/31/2012   Procedure: DILATATION AND CURETTAGE /HYSTEROSCOPY;  Surgeon: Delice Lesch, MD;  Location: [facility];  Service: Gynecology;  Laterality: N/A;  with resectoscope  . KNEE SURGERY    . TUBAL LIGATION  1995   Social History   Social History Narrative  . Not on file   Immunization History  Administered Date(s) Administered  . Influenza,inj,Quad PF,6+ Mos 06/13/2017, 06/21/2018, 06/01/2020  . PFIZER(Purple Top)SARS-COV-2 Vaccination 10/25/2019, 11/15/2019  . Pneumococcal Conjugate-13 03/10/2017  . Pneumococcal Polysaccharide-23 02/25/2019  . Td 11/19/2008  . Tdap 02/25/2019     Objective: Vital Signs: BP 125/84 (BP Location: Right Arm, Patient Position: Sitting, Cuff Size: Normal)   Pulse 70   Resp 14   Ht $R'5\' 1"'se$  (1.549 m)   Wt 177 lb 3.2 oz (80.4 kg)   LMP 08/14/2012   BMI 33.48 kg/m    Physical Exam Vitals and nursing note reviewed.  Constitutional:      Appearance: She is  well-developed.  HENT:     Head: Normocephalic and atraumatic.  Eyes:     Conjunctiva/sclera: Conjunctivae normal.  Cardiovascular:     Rate and Rhythm: Normal rate and regular rhythm.     Heart sounds: Normal heart sounds.  Pulmonary:     Effort: Pulmonary effort is normal.     Breath sounds: Normal breath sounds.  Abdominal:     General: Bowel sounds are normal.     Palpations: Abdomen is soft.  Musculoskeletal:     Cervical back: Normal range of motion.  Lymphadenopathy:     Cervical: No cervical adenopathy.  Skin:    General: Skin is warm and dry.     Capillary Refill: Capillary refill takes less than 2 seconds.  Neurological:     Mental Status: She is alert and oriented to person, place, and time.  Psychiatric:        Behavior: Behavior normal.      Musculoskeletal Exam: C-spine was in good range of  motion.  She had discomfort range of motion of her thoracic and lumbar spine.  She had no tenderness over SI joints.  Shoulder joints, elbow joints, wrist joints, MCPs PIPs and DIPs with good range of motion with no synovitis.  Hip joints and knee joints with good range of motion without any warmth swelling or effusion.  There was no swelling over ankle joints.  She had bilateral pes planus and first MTP thickening.  CDAI Exam: CDAI Score: -- Patient Global: --; Provider Global: -- Swollen: --; Tender: -- Joint Exam 01/19/2021   No joint exam has been documented for this visit   There is currently no information documented on the homunculus. Go to the Rheumatology activity and complete the homunculus joint exam.  Investigation: No additional findings.  Imaging: DG Thoracic Spine W/Swimmers  Result Date: 01/01/2021 CLINICAL DATA:  Thoracic back pain, left-sided EXAM: THORACIC SPINE - 3 VIEWS COMPARISON:  None. FINDINGS: Endplate spurring primarily at T11-12. No evidence of fracture, subluxation, or bone lesion. Maintained posterior mediastinal fat planes. IMPRESSION: Mild  thoracic endplate spurring.  No acute or focal finding. Electronically Signed   By: Monte Fantasia M.D.   On: 01/01/2021 10:26    Recent Labs: Lab Results  Component Value Date   WBC 8.2 05/05/2019   HGB 13.8 05/05/2019   PLT 252 05/05/2019   NA 139 03/13/2020   K 4.2 03/13/2020   CL 103 03/13/2020   CO2 23 03/13/2020   GLUCOSE 84 03/13/2020   BUN 8 03/13/2020   CREATININE 0.65 03/13/2020   BILITOT 1.0 05/05/2019   ALKPHOS 65 05/05/2019   AST 23 05/05/2019   ALT 24 05/05/2019   PROT 7.3 05/05/2019   ALBUMIN 4.2 05/05/2019   CALCIUM 10.6 (H) 03/13/2020   GFRAA 112 03/13/2020    Speciality Comments: No specialty comments available.  Procedures:  No procedures performed Allergies: Doxycycline and Lidocaine   Assessment / Plan:     Visit Diagnoses: Positive anti-CCP test - 10/22/20: ANA-, RF<10, anti-CCP 118, ESR 5, CRP 6, uric acid 3.6.  Patient has positive anti-CCP antibody which can be associated with rheumatoid arthritis.  I detailed discussion with the patient regarding the lab results.  She had no synovitis on my examination today.  Although anti-CCP antibody could be seen several years prior to onset of rheumatoid arthritis.  I advised her to contact me in case she develops any increased joint pain or swelling.  DDD (degenerative disc disease), lumbar - Followed at the sports medicine.  She has had intermittent left-sided radiculopathy.  I reviewed x-ray of her lumbar spine from September 2020.  X-ray findings were discussed with the patient.  She is going to physical therapy currently.  Greater trochanteric bursitis of left hip-she has intermittent bursitis.  Primary osteoarthritis of knees, bilateral -she has known history of osteoarthritis in her knee joints.  I reviewed x-ray of her right knee joint from May 2020 and MRI of her right knee joint from May 2020.  She had meniscal tear but she does not want to have surgery for it.  She was having increased pain and  discomfort in her left knee joint and had arthroscopic surgery on her left knee joint by Dr. Maureen Ralphs in the past.  She continues to have discomfort in her bilateral knee joints.  No warmth swelling or effusion was noted.  A handout on knee joint exercises was given.  Posterior tibial tendinitis of left lower extremity - Followed at [facility].  She complains of  pain and discomfort in her left ankle joint to the point she has difficulty walking.  I requested MRI report from [facility] office which I reviewed.  It showed only degenerative changes and pes planus.  She also had inframalleolar peroneus brevis tendon tendinosis.  She has been wearing an ankle support which helps to some extent.  Although she still limps.  She states she has seen a podiatrist in the past and she could not tolerate the arch support as it was too firm.    Pes planus of both feet-she has severe by lateral pes planus.  She has difficulty walking.  I have advised her to get new shoes with arch support and also some insoles with arch support.  Some foot muscle strengthening exercises were emphasized.  Pain in both hands-she complains of decreased grip strength in her hands.  No synovitis was noted.  I offered x-rays of her bilateral hands but she declined.  I have given her a handout on hand exercises.  Vitamin D deficiency  Prediabetes  Orders: No orders of the defined types were placed in this encounter.  No orders of the defined types were placed in this encounter.    Follow-Up Instructions: Return if symptoms worsen or fail to improve.   Bo Merino, MD  Note - This record has been created using Editor, commissioning.  Chart creation errors have been sought, but may not always  have been located. Such creation errors do not reflect on  the standard of medical care.

## 2021-01-08 ENCOUNTER — Ambulatory Visit (INDEPENDENT_AMBULATORY_CARE_PROVIDER_SITE_OTHER): Payer: 59 | Admitting: Rehabilitative and Restorative Service Providers"

## 2021-01-08 ENCOUNTER — Other Ambulatory Visit: Payer: Self-pay

## 2021-01-08 ENCOUNTER — Encounter: Payer: Self-pay | Admitting: Rehabilitative and Restorative Service Providers"

## 2021-01-08 DIAGNOSIS — R600 Localized edema: Secondary | ICD-10-CM

## 2021-01-08 DIAGNOSIS — M6281 Muscle weakness (generalized): Secondary | ICD-10-CM

## 2021-01-08 DIAGNOSIS — R293 Abnormal posture: Secondary | ICD-10-CM

## 2021-01-08 DIAGNOSIS — R262 Difficulty in walking, not elsewhere classified: Secondary | ICD-10-CM | POA: Diagnosis not present

## 2021-01-08 DIAGNOSIS — M546 Pain in thoracic spine: Secondary | ICD-10-CM

## 2021-01-08 DIAGNOSIS — M25572 Pain in left ankle and joints of left foot: Secondary | ICD-10-CM

## 2021-01-08 NOTE — Patient Instructions (Signed)
Added activities to HEP (see flow sheet)

## 2021-01-08 NOTE — Therapy (Signed)
[facility] [address] [place], [place], [address] Phone: [phone]   Fax:  [phone]  Physical Therapy Treatment  Patient Details  Name: Grace Lyons MRN: [id number] Date of Birth: 05-17-1961 Referring Provider (PT): Gregor Hams, MD   Encounter Date: 01/08/2021   PT End of Session - 01/08/21 1645    Visit Number 2    Number of Visits 12    Date for PT Re-Evaluation 02/05/21    PT Start Time 0801    PT Stop Time 0843    PT Time Calculation (min) 42 min    Activity Tolerance Patient tolerated treatment well    Behavior During Therapy [facility] for tasks assessed/performed           Past Medical History:  Diagnosis Date  . Arthritis   . Cervical polyp   . Degenerative arthritis of knee, bilateral 12/09/2015   Injections 12/09/2015 Started Orthovisc left knee 02/03/2016   . Fibroid   . H/O left knee surgery 09/2017  . Knee pain, left 12/29/2010  . Menometrorrhagia   . Vitamin D deficiency     Past Surgical History:  Procedure Laterality Date  . COMBINED HYSTEROSCOPY DIAGNOSTIC / D&C    . ENDOMETRIAL BIOPSY  12/17/2008  . HYSTEROSCOPY WITH D & C  08/31/2012   Procedure: DILATATION AND CURETTAGE /HYSTEROSCOPY;  Surgeon: Delice Lesch, MD;  Location: [facility];  Service: Gynecology;  Laterality: N/A;  with resectoscope  . KNEE SURGERY    . TUBAL LIGATION  1995    There were no vitals filed for this visit.   Subjective Assessment - 01/08/21 0842    Subjective Grace Lyons has not done any of her HEP.  She did not want to continue cupping today.    Pertinent History PMH:OA, Lt knee surgery 5-6 years ago    Limitations Sitting    How long can you sit comfortably? With support good, without < 10 minutes    How long can you stand comfortably? 10-15 minutes    How long can you walk comfortably? More limited by ankle    Diagnostic tests no recent imaging in chart, she states she had recent MRI on Lt ankle so she was asked to bring a copy  next time    Patient Stated Goals get the pain down    Currently in Pain? Yes    Pain Score 8     Pain Location Thoracic    Pain Orientation Lower    Pain Descriptors / Indicators Sharp    Pain Type Acute pain    Pain Radiating Towards NA    Pain Onset 1 to 4 weeks ago    Pain Frequency Constant    Aggravating Factors  Has been constant recently    Pain Relieving Factors Heat    Effect of Pain on Daily Activities Limits all function    Multiple Pain Sites No                             [facility] Adult PT Treatment/Exercise - 01/08/21 0001      Therapeutic Activites    Therapeutic Activities Other Therapeutic Activities    Other Therapeutic Activities Reviewed imaging results, basic body mechanics (posture, golfer's and diagonal lift) and additions to HEP      Exercises   Exercises Lumbar;Ankle      Lumbar Exercises: Stretches   Other Lumbar Stretch Exercise Standing trunk extension AROM 3 sets of  5 for 3 seconds      Lumbar Exercises: Standing   Other Standing Lumbar Exercises Shoulder blade pinches 3 sets of 5 for 5 seconds      Modalities   Modalities Moist Heat      Moist Heat Therapy   Number Minutes Moist Heat 8 Minutes    Moist Heat Location Lumbar Spine      Ankle Exercises: Stretches   Slant Board Stretch 3 reps;30 seconds      Ankle Exercises: Standing   Heel Raises Both;10 reps;3 seconds;Limitations    Heel Raises Limitations 2 sets      Ankle Exercises: Seated   Other Seated Ankle Exercises Ankle Inversion Isometrics with ball 2 sets of 10 for 5 seconds                  PT Education - 01/08/21 1644    Education Details Reviewed available imaging reports, basic posture and body mechanics and discussed additions to her HEP.    Person(s) Educated Patient    Methods Explanation;Demonstration;Verbal cues;Handout    Comprehension Verbalized understanding;Returned demonstration;Need further instruction;Verbal cues required             PT Short Term Goals - 01/08/21 1644      PT SHORT TERM GOAL #1   Title Pt will be I and compliant with HEP    Time 3    Period Weeks    Status On-going    Target Date 01/15/21             PT Long Term Goals - 01/08/21 1645      PT LONG TERM GOAL #1   Title Pt will improve FOTO goal to 60% functional    Time 6    Period Weeks    Status On-going      PT LONG TERM GOAL #2   Title Pt will improve thoracic ROM to [facility] with less than 2/10 pain    Time 6    Period Weeks    Status On-going      PT LONG TERM GOAL #3   Title Pt will reduce thoracic pain and ankle pain to overall no more than 3/10 with ususal ADL's, and ambulation including walking her dogs.    Time 6    Period Weeks    Status On-going                 Plan - 01/08/21 1645    Clinical Impression Statement Grace Lyons reports zero HEP compliance since evaluation.  Her back has been most troublesome so we spent time discussing posture and body mechanics along with postural correction exercises.  Added a few ankle activities as well per Grace Lyons's request.  Good HEP compliance will be beneficial to meeting long-term goals.    Personal Factors and Comorbidities Comorbidity 2    Comorbidities PMH:OA, RA, Lt knee surgery.    Examination-Activity Limitations Bend;Lift;Carry;Locomotion Level;Reach Overhead    Examination-Participation Restrictions Cleaning;Laundry    Stability/Clinical Decision Making Evolving/Moderate complexity    Rehab Potential Good    PT Frequency 2x / week   1-2   PT Duration 6 weeks    PT Treatment/Interventions ADLs/Self Care Home Management;Cryotherapy;Electrical Stimulation;Iontophoresis 4mg /ml Dexamethasone;Moist Heat;Traction;Ultrasound;Therapeutic activities;Therapeutic exercise;Neuromuscular re-education;Manual techniques;Passive range of motion;Dry needling;Taping;Vasopneumatic Device;Spinal Manipulations;Joint Manipulations    PT Next Visit Plan Postural correction exercises.  Basic  ankle (dorsiflexion) stretching and strengthening along with posture and body mechanics education.    PT Home Exercise Plan Access Code: [email or web address]  Consulted and Agree with Plan of Care Patient           Patient will benefit from skilled therapeutic intervention in order to improve the following deficits and impairments:  Decreased activity tolerance,Decreased mobility,Decreased range of motion,Decreased strength,Difficulty walking,Postural dysfunction,Impaired flexibility,Increased muscle spasms,Increased fascial restricitons,Pain,Improper body mechanics  Visit Diagnosis: Difficulty in walking, not elsewhere classified  Abnormal posture  Localized edema  Muscle weakness (generalized)  Pain in left ankle and joints of left foot  Pain in thoracic spine     Problem List Patient Active Problem List   Diagnosis Date Noted  . Posterior tibial tendinitis of left lower extremity 10/19/2020  . Need for influenza vaccination 06/01/2020  . Skin rash 03/16/2020  . Candidiasis of breast 03/16/2020  . Screening breast examination 08/29/2019  . Trigger point of right shoulder region 08/13/2019  . Left lumbar radiculopathy 05/07/2019  . Greater trochanteric bursitis of left hip 05/07/2019  . Instability of right knee joint 01/08/2019  . Palpitations 11/09/2018  . Menometrorrhagia 03/13/2018  . Polyp of cervix 03/13/2018  . Refuses statin 03/13/2018  . H/O arthroscopy 10/20/2017  . Instability of left knee joint 07/05/2017  . Other bilateral secondary osteoarthritis of knee 11/26/2014  . Pes planus of both feet 06/20/2014  . Loss of transverse plantar arch 06/20/2014  . Prediabetes 12/23/2009  . Vitamin D deficiency 12/23/2009    Farley Ly PT, MPT 01/08/2021, 4:48 PM  [facility] [address] [place], [place], [address] Phone: [phone]   Fax:  [phone]  Name: Grace Lyons MRN: [id number] Date of Birth: 04-29-1961

## 2021-01-12 ENCOUNTER — Telehealth: Payer: Self-pay | Admitting: Family Medicine

## 2021-01-12 DIAGNOSIS — S39012A Strain of muscle, fascia and tendon of lower back, initial encounter: Secondary | ICD-10-CM

## 2021-01-12 MED ORDER — IBUPROFEN 600 MG PO TABS: 600.0000 mg | ORAL_TABLET | Freq: Three times a day (TID) | ORAL | 0 refills | Status: DC | PRN

## 2021-01-12 NOTE — Telephone Encounter (Signed)
Patient would like to add lower back to her existing PT on [place]. Patient also need a refill of 600 mg Ibuprofen to [facility] on [address].

## 2021-01-12 NOTE — Telephone Encounter (Signed)
New PT order placed.  Ibuprofen refilled

## 2021-01-12 NOTE — Telephone Encounter (Signed)
Spoke w/ pt to let her know [name] revised the PT order and refilled her IBU. Pt verbalized understanding.

## 2021-01-15 ENCOUNTER — Other Ambulatory Visit: Payer: Self-pay

## 2021-01-15 ENCOUNTER — Ambulatory Visit (INDEPENDENT_AMBULATORY_CARE_PROVIDER_SITE_OTHER): Payer: 59 | Admitting: Rehabilitative and Restorative Service Providers"

## 2021-01-15 ENCOUNTER — Encounter: Payer: Self-pay | Admitting: Rehabilitative and Restorative Service Providers"

## 2021-01-15 DIAGNOSIS — R293 Abnormal posture: Secondary | ICD-10-CM

## 2021-01-15 DIAGNOSIS — R600 Localized edema: Secondary | ICD-10-CM

## 2021-01-15 DIAGNOSIS — M25572 Pain in left ankle and joints of left foot: Secondary | ICD-10-CM

## 2021-01-15 DIAGNOSIS — M6281 Muscle weakness (generalized): Secondary | ICD-10-CM | POA: Diagnosis not present

## 2021-01-15 DIAGNOSIS — R262 Difficulty in walking, not elsewhere classified: Secondary | ICD-10-CM | POA: Diagnosis not present

## 2021-01-15 DIAGNOSIS — M546 Pain in thoracic spine: Secondary | ICD-10-CM

## 2021-01-15 NOTE — Patient Instructions (Signed)
Added heel to toe raises with pelvic stabilization and hip hike to HEP

## 2021-01-15 NOTE — Therapy (Signed)
[facility] Physical Therapy [address] [place], [place], [address] Phone: [phone]   Fax:  [phone]  Physical Therapy Treatment  Patient Details  Name: Grace Lyons MRN: [id number] Date of Birth: 1961/06/24 Referring Provider (PT): Gregor Hams, MD   Encounter Date: 01/15/2021   PT End of Session - 01/15/21 1022    Visit Number 3    Number of Visits 12    Date for PT Re-Evaluation 02/05/21    PT Start Time 0845    PT Stop Time 0930    PT Time Calculation (min) 45 min    Activity Tolerance Patient tolerated treatment well;No increased pain    Behavior During Therapy WFL for tasks assessed/performed           Past Medical History:  Diagnosis Date  . Arthritis   . Cervical polyp   . Degenerative arthritis of knee, bilateral 12/09/2015   Injections 12/09/2015 Started Orthovisc left knee 02/03/2016   . Fibroid   . H/O left knee surgery 09/2017  . Knee pain, left 12/29/2010  . Menometrorrhagia   . Vitamin D deficiency     Past Surgical History:  Procedure Laterality Date  . COMBINED HYSTEROSCOPY DIAGNOSTIC / D&C    . ENDOMETRIAL BIOPSY  12/17/2008  . HYSTEROSCOPY WITH D & C  08/31/2012   Procedure: DILATATION AND CURETTAGE /HYSTEROSCOPY;  Surgeon: Delice Lesch, MD;  Location: [facility];  Service: Gynecology;  Laterality: N/A;  with resectoscope  . KNEE SURGERY    . TUBAL LIGATION  1995    There were no vitals filed for this visit.   Subjective Assessment - 01/15/21 0857    Subjective [place] reports good HEP compliance.  No pain meds over the past 3 days.    Pertinent History PMH:OA, Lt knee surgery 5-6 years ago    Limitations Sitting    How long can you sit comfortably? With support good, without < 10 minutes    How long can you stand comfortably? 10-15 minutes    How long can you walk comfortably? More limited by ankle    Diagnostic tests no recent imaging in chart, she states she had recent MRI on Lt ankle so she was asked to bring a  copy next time    Patient Stated Goals get the pain down    Currently in Pain? Yes    Pain Score 3     Pain Location Thoracic    Pain Orientation Lower    Pain Descriptors / Indicators Sharp    Pain Type Acute pain    Pain Radiating Towards NA    Pain Onset 1 to 4 weeks ago    Pain Frequency Constant    Aggravating Factors  Prolonged postures and slouched postures    Pain Relieving Factors Heat    Effect of Pain on Daily Activities Stiff with change of position in the AM and with prolonged postures    Multiple Pain Sites No                             [facility] Adult PT Treatment/Exercise - 01/15/21 0001      Posture/Postural Control   Posture/Postural Control Postural limitations    Postural Limitations Forward head;Rounded Shoulders;Decreased lumbar lordosis      Therapeutic Activites    Therapeutic Activities ADL's;Lifting    ADL's Log roll    Lifting Golfers and diagonal lifts      Exercises  Exercises Lumbar;Ankle      Lumbar Exercises: Stretches   Other Lumbar Stretch Exercise Standing trunk extension AROM 3 sets of 5 for 3 seconds      Lumbar Exercises: Standing   Row Strengthening;Both;20 reps;Theraband    Theraband Level (Row) Level 4 (Blue)    Row Limitations 3 seconds good posture    Other Standing Lumbar Exercises Shoulder blade pinches 3 sets of 5 for 5 seconds    Other Standing Lumbar Exercises Hip hike 10X 3 seconds      Lumbar Exercises: Prone   Straight Leg Raise 10 reps;3 seconds    Straight Leg Raises Limitations Prone alternating hip extensions ankle in dorsiflexion      Ankle Exercises: Stretches   Slant Board Stretch 3 reps;30 seconds      Ankle Exercises: Standing   Heel Raises Both;10 reps;3 seconds;Limitations    Heel Raises Limitations 2 sets    Toe Raise 10 reps;3 seconds;Limitations    Toe Raise Limitations 2 sets with heel raises and trunk stabilization      Ankle Exercises: Seated   Other Seated Ankle Exercises Ankle  Inversion Isometrics with ball 2 sets of 10 for 5 seconds                  PT Education - 01/15/21 1021    Education Details Added spine stability activities to Layali's HEP.  Spent time reviewing practical body mechanics.    Person(s) Educated Patient    Methods Explanation;Demonstration;Tactile cues;Verbal cues;Handout    Comprehension Verbalized understanding;Tactile cues required;Returned demonstration;Need further instruction;Verbal cues required            PT Short Term Goals - 01/15/21 1022      PT SHORT TERM GOAL #1   Title Pt will be I and compliant with HEP    Time 3    Period Weeks    Status On-going    Target Date 01/15/21             PT Long Term Goals - 01/15/21 1022      PT LONG TERM GOAL #1   Title Pt will improve FOTO goal to 60% functional    Time 6    Period Weeks    Status On-going      PT LONG TERM GOAL #2   Title Pt will improve thoracic ROM to [facility] with less than 2/10 pain    Time 6    Period Weeks    Status On-going      PT LONG TERM GOAL #3   Title Pt will reduce thoracic pain and ankle pain to overall no more than 3/10 with ususal ADL's, and ambulation including walking her dogs.    Time 6    Period Weeks    Status On-going                 Plan - 01/15/21 1024    Clinical Impression Statement Sigrid has been much better with her HEP compliance since her last visit.  She reports less pain and better postural awareness.  Body mechanics still need work as does core and ankle strength.    Personal Factors and Comorbidities Comorbidity 2    Comorbidities PMH:OA, RA, Lt knee surgery.    Examination-Activity Limitations Bend;Lift;Carry;Locomotion Level;Reach Overhead    Examination-Participation Restrictions Cleaning;Laundry    Stability/Clinical Decision Making Evolving/Moderate complexity    Rehab Potential Good    PT Frequency 2x / week   1-2   PT Duration 6  weeks    PT Treatment/Interventions ADLs/Self Care Home  Management;Cryotherapy;Electrical Stimulation;Iontophoresis 4mg /ml Dexamethasone;Moist Heat;Traction;Ultrasound;Therapeutic activities;Therapeutic exercise;Neuromuscular re-education;Manual techniques;Passive range of motion;Dry needling;Taping;Vasopneumatic Device;Spinal Manipulations;Joint Manipulations    PT Next Visit Plan Practical posture and body mechanics education.  Spine and ankle strengthening.    PT Home Exercise Plan Access Code: [email or web address]    Consulted and Agree with Plan of Care Patient           Patient will benefit from skilled therapeutic intervention in order to improve the following deficits and impairments:  Decreased activity tolerance,Decreased mobility,Decreased range of motion,Decreased strength,Difficulty walking,Postural dysfunction,Impaired flexibility,Increased muscle spasms,Increased fascial restricitons,Pain,Improper body mechanics  Visit Diagnosis: Difficulty in walking, not elsewhere classified  Abnormal posture  Localized edema  Muscle weakness (generalized)  Pain in left ankle and joints of left foot  Pain in thoracic spine     Problem List Patient Active Problem List   Diagnosis Date Noted  . Posterior tibial tendinitis of left lower extremity 10/19/2020  . Need for influenza vaccination 06/01/2020  . Skin rash 03/16/2020  . Candidiasis of breast 03/16/2020  . Screening breast examination 08/29/2019  . Trigger point of right shoulder region 08/13/2019  . Left lumbar radiculopathy 05/07/2019  . Greater trochanteric bursitis of left hip 05/07/2019  . Instability of right knee joint 01/08/2019  . Palpitations 11/09/2018  . Menometrorrhagia 03/13/2018  . Polyp of cervix 03/13/2018  . Refuses statin 03/13/2018  . H/O arthroscopy 10/20/2017  . Instability of left knee joint 07/05/2017  . Other bilateral secondary osteoarthritis of knee 11/26/2014  . Pes planus of both feet 06/20/2014  . Loss of transverse plantar arch 06/20/2014  .  Prediabetes 12/23/2009  . Vitamin D deficiency 12/23/2009    Farley Ly PT, MPT 01/15/2021, 10:27 AM  [facility] Physical Therapy [address] [place], [place], [address] Phone: [phone]   Fax:  [phone]  Name: Grace Lyons MRN: [id number] Date of Birth: May 18, 1961

## 2021-01-19 ENCOUNTER — Ambulatory Visit (INDEPENDENT_AMBULATORY_CARE_PROVIDER_SITE_OTHER): Payer: 59 | Admitting: Rheumatology

## 2021-01-19 ENCOUNTER — Other Ambulatory Visit: Payer: Self-pay

## 2021-01-19 ENCOUNTER — Encounter: Payer: Self-pay | Admitting: Rheumatology

## 2021-01-19 VITALS — BP 125/84 | HR 70 | Resp 14 | Ht 61.0 in | Wt 177.2 lb

## 2021-01-19 DIAGNOSIS — M174 Other bilateral secondary osteoarthritis of knee: Secondary | ICD-10-CM

## 2021-01-19 DIAGNOSIS — E559 Vitamin D deficiency, unspecified: Secondary | ICD-10-CM

## 2021-01-19 DIAGNOSIS — M2142 Flat foot [pes planus] (acquired), left foot: Secondary | ICD-10-CM

## 2021-01-19 DIAGNOSIS — M79642 Pain in left hand: Secondary | ICD-10-CM

## 2021-01-19 DIAGNOSIS — Z9889 Other specified postprocedural states: Secondary | ICD-10-CM

## 2021-01-19 DIAGNOSIS — M5136 Other intervertebral disc degeneration, lumbar region: Secondary | ICD-10-CM

## 2021-01-19 DIAGNOSIS — M79641 Pain in right hand: Secondary | ICD-10-CM

## 2021-01-19 DIAGNOSIS — M170 Bilateral primary osteoarthritis of knee: Secondary | ICD-10-CM | POA: Diagnosis not present

## 2021-01-19 DIAGNOSIS — N921 Excessive and frequent menstruation with irregular cycle: Secondary | ICD-10-CM

## 2021-01-19 DIAGNOSIS — R768 Other specified abnormal immunological findings in serum: Secondary | ICD-10-CM | POA: Diagnosis not present

## 2021-01-19 DIAGNOSIS — R002 Palpitations: Secondary | ICD-10-CM

## 2021-01-19 DIAGNOSIS — R7303 Prediabetes: Secondary | ICD-10-CM

## 2021-01-19 DIAGNOSIS — M5416 Radiculopathy, lumbar region: Secondary | ICD-10-CM

## 2021-01-19 DIAGNOSIS — M76822 Posterior tibial tendinitis, left leg: Secondary | ICD-10-CM

## 2021-01-19 DIAGNOSIS — M2141 Flat foot [pes planus] (acquired), right foot: Secondary | ICD-10-CM

## 2021-01-19 DIAGNOSIS — M7062 Trochanteric bursitis, left hip: Secondary | ICD-10-CM | POA: Diagnosis not present

## 2021-01-19 NOTE — Patient Instructions (Signed)
Journal for Nurse Practitioners, 15(4), 263-267. Retrieved May 21, 2018 from http://clinicalkey.com/nursing">  Knee Exercises Ask your health care provider which exercises are safe for you. Do exercises exactly as told by your health care provider and adjust them as directed. It is normal to feel mild stretching, pulling, tightness, or discomfort as you do these exercises. Stop right away if you feel sudden pain or your pain gets worse. Do not begin these exercises until told by your health care provider. Stretching and range-of-motion exercises These exercises warm up your muscles and joints and improve the movement and flexibility of your knee. These exercises also help to relieve pain and swelling. Knee extension, prone 1. Lie on your abdomen (prone position) on a bed. 2. Place your left / right knee just beyond the edge of the surface so your knee is not on the bed. You can put a towel under your left / right thigh just above your kneecap for comfort. 3. Relax your leg muscles and allow gravity to straighten your knee (extension). You should feel a stretch behind your left / right knee. 4. Hold this position for __________ seconds. 5. Scoot up so your knee is supported between repetitions. Repeat __________ times. Complete this exercise __________ times a day. Knee flexion, active 1. Lie on your back with both legs straight. If this causes back discomfort, bend your left / right knee so your foot is flat on the floor. 2. Slowly slide your left / right heel back toward your buttocks. Stop when you feel a gentle stretch in the front of your knee or thigh (flexion). 3. Hold this position for __________ seconds. 4. Slowly slide your left / right heel back to the starting position. Repeat __________ times. Complete this exercise __________ times a day.   Quadriceps stretch, prone 1. Lie on your abdomen on a firm surface, such as a bed or padded floor. 2. Bend your left / right knee and hold  your ankle. If you cannot reach your ankle or pant leg, loop a belt around your foot and grab the belt instead. 3. Gently pull your heel toward your buttocks. Your knee should not slide out to the side. You should feel a stretch in the front of your thigh and knee (quadriceps). 4. Hold this position for __________ seconds. Repeat __________ times. Complete this exercise __________ times a day.   Hamstring, supine 1. Lie on your back (supine position). 2. Loop a belt or towel over the ball of your left / right foot. The ball of your foot is on the walking surface, right under your toes. 3. Straighten your left / right knee and slowly pull on the belt to raise your leg until you feel a gentle stretch behind your knee (hamstring). ? Do not let your knee bend while you do this. ? Keep your other leg flat on the floor. 4. Hold this position for __________ seconds. Repeat __________ times. Complete this exercise __________ times a day. Strengthening exercises These exercises build strength and endurance in your knee. Endurance is the ability to use your muscles for a long time, even after they get tired. Quadriceps, isometric This exercise stretches the muscles in front of your thigh (quadriceps) without moving your knee joint (isometric). 1. Lie on your back with your left / right leg extended and your other knee bent. Put a rolled towel or small pillow under your knee if told by your health care provider. 2. Slowly tense the muscles in the front of your   left / right thigh. You should see your kneecap slide up toward your hip or see increased dimpling just above the knee. This motion will push the back of the knee toward the floor. 3. For __________ seconds, hold the muscle as tight as you can without increasing your pain. 4. Relax the muscles slowly and completely. Repeat __________ times. Complete this exercise __________ times a day.   Straight leg raises This exercise stretches the muscles in  front of your thigh (quadriceps) and the muscles that move your hips (hip flexors). 1. Lie on your back with your left / right leg extended and your other knee bent. 2. Tense the muscles in the front of your left / right thigh. You should see your kneecap slide up or see increased dimpling just above the knee. Your thigh may even shake a bit. 3. Keep these muscles tight as you raise your leg 4-6 inches (10-15 cm) off the floor. Do not let your knee bend. 4. Hold this position for __________ seconds. 5. Keep these muscles tense as you lower your leg. 6. Relax your muscles slowly and completely after each repetition. Repeat __________ times. Complete this exercise __________ times a day. Hamstring, isometric 1. Lie on your back on a firm surface. 2. Bend your left / right knee about __________ degrees. 3. Dig your left / right heel into the surface as if you are trying to pull it toward your buttocks. Tighten the muscles in the back of your thighs (hamstring) to "dig" as hard as you can without increasing any pain. 4. Hold this position for __________ seconds. 5. Release the tension gradually and allow your muscles to relax completely for __________ seconds after each repetition. Repeat __________ times. Complete this exercise __________ times a day. Hamstring curls If told by your health care provider, do this exercise while wearing ankle weights. Begin with __________ lb weights. Then increase the weight by 1 lb (0.5 kg) increments. Do not wear ankle weights that are more than __________ lb. 1. Lie on your abdomen with your legs straight. 2. Bend your left / right knee as far as you can without feeling pain. Keep your hips flat against the floor. 3. Hold this position for __________ seconds. 4. Slowly lower your leg to the starting position. Repeat __________ times. Complete this exercise __________ times a day.   Squats This exercise strengthens the muscles in front of your thigh and knee  (quadriceps). 1. Stand in front of a table, with your feet and knees pointing straight ahead. You may rest your hands on the table for balance but not for support. 2. Slowly bend your knees and lower your hips like you are going to sit in a chair. ? Keep your weight over your heels, not over your toes. ? Keep your lower legs upright so they are parallel with the table legs. ? Do not let your hips go lower than your knees. ? Do not bend lower than told by your health care provider. ? If your knee pain increases, do not bend as low. 3. Hold the squat position for __________ seconds. 4. Slowly push with your legs to return to standing. Do not use your hands to pull yourself to standing. Repeat __________ times. Complete this exercise __________ times a day. Wall slides This exercise strengthens the muscles in front of your thigh and knee (quadriceps). 1. Lean your back against a smooth wall or door, and walk your feet out 18-24 inches (46-61 cm) from it. 2.   Place your feet hip-width apart. 3. Slowly slide down the wall or door until your knees bend __________ degrees. Keep your knees over your heels, not over your toes. Keep your knees in line with your hips. 4. Hold this position for __________ seconds. Repeat __________ times. Complete this exercise __________ times a day.   Straight leg raises This exercise strengthens the muscles that rotate the leg at the hip and move it away from your body (hip abductors). 1. Lie on your side with your left / right leg in the top position. Lie so your head, shoulder, knee, and hip line up. You may bend your bottom knee to help you keep your balance. 2. Roll your hips slightly forward so your hips are stacked directly over each other and your left / right knee is facing forward. 3. Leading with your heel, lift your top leg 4-6 inches (10-15 cm). You should feel the muscles in your outer hip lifting. ? Do not let your foot drift forward. ? Do not let your  knee roll toward the ceiling. 4. Hold this position for __________ seconds. 5. Slowly return your leg to the starting position. 6. Let your muscles relax completely after each repetition. Repeat __________ times. Complete this exercise __________ times a day.   Straight leg raises This exercise stretches the muscles that move your hips away from the front of the pelvis (hip extensors). 1. Lie on your abdomen on a firm surface. You can put a pillow under your hips if that is more comfortable. 2. Tense the muscles in your buttocks and lift your left / right leg about 4-6 inches (10-15 cm). Keep your knee straight as you lift your leg. 3. Hold this position for __________ seconds. 4. Slowly lower your leg to the starting position. 5. Let your leg relax completely after each repetition. Repeat __________ times. Complete this exercise __________ times a day. This information is not intended to replace advice given to you by your health care provider. Make sure you discuss any questions you have with your health care provider. Document Revised: 05/22/2018 Document Reviewed: 05/22/2018 Elsevier Patient Education  2021 [employer] Hand Exercises Hand exercises can be helpful for almost anyone. These exercises can strengthen the hands, improve flexibility and movement, and increase blood flow to the hands. These results can make work and daily tasks easier. Hand exercises can be especially helpful for people who have joint pain from arthritis or have nerve damage from overuse (carpal tunnel syndrome). These exercises can also help people who have injured a hand. Exercises Most of these hand exercises are gentle stretching and motion exercises. It is usually safe to do them often throughout the day. Warming up your hands before exercise may help to reduce stiffness. You can do this with gentle massage or by placing your hands in warm water for 10-15 minutes. It is normal to feel some stretching,  pulling, tightness, or mild discomfort as you begin new exercises. This will gradually improve. Stop an exercise right away if you feel sudden, severe pain or your pain gets worse. Ask your health care provider which exercises are best for you. Knuckle bend or "claw" fist 1. Stand or sit with your arm, hand, and all five fingers pointed straight up. Make sure to keep your wrist straight during the exercise. 2. Gently bend your fingers down toward your palm until the tips of your fingers are touching the top of your palm. Keep your big knuckle straight and just bend the   small knuckles in your fingers. 3. Hold this position for __________ seconds. 4. Straighten (extend) your fingers back to the starting position. Repeat this exercise 5-10 times with each hand. Full finger fist 1. Stand or sit with your arm, hand, and all five fingers pointed straight up. Make sure to keep your wrist straight during the exercise. 2. Gently bend your fingers into your palm until the tips of your fingers are touching the middle of your palm. 3. Hold this position for __________ seconds. 4. Extend your fingers back to the starting position, stretching every joint fully. Repeat this exercise 5-10 times with each hand. Straight fist 1. Stand or sit with your arm, hand, and all five fingers pointed straight up. Make sure to keep your wrist straight during the exercise. 2. Gently bend your fingers at the big knuckle, where your fingers meet your hand, and the middle knuckle. Keep the knuckle at the tips of your fingers straight and try to touch the bottom of your palm. 3. Hold this position for __________ seconds. 4. Extend your fingers back to the starting position, stretching every joint fully. Repeat this exercise 5-10 times with each hand. Tabletop 1. Stand or sit with your arm, hand, and all five fingers pointed straight up. Make sure to keep your wrist straight during the exercise. 2. Gently bend your fingers at the  big knuckle, where your fingers meet your hand, as far down as you can while keeping the small knuckles in your fingers straight. Think of forming a tabletop with your fingers. 3. Hold this position for __________ seconds. 4. Extend your fingers back to the starting position, stretching every joint fully. Repeat this exercise 5-10 times with each hand. Finger spread 1. Place your hand flat on a table with your palm facing down. Make sure your wrist stays straight as you do this exercise. 2. Spread your fingers and thumb apart from each other as far as you can until you feel a gentle stretch. Hold this position for __________ seconds. 3. Bring your fingers and thumb tight together again. Hold this position for __________ seconds. Repeat this exercise 5-10 times with each hand. Making circles 1. Stand or sit with your arm, hand, and all five fingers pointed straight up. Make sure to keep your wrist straight during the exercise. 2. Make a circle by touching the tip of your thumb to the tip of your index finger. 3. Hold for __________ seconds. Then open your hand wide. 4. Repeat this motion with your thumb and each finger on your hand. Repeat this exercise 5-10 times with each hand. Thumb motion 1. Sit with your forearm resting on a table and your wrist straight. Your thumb should be facing up toward the ceiling. Keep your fingers relaxed as you move your thumb. 2. Lift your thumb up as high as you can toward the ceiling. Hold for __________ seconds. 3. Bend your thumb across your palm as far as you can, reaching the tip of your thumb for the small finger (pinkie) side of your palm. Hold for __________ seconds. Repeat this exercise 5-10 times with each hand. Grip strengthening 1. Hold a stress ball or other soft ball in the middle of your hand. 2. Slowly increase the pressure, squeezing the ball as much as you can without causing pain. Think of bringing the tips of your fingers into the middle of  your palm. All of your finger joints should bend when doing this exercise. 3. Hold your squeeze for __________ seconds,   then relax. Repeat this exercise 5-10 times with each hand.   Contact a health care provider if:  Your hand pain or discomfort gets much worse when you do an exercise.  Your hand pain or discomfort does not improve within 2 hours after you exercise. If you have any of these problems, stop doing these exercises right away. Do not do them again unless your health care provider says that you can. Get help right away if:  You develop sudden, severe hand pain or swelling. If this happens, stop doing these exercises right away. Do not do them again unless your health care provider says that you can. This information is not intended to replace advice given to you by your health care provider. Make sure you discuss any questions you have with your health care provider. Document Revised: 11/22/2018 Document Reviewed: 08/02/2018 Elsevier Patient Education  2021 [employer]  

## 2021-01-22 ENCOUNTER — Ambulatory Visit (INDEPENDENT_AMBULATORY_CARE_PROVIDER_SITE_OTHER): Payer: 59 | Admitting: Rehabilitative and Restorative Service Providers"

## 2021-01-22 ENCOUNTER — Other Ambulatory Visit: Payer: Self-pay

## 2021-01-22 ENCOUNTER — Encounter: Payer: Self-pay | Admitting: Rehabilitative and Restorative Service Providers"

## 2021-01-22 DIAGNOSIS — R262 Difficulty in walking, not elsewhere classified: Secondary | ICD-10-CM | POA: Diagnosis not present

## 2021-01-22 DIAGNOSIS — M545 Low back pain, unspecified: Secondary | ICD-10-CM

## 2021-01-22 DIAGNOSIS — M25572 Pain in left ankle and joints of left foot: Secondary | ICD-10-CM

## 2021-01-22 DIAGNOSIS — M6281 Muscle weakness (generalized): Secondary | ICD-10-CM

## 2021-01-22 DIAGNOSIS — R600 Localized edema: Secondary | ICD-10-CM | POA: Diagnosis not present

## 2021-01-22 DIAGNOSIS — R293 Abnormal posture: Secondary | ICD-10-CM

## 2021-01-22 NOTE — Therapy (Addendum)
[facility] [address] [place], [place], [address] Phone: [phone]   Fax:  [phone]  Physical Therapy Treatment  Patient Details  Name: Grace Lyons MRN: [id number] Date of Birth: 06/22/1961 Referring Provider (PT): Corey, Evan S, MD  PHYSICAL THERAPY DISCHARGE SUMMARY  Visits from Start of Care: 4  Current functional level related to goals / functional outcomes: See note   Remaining deficits: See note   Education / Equipment: HEP   Patient agrees to discharge. Patient goals were partially met. Patient is being discharged due to not returning since the last visit.  Encounter Date: 01/22/2021   PT End of Session - 01/22/21 1725     Visit Number 4    Number of Visits 12    Date for PT Re-Evaluation 02/05/21    PT Start Time 0846    PT Stop Time 0930    PT Time Calculation (min) 44 min    Activity Tolerance Patient tolerated treatment well;No increased pain    Behavior During Therapy WFL for tasks assessed/performed             Past Medical History:  Diagnosis Date   Arthritis    Cervical polyp    Degenerative arthritis of knee, bilateral 12/09/2015   Injections 12/09/2015 Started Orthovisc left knee 02/03/2016    Fibroid    H/O left knee surgery 09/2017   Knee pain, left 12/29/2010   Menometrorrhagia    Vitamin D deficiency     Past Surgical History:  Procedure Laterality Date   ENDOMETRIAL BIOPSY  12/17/2008   HYSTEROSCOPY WITH D & C  08/31/2012   Procedure: DILATATION AND CURETTAGE /HYSTEROSCOPY;  Surgeon: Angela Y Roberts, MD;  Location: [facility];  Service: Gynecology;  Laterality: N/A;  with resectoscope   KNEE SURGERY     TUBAL LIGATION  1995    There were no vitals filed for this visit.   Subjective Assessment - 01/22/21 0909     Subjective Gracielynn reports good HEP compliance.  She reports she needed pain meds 3X over the past week.  Poor body mechanics were the primary cause.    Pertinent History PMH:OA,  Lt knee surgery 5-6 years ago    Limitations Sitting    How long can you sit comfortably? With support good, without < 10 minutes    How long can you stand comfortably? 10-15 minutes    How long can you walk comfortably? More limited by ankle    Diagnostic tests no recent imaging in chart, she states she had recent MRI on Lt ankle so she was asked to bring a copy next time    Patient Stated Goals get the pain down    Currently in Pain? Yes    Pain Score 3     Pain Location Back    Pain Orientation Lower    Pain Descriptors / Indicators Aching    Pain Type Acute pain    Pain Radiating Towards NA    Pain Onset More than a month ago    Pain Frequency Constant    Aggravating Factors  Poor mechanics    Pain Relieving Factors Heat    Effect of Pain on Daily Activities Stiff in the AM and with prolonged postures    Multiple Pain Sites No                               [facility] Adult PT Treatment/Exercise - 01/22/21   0001       Posture/Postural Control   Posture/Postural Control Postural limitations    Postural Limitations Forward head;Rounded Shoulders;Decreased lumbar lordosis      Therapeutic Activites    Therapeutic Activities ADL's;Lifting    ADL's Log roll    Lifting Golfers and diagonal lifts with dog responsibilities      Exercises   Exercises Lumbar;Ankle      Lumbar Exercises: Stretches   Hip Flexor Stretch Left;Right;4 reps;20 seconds;Limitations    Hip Flexor Stretch Limitations One knee bent, other leg hanging off treatment table    Other Lumbar Stretch Exercise Standing trunk extension AROM 3 sets of 5 for 3 seconds      Lumbar Exercises: Standing   Row Strengthening;Both;20 reps;Theraband    Theraband Level (Row) Level 4 (Blue)    Row Limitations 3 seconds    Other Standing Lumbar Exercises Shoulder blade pinches 3 sets of 5 for 5 seconds    Other Standing Lumbar Exercises Hip hike 10X 3 seconds      Lumbar Exercises: Prone   Straight Leg Raise  10 reps;3 seconds    Straight Leg Raises Limitations Prone alternating hip extensions ankle in dorsiflexion      Ankle Exercises: Stretches   Slant Board Stretch 3 reps;30 seconds      Ankle Exercises: Standing   Heel Raises Both;10 reps;3 seconds;Limitations    Heel Raises Limitations 2 sets    Toe Raise 10 reps;3 seconds;Limitations    Toe Raise Limitations 2 sets with heel raises and trunk stabilization      Ankle Exercises: Seated   Other Seated Ankle Exercises Ankle Inversion Isometrics with ball 2 sets of 10 for 5 seconds                    PT Education - 01/22/21 1723     Education Details Spent a lot of time on body mechanics related to caring for her dogs.  Reviewed HEP and added an optional hip flexors stretch for postural correction (forward lean at the hips).    Person(s) Educated Patient    Methods Explanation;Demonstration;Verbal cues    Comprehension Verbalized understanding;Returned demonstration;Need further instruction;Verbal cues required              PT Short Term Goals - 01/22/21 1724       PT SHORT TERM GOAL #1   Title Pt will be I and compliant with HEP    Time 3    Period Weeks    Status Achieved    Target Date 01/15/21               PT Long Term Goals - 01/22/21 1724       PT LONG TERM GOAL #1   Title Pt will improve FOTO goal to 60% functional    Time 6    Period Weeks    Status On-going      PT LONG TERM GOAL #2   Title Pt will improve thoracic ROM to WFL with less than 2/10 pain    Time 6    Period Weeks    Status On-going      PT LONG TERM GOAL #3   Title Pt will reduce thoracic pain and ankle pain to overall no more than 3/10 with ususal ADL's, and ambulation including walking her dogs.    Time 6    Period Weeks    Status On-going                     Plan - 01/22/21 1725     Clinical Impression Statement Merleen reports awareness but inconsistency with her body mechanics.  When her mechanics are  good, she doesn't need pain medication.  When she is distracted, she does.  Spent time doing dog-specific body mechanics to avoid bending and twisting and also deep squats due to her knee OA.  Continue postural and body mechanics work along with strengthening to meet LTGs.    Personal Factors and Comorbidities Comorbidity 2    Comorbidities PMH:OA, RA, Lt knee surgery.    Examination-Activity Limitations Bend;Lift;Carry;Locomotion Level;Reach Overhead    Examination-Participation Restrictions Cleaning;Laundry    Stability/Clinical Decision Making Evolving/Moderate complexity    Rehab Potential Good    PT Frequency 2x / week   1-2   PT Duration 6 weeks    PT Treatment/Interventions ADLs/Self Care Home Management;Cryotherapy;Electrical Stimulation;Iontophoresis 4mg/ml Dexamethasone;Moist Heat;Traction;Ultrasound;Therapeutic activities;Therapeutic exercise;Neuromuscular re-education;Manual techniques;Passive range of motion;Dry needling;Taping;Vasopneumatic Device;Spinal Manipulations;Joint Manipulations    PT Next Visit Plan Practical posture and body mechanics education.  Spine and ankle strengthening.    PT Home Exercise Plan Access Code: [email or web address]    Consulted and Agree with Plan of Care Patient             Patient will benefit from skilled therapeutic intervention in order to improve the following deficits and impairments:  Decreased activity tolerance, Decreased mobility, Decreased range of motion, Decreased strength, Difficulty walking, Postural dysfunction, Impaired flexibility, Increased muscle spasms, Increased fascial restricitons, Pain, Improper body mechanics  Visit Diagnosis: Difficulty in walking, not elsewhere classified  Abnormal posture  Localized edema  Muscle weakness (generalized)  Pain in left ankle and joints of left foot  Bilateral low back pain without sciatica, unspecified chronicity     Problem List Patient Active Problem List   Diagnosis Date Noted    Posterior tibial tendinitis of left lower extremity 10/19/2020   Need for influenza vaccination 06/01/2020   Skin rash 03/16/2020   Candidiasis of breast 03/16/2020   Screening breast examination 08/29/2019   Trigger point of right shoulder region 08/13/2019   Left lumbar radiculopathy 05/07/2019   Greater trochanteric bursitis of left hip 05/07/2019   Instability of right knee joint 01/08/2019   Palpitations 11/09/2018   Menometrorrhagia 03/13/2018   Polyp of cervix 03/13/2018   Refuses statin 03/13/2018   H/O arthroscopy 10/20/2017   Instability of left knee joint 07/05/2017   Other bilateral secondary osteoarthritis of knee 11/26/2014   Pes planus of both feet 06/20/2014   Loss of transverse plantar arch 06/20/2014   Prediabetes 12/23/2009   Vitamin D deficiency 12/23/2009    Rob W Lovell PT, MPT 01/22/2021, 5:29 PM  [facility] [address] [place], [place], [address] Phone: [phone]   Fax:  [phone]  Name: Karalina R Spates MRN: [id number] Date of Birth: 10/18/1960    

## 2021-01-27 ENCOUNTER — Encounter: Payer: 59 | Admitting: Physical Therapy

## 2021-01-29 ENCOUNTER — Encounter: Payer: 59 | Admitting: Rehabilitative and Restorative Service Providers"

## 2021-02-03 ENCOUNTER — Encounter: Payer: 59 | Admitting: Physical Therapy

## 2021-02-05 ENCOUNTER — Encounter: Payer: 59 | Admitting: Rehabilitative and Restorative Service Providers"

## 2021-02-09 ENCOUNTER — Ambulatory Visit
Admission: RE | Admit: 2021-02-09 | Discharge: 2021-02-09 | Disposition: A | Discharge status: Home / Self Care | Payer: 59 | Source: Ambulatory Visit | Attending: Family Medicine | Admitting: Family Medicine

## 2021-02-09 ENCOUNTER — Other Ambulatory Visit: Payer: Self-pay | Admitting: Family Medicine

## 2021-02-09 ENCOUNTER — Other Ambulatory Visit: Payer: Self-pay

## 2021-02-09 DIAGNOSIS — Z1231 Encounter for screening mammogram for malignant neoplasm of breast: Secondary | ICD-10-CM

## 2021-02-09 NOTE — Telephone Encounter (Signed)
Please advise 

## 2021-02-12 ENCOUNTER — Encounter: Payer: 59 | Admitting: Rehabilitative and Restorative Service Providers"

## 2021-02-24 ENCOUNTER — Ambulatory Visit: Payer: 59 | Admitting: Rheumatology

## 2021-02-26 ENCOUNTER — Encounter: Payer: 59 | Admitting: Rehabilitative and Restorative Service Providers"

## 2021-03-05 ENCOUNTER — Encounter: Payer: 59 | Admitting: Rehabilitative and Restorative Service Providers"

## 2021-03-12 ENCOUNTER — Encounter: Payer: 59 | Admitting: Rehabilitative and Restorative Service Providers"

## 2021-03-30 ENCOUNTER — Encounter: Payer: 59 | Admitting: Family Medicine

## 2021-04-06 ENCOUNTER — Emergency Department (HOSPITAL_COMMUNITY): Payer: 59

## 2021-04-06 ENCOUNTER — Emergency Department (HOSPITAL_COMMUNITY)
Admission: EM | Admit: 2021-04-06 | Discharge: 2021-04-07 | Disposition: A | Discharge status: Home / Self Care | Payer: 59 | Attending: Emergency Medicine | Admitting: Emergency Medicine

## 2021-04-06 ENCOUNTER — Other Ambulatory Visit: Payer: Self-pay

## 2021-04-06 ENCOUNTER — Encounter (HOSPITAL_COMMUNITY): Payer: Self-pay

## 2021-04-06 DIAGNOSIS — Y908 Blood alcohol level of 240 mg/100 ml or more: Secondary | ICD-10-CM | POA: Diagnosis not present

## 2021-04-06 DIAGNOSIS — Z87891 Personal history of nicotine dependence: Secondary | ICD-10-CM | POA: Diagnosis not present

## 2021-04-06 DIAGNOSIS — W06XXXA Fall from bed, initial encounter: Secondary | ICD-10-CM | POA: Diagnosis not present

## 2021-04-06 DIAGNOSIS — F10120 Alcohol abuse with intoxication, uncomplicated: Secondary | ICD-10-CM | POA: Insufficient documentation

## 2021-04-06 DIAGNOSIS — S0083XA Contusion of other part of head, initial encounter: Secondary | ICD-10-CM | POA: Diagnosis not present

## 2021-04-06 DIAGNOSIS — W19XXXA Unspecified fall, initial encounter: Secondary | ICD-10-CM

## 2021-04-06 DIAGNOSIS — S0990XA Unspecified injury of head, initial encounter: Secondary | ICD-10-CM | POA: Diagnosis present

## 2021-04-06 DIAGNOSIS — Z79899 Other long term (current) drug therapy: Secondary | ICD-10-CM | POA: Diagnosis not present

## 2021-04-06 DIAGNOSIS — F10920 Alcohol use, unspecified with intoxication, uncomplicated: Secondary | ICD-10-CM

## 2021-04-06 LAB — COMPREHENSIVE METABOLIC PANEL
ALT: 30 U/L (ref 0–44)
AST: 25 U/L (ref 15–41)
Albumin: 4.1 g/dL (ref 3.5–5.0)
Alkaline Phosphatase: 52 U/L (ref 38–126)
Anion gap: 10 (ref 5–15)
BUN: 10 mg/dL (ref 6–20)
CO2: 21 mmol/L — ABNORMAL LOW (ref 22–32)
Calcium: 10.1 mg/dL (ref 8.9–10.3)
Chloride: 106 mmol/L (ref 98–111)
Creatinine, Ser: 0.7 mg/dL (ref 0.44–1.00)
GFR, Estimated: >60 mL/min (ref >60)
Glucose, Bld: 145 mg/dL — ABNORMAL HIGH (ref 70–99)
Potassium: 3.5 mmol/L (ref 3.5–5.1)
Sodium: 137 mmol/L (ref 135–145)
Total Bilirubin: 0.5 mg/dL (ref 0.3–1.2)
Total Protein: 6.8 g/dL (ref 6.5–8.1)

## 2021-04-06 LAB — CBC WITH DIFFERENTIAL/PLATELET
Abs Immature Granulocytes: 0.02 10*3/uL (ref 0.00–0.07)
Basophils Absolute: 0 10*3/uL (ref 0.0–0.1)
Basophils Relative: 0 %
Eosinophils Absolute: 0 10*3/uL (ref 0.0–0.5)
Eosinophils Relative: 0 %
HCT: 42.1 % (ref 36.0–46.0)
Hemoglobin: 13.7 g/dL (ref 12.0–15.0)
Immature Granulocytes: 0 %
Lymphocytes Relative: 11 %
Lymphs Abs: 0.8 10*3/uL (ref 0.7–4.0)
MCH: 31.4 pg (ref 26.0–34.0)
MCHC: 32.5 g/dL (ref 30.0–36.0)
MCV: 96.6 fL (ref 80.0–100.0)
Monocytes Absolute: 0.3 10*3/uL (ref 0.1–1.0)
Monocytes Relative: 3 %
Neutro Abs: 6.3 10*3/uL (ref 1.7–7.7)
Neutrophils Relative %: 86 %
Platelets: 226 10*3/uL (ref 150–400)
RBC: 4.36 MIL/uL (ref 3.87–5.11)
RDW: 14.7 % (ref 11.5–15.5)
WBC: 7.4 10*3/uL (ref 4.0–10.5)
nRBC: 0 % (ref 0.0–0.2)

## 2021-04-06 LAB — LIPASE, BLOOD: Lipase: 24 U/L (ref 11–51)

## 2021-04-06 LAB — ETHANOL: Alcohol, Ethyl (B): 248 mg/dL — ABNORMAL HIGH (ref <10)

## 2021-04-06 MED ORDER — ONDANSETRON 4 MG PO TBDP
4.0000 mg | ORAL_TABLET | Freq: Once | ORAL | Status: AC
  Administered 2021-04-06: 4 mg via ORAL
  Filled 2021-04-06: qty 1

## 2021-04-06 NOTE — ED Notes (Signed)
Partner at bedside. I updated her. Requesting pt get nausea medication. Asked PA for sublingual zofran. See new orders.

## 2021-04-06 NOTE — ED Triage Notes (Signed)
Pt's significant other called because pt has drank about 700 mL of vodka today and fell from bed and hit head on wall. Pt then stood up and hit head on another wall. Pt has no c/o. Pt refusing to wear c collar.

## 2021-04-06 NOTE — ED Notes (Signed)
Pt oriented x4, but drowsy. Pt doesn't really remember falling or how much she had to drink. Pt cooperative and calm. Only c/o is a little bit of nausea. Pt said she will not wear the c collar.

## 2021-04-06 NOTE — ED Provider Notes (Signed)
[id number] [facility] EMERGENCY DEPARTMENT Provider Note   CSN: [id number] Arrival date & time: 04/06/21  2159     History Chief Complaint  Patient presents with   Fall    Grace Lyons is a 60 y.o. female presents to the emergency department via EMS with alcohol intoxication.  Per EMS, patient's significant other called because patient drank approximately 700 mL of vodka today and then fell from bed, striking her head on the wall.  Per significant other, patient was able to stand immediately and then hit her head a second time on a different wall.  Denies loss of consciousness to EMS.  Patient readily admits that she had too much liquor to drink today.  She does not remember hitting her head and has no complaints at this time.  Per EMS patient was unwilling to wear her c-collar.  She denies neck pain at this time.  She does endorse associated nausea but no vomiting.  Reports she does not drink on a regular basis.  No other aggravating or alleviating factors.  The history is provided by the patient, medical records and a significant other. No language interpreter was used.      Past Medical History:  Diagnosis Date   Arthritis    Cervical polyp    Degenerative arthritis of knee, bilateral 12/09/2015   Injections 12/09/2015 Started Orthovisc left knee 02/03/2016    Fibroid    H/O left knee surgery 09/2017   Knee pain, left 12/29/2010   Menometrorrhagia    Vitamin D deficiency     Patient Active Problem List   Diagnosis Date Noted   Posterior tibial tendinitis of left lower extremity 10/19/2020   Need for influenza vaccination 06/01/2020   Skin rash 03/16/2020   Candidiasis of breast 03/16/2020   Screening breast examination 08/29/2019   Trigger point of right shoulder region 08/13/2019   Left lumbar radiculopathy 05/07/2019   Greater trochanteric bursitis of left hip 05/07/2019   Instability of right knee joint 01/08/2019   Palpitations 11/09/2018    Menometrorrhagia 03/13/2018   Polyp of cervix 03/13/2018   Refuses statin 03/13/2018   H/O arthroscopy 10/20/2017   Instability of left knee joint 07/05/2017   Other bilateral secondary osteoarthritis of knee 11/26/2014   Pes planus of both feet 06/20/2014   Loss of transverse plantar arch 06/20/2014   Prediabetes 12/23/2009   Vitamin D deficiency 12/23/2009    Past Surgical History:  Procedure Laterality Date   ENDOMETRIAL BIOPSY  12/17/2008   HYSTEROSCOPY WITH D & C  08/31/2012   Procedure: DILATATION AND CURETTAGE /HYSTEROSCOPY;  Surgeon: Delice Lesch, MD;  Location: [facility];  Service: Gynecology;  Laterality: N/A;  with resectoscope   KNEE SURGERY     TUBAL LIGATION  1995     OB History     Gravida  1   Para      Term      Preterm      AB      Living  1      SAB      IAB      Ectopic      Multiple      Live Births              Family History  Problem Relation Age of Onset   Diabetes Mother    Hypertension Mother    Heart disease Mother    Diabetes Father    Hypertension Father    Prostate cancer Father  Osteoarthritis Brother    Mental illness Son     Social History   Tobacco Use   Smoking status: Former    Types: Cigarettes    Quit date: 01/12/1996    Years since quitting: 25.2   Smokeless tobacco: Never  Vaping Use   Vaping Use: Never used  Substance Use Topics   Alcohol use: Yes   Drug use: No    Home Medications Prior to Admission medications   Medication Sig Start Date End Date Taking? Authorizing Provider  Cholecalciferol (VITAMIN D3) 1000 UNITS CAPS Take 1,000 Units by mouth daily.    [provider]  HYDROcodone-acetaminophen (NORCO/VICODIN) 5-325 MG tablet Take 1 tablet by mouth every 6 (six) hours as needed. 08/18/20   Gregor Hams, MD  ibuprofen (ADVIL) 600 MG tablet TAKE 1 TABLET BY MOUTH EVERY 8 HOURS AS NEEDED. 02/10/21   Gregor Hams, MD  Omega-3 Fatty Acids (OMEGA 3 PO) Take 1 capsule by mouth daily.      [provider]  tiZANidine (ZANAFLEX) 4 MG tablet Take 1 tablet (4 mg total) by mouth every 8 (eight) hours as needed for muscle spasms. 12/21/20   Gregor Hams, MD  triamcinolone cream (KENALOG) 0.1 % Apply 1 application topically as needed.    [provider]    Allergies    Doxycycline and Lidocaine  Review of Systems   Review of Systems  Constitutional:  Negative for appetite change, diaphoresis, fatigue, fever and unexpected weight change.  HENT:  Negative for mouth sores.   Eyes:  Negative for visual disturbance.  Respiratory:  Negative for cough, chest tightness, shortness of breath and wheezing.   Cardiovascular:  Negative for chest pain.  Gastrointestinal:  Negative for abdominal pain, constipation, diarrhea, nausea and vomiting.  Endocrine: Negative for polydipsia, polyphagia and polyuria.  Genitourinary:  Negative for dysuria, frequency, hematuria and urgency.  Musculoskeletal:  Negative for back pain and neck stiffness.  Skin:  Negative for rash.  Allergic/Immunologic: Negative for immunocompromised state.  Neurological:  Negative for syncope, light-headedness and headaches.  Hematological:  Does not bruise/bleed easily.  Psychiatric/Behavioral:  Negative for sleep disturbance. The patient is not nervous/anxious.    Physical Exam Updated Vital Signs BP 118/70   Pulse 78   Temp 98 F (36.7 C) (Oral)   Resp 14   Ht '5\' 2"'$  (1.575 m)   Wt 54.4 kg   LMP 08/14/2012   SpO2 100%   BMI 21.95 kg/m   Physical Exam Vitals and nursing note reviewed.  Constitutional:      General: She is not in acute distress.    Appearance: She is not diaphoretic.  HENT:     Head: Normocephalic.     Comments: Small area of ecchymosis to the right zygomatic arch.  No laceration.  No other large contusions or hematomas noted. Eyes:     General: No scleral icterus.    Conjunctiva/sclera: Conjunctivae normal.  Neck:     Comments: Full range of motion without pain.  No  midline or paraspinal tenderness.  No step-off or deformity. Cardiovascular:     Rate and Rhythm: Normal rate and regular rhythm.     Pulses: Normal pulses.          Radial pulses are 2+ on the right side and 2+ on the left side.  Pulmonary:     Effort: No tachypnea, accessory muscle usage, prolonged expiration, respiratory distress or retractions.     Breath sounds: No stridor.  Comments: Equal chest rise. No increased work of breathing. Abdominal:     General: There is no distension.     Palpations: Abdomen is soft.     Tenderness: There is no abdominal tenderness. There is no guarding or rebound.  Musculoskeletal:     Cervical back: Normal range of motion.     Comments: Moves all extremities equally and without difficulty.  Skin:    General: Skin is warm and dry.     Capillary Refill: Capillary refill takes less than 2 seconds.  Neurological:     Mental Status: She is alert.     GCS: GCS eye subscore is 4. GCS verbal subscore is 5. GCS motor subscore is 6.     Comments: Speech is clear and goal oriented.  Psychiatric:        Mood and Affect: Mood normal.    ED Results / Procedures / Treatments   Labs (all labs ordered are listed, but only abnormal results are displayed) Labs Reviewed  COMPREHENSIVE METABOLIC PANEL - Abnormal; Notable for the following components:      Result Value   CO2 21 (*)    Glucose, Bld 145 (*)    All other components within normal limits  ETHANOL - Abnormal; Notable for the following components:   Alcohol, Ethyl (B) 248 (*)    All other components within normal limits  CBC WITH DIFFERENTIAL/PLATELET  LIPASE, BLOOD  URINALYSIS, ROUTINE W REFLEX MICROSCOPIC  RAPID URINE DRUG SCREEN, HOSP PERFORMED    Radiology CT HEAD WO CONTRAST (5MM)  Result Date: 04/07/2021 CLINICAL DATA:  Trauma. EXAM: CT HEAD WITHOUT CONTRAST CT CERVICAL SPINE WITHOUT CONTRAST TECHNIQUE: Multidetector CT imaging of the head and cervical spine was performed following  the standard protocol without intravenous contrast. Multiplanar CT image reconstructions of the cervical spine were also generated. COMPARISON:  None. FINDINGS: CT HEAD FINDINGS Brain: The ventricles and sulci are appropriate size for patient's age. The gray-white matter discrimination is preserved. There is no acute intracranial hemorrhage. No mass effect midline shift. No extra-axial fluid collection. Vascular: No hyperdense vessel or unexpected calcification. Skull: Normal. Negative for fracture or focal lesion. Sinuses/Orbits: No acute finding. Other: None CT CERVICAL SPINE FINDINGS Alignment: No acute subluxation. There is straightening of normal cervical lordosis which may be positional or due to muscle spasm. Skull base and vertebrae: No acute fracture. Soft tissues and spinal canal: No prevertebral fluid or swelling. No visible canal hematoma. Disc levels:  Degenerative changes primarily at C5-C6 and C6-C7. Upper chest: Negative. Other: None IMPRESSION: 1. Unremarkable noncontrast CT of the brain. 2. No acute/traumatic cervical spine pathology. Electronically Signed   By: Anner Crete M.D.   On: 04/07/2021 00:51   CT Cervical Spine Wo Contrast  Result Date: 04/07/2021 CLINICAL DATA:  Trauma. EXAM: CT HEAD WITHOUT CONTRAST CT CERVICAL SPINE WITHOUT CONTRAST TECHNIQUE: Multidetector CT imaging of the head and cervical spine was performed following the standard protocol without intravenous contrast. Multiplanar CT image reconstructions of the cervical spine were also generated. COMPARISON:  None. FINDINGS: CT HEAD FINDINGS Brain: The ventricles and sulci are appropriate size for patient's age. The gray-white matter discrimination is preserved. There is no acute intracranial hemorrhage. No mass effect midline shift. No extra-axial fluid collection. Vascular: No hyperdense vessel or unexpected calcification. Skull: Normal. Negative for fracture or focal lesion. Sinuses/Orbits: No acute finding. Other:  None CT CERVICAL SPINE FINDINGS Alignment: No acute subluxation. There is straightening of normal cervical lordosis which may be positional or due to  muscle spasm. Skull base and vertebrae: No acute fracture. Soft tissues and spinal canal: No prevertebral fluid or swelling. No visible canal hematoma. Disc levels:  Degenerative changes primarily at C5-C6 and C6-C7. Upper chest: Negative. Other: None IMPRESSION: 1. Unremarkable noncontrast CT of the brain. 2. No acute/traumatic cervical spine pathology. Electronically Signed   By: Anner Crete M.D.   On: 04/07/2021 00:51    Procedures Procedures   Medications Ordered in ED Medications  ondansetron (ZOFRAN-ODT) disintegrating tablet 4 mg (4 mg Oral Given 04/06/21 2302)    ED Course  I have reviewed the triage vital signs and the nursing notes.  Pertinent labs & imaging results that were available during my care of the patient were reviewed by me and considered in my medical decision making (see chart for details).    MDM Rules/Calculators/A&P                           Patient presents with alcohol intoxication and fall with head injury.  Imaging and labs pending.  1:24 AM Elevated alcohol level.  CT scan head and neck without acute abnormality.  Patient remains alert and oriented.  She is ambulatory with steady gait.  No vomiting.  Wishes to go home.  I think this is reasonable.  Did have a long discussion with her about drinking secondary to stress and need for potential counseling.  Patient and significant other state understanding and are in agreement with the plan.   Final Clinical Impression(s) / ED Diagnoses Final diagnoses:  Fall, initial encounter  Alcoholic intoxication without complication [facility])    Rx / DC Orders ED Discharge Orders     None        Paullette Mckain, Gwenlyn Perking 04/07/21 0145    Fredia Sorrow, MD 04/07/21 2016

## 2021-04-06 NOTE — ED Notes (Signed)
Pt's partner was able to convince her to stay for the CT scan.

## 2021-04-06 NOTE — ED Notes (Signed)
Pt requesting to leave. PA [facility] notified.

## 2021-04-06 NOTE — ED Notes (Signed)
I called CT and told them to put pt back on list because she agreed to go.

## 2021-04-07 ENCOUNTER — Emergency Department (HOSPITAL_COMMUNITY): Payer: 59

## 2021-04-07 NOTE — ED Notes (Signed)
Pt ambulatory at d/c with significant other. Steady on feet. VSS. GCS 15.

## 2021-04-07 NOTE — Discharge Instructions (Addendum)
1. Medications: usual home medications 2. Treatment: rest, drink plenty of fluids,  3. Follow Up: Please followup with your primary doctor in 1-2 days for discussion of your diagnoses and further evaluation after today's visit; if you do not have a primary care doctor use the resource guide provided to find one; Please return to the ER for new or worsening symptoms

## 2021-04-07 NOTE — ED Notes (Signed)
Pt in CT.

## 2021-04-14 ENCOUNTER — Other Ambulatory Visit: Payer: Self-pay

## 2021-04-14 ENCOUNTER — Encounter: Payer: Self-pay | Admitting: Family Medicine

## 2021-04-14 ENCOUNTER — Ambulatory Visit (INDEPENDENT_AMBULATORY_CARE_PROVIDER_SITE_OTHER): Payer: 59 | Admitting: Family Medicine

## 2021-04-14 VITALS — BP 152/85 | HR 71 | Ht 62.0 in | Wt 177.6 lb

## 2021-04-14 DIAGNOSIS — R4586 Emotional lability: Secondary | ICD-10-CM

## 2021-04-14 DIAGNOSIS — Z0000 Encounter for general adult medical examination without abnormal findings: Secondary | ICD-10-CM

## 2021-04-14 DIAGNOSIS — R7309 Other abnormal glucose: Secondary | ICD-10-CM | POA: Diagnosis not present

## 2021-04-14 DIAGNOSIS — Z1211 Encounter for screening for malignant neoplasm of colon: Secondary | ICD-10-CM | POA: Diagnosis not present

## 2021-04-14 DIAGNOSIS — Z8342 Family history of familial hypercholesterolemia: Secondary | ICD-10-CM | POA: Diagnosis not present

## 2021-04-14 DIAGNOSIS — Z114 Encounter for screening for human immunodeficiency virus [HIV]: Secondary | ICD-10-CM

## 2021-04-14 DIAGNOSIS — Z1159 Encounter for screening for other viral diseases: Secondary | ICD-10-CM

## 2021-04-14 DIAGNOSIS — Z0001 Encounter for general adult medical examination with abnormal findings: Secondary | ICD-10-CM

## 2021-04-14 NOTE — Patient Instructions (Addendum)
Thank you for coming into the office today. Allow yourself some grace during this difficult time. I recommend speaking to a therapist and going to a [place] meeting today.     GO IMMEDIATELY to: to speak with a therapist today (8a-1p)  [facility]  [address] [place], [place] [phone] Crisis [phone]    If you are thinking about harming yourself or having thoughts of suicide, or if you know someone who is, seek help right away. Call your doctor or mental health care provider. Call 911 or go to a hospital emergency room to get immediate help, or ask a friend or family member to help you do these things. Call the [place] National Suicide Prevention Lifeline's toll-free, 24-hour hotline at 1-800-273-TALK 385-780-2727) or TTY: 1-800-799-4 TTY (838)290-5159) to talk to a trained counselor. If you are in crisis, make sure you are not left alone.  If someone else is in crisis, make sure he or she is not left alone   Visit Remembers: - Stop by the pharmacy to pick up your prescriptions  - Continue to work on your healthy eating habits and incorporating exercise into your daily life.  - Your goal is to have an BP < 120/80   Regarding lab work today:  Due to recent changes in healthcare laws, you may see the results of your imaging and laboratory studies on MyChart before your provider has had a chance to review them.  I understand that in some cases there may be results that are confusing or concerning to you. Not all laboratory results come back in the same time frame and you may be waiting for multiple results in order to interpret others.  Please give [place] 72 hours in order for your provider to thoroughly review all the results before contacting the office for clarification of your results. If everything is normal, you will get a letter in the mail or a message in My Chart. Please give [place] a call if you do not hear from [place] after 2  weeks.  Please bring all of your medications with you to each visit.    If you haven't already, sign up for My Chart to have easy access to your labs results, and communication with your primary care physician.  Feel free to call with any questions or concerns at any time, at [phone].   Take care,  Dr. Rushie [facility]  Health Maintenance, Female Adopting a healthy lifestyle and getting preventive care are important in promoting health and wellness. Ask your health care provider about: The right schedule for you to have regular tests and exams. Things you can do on your own to prevent diseases and keep yourself healthy. What should I know about diet, weight, and exercise? Eat a healthy diet  Eat a diet that includes plenty of vegetables, fruits, low-fat dairy products, and lean protein. Do not eat a lot of foods that are high in solid fats, added sugars, or sodium. Maintain a healthy weight Body mass index (BMI) is used to identify weight problems. It estimates body fat based on height and weight. Your health care provider can help determine your BMI and help you achieve or maintain a healthy weight. Get regular exercise Get regular exercise. This is one of the most important things you can do for your health. Most adults should: Exercise for at least 150 minutes each week. The exercise should increase your heart rate and make you sweat (  moderate-intensity exercise). Do strengthening exercises at least twice a week. This is in addition to the moderate-intensity exercise. Spend less time sitting. Even light physical activity can be beneficial. Watch cholesterol and blood lipids Have your blood tested for lipids and cholesterol at 60 years of age, then have this test every 5 years. Have your cholesterol levels checked more often if: Your lipid or cholesterol levels are high. You are older than 60 years of age. You are at high risk for heart disease. What  should I know about cancer screening? Depending on your health history and family history, you may need to have cancer screening at various ages. This may include screening for: Breast cancer. Cervical cancer. Colorectal cancer. Skin cancer. Lung cancer. What should I know about heart disease, diabetes, and high blood pressure? Blood pressure and heart disease High blood pressure causes heart disease and increases the risk of stroke. This is more likely to develop in people who have high blood pressure readings, are of African descent, or are overweight. Have your blood pressure checked: Every 3-5 years if you are 60-70 years of age. Every year if you are 4 years old or older. Diabetes Have regular diabetes screenings. This checks your fasting blood sugar level. Have the screening done: Once every three years after age 15 if you are at a normal weight and have a low risk for diabetes. More often and at a younger age if you are overweight or have a high risk for diabetes. What should I know about preventing infection? Hepatitis B If you have a higher risk for hepatitis B, you should be screened for this virus. Talk with your health care provider to find out if you are at risk for hepatitis B infection. Hepatitis C Testing is recommended for: Everyone born from [birth year] through 1965. Anyone with known risk factors for hepatitis C. Sexually transmitted infections (STIs) Get screened for STIs, including gonorrhea and chlamydia, if: You are sexually active and are younger than 60 years of age. You are older than 60 years of age and your health care provider tells you that you are at risk for this type of infection. Your sexual activity has changed since you were last screened, and you are at increased risk for chlamydia or gonorrhea. Ask your health care provider if you are at risk. Ask your health care provider about whether you are at high risk for HIV. Your health care provider may recommend  a prescription medicine to help prevent HIV infection. If you choose to take medicine to prevent HIV, you should first get tested for HIV. You should then be tested every 3 months for as long as you are taking the medicine. Pregnancy If you are about to stop having your period (premenopausal) and you may become pregnant, seek counseling before you get pregnant. Take 400 to 800 micrograms (mcg) of folic acid every day if you become pregnant. Ask for birth control (contraception) if you want to prevent pregnancy. Osteoporosis and menopause Osteoporosis is a disease in which the bones lose minerals and strength with aging. This can result in bone fractures. If you are 73 years old or older, or if you are at risk for osteoporosis and fractures, ask your health care provider if you should: Be screened for bone loss. Take a calcium or vitamin D supplement to lower your risk of fractures. Be given hormone replacement therapy (HRT) to treat symptoms of menopause. Follow these instructions at home: Lifestyle Do not use any products that contain  nicotine or tobacco, such as cigarettes, e-cigarettes, and chewing tobacco. If you need help quitting, ask your health care provider. Do not use street drugs. Do not share needles. Ask your health care provider for help if you need support or information about quitting drugs. Alcohol use Do not drink alcohol if: Your health care provider tells you not to drink. You are pregnant, may be pregnant, or are planning to become pregnant. If you drink alcohol: Limit how much you use to 0-1 drink a day. Limit intake if you are breastfeeding. Be aware of how much alcohol is in your drink. In the [place] one drink equals one 12 oz bottle of beer (355 mL), one 5 oz glass of wine (148 mL), or one 1 oz glass of hard liquor (44 mL). General instructions Schedule regular health, dental, and eye exams. Stay current with your vaccines. Tell your health care provider if: You  often feel depressed. You have ever been abused or do not feel safe at home. Summary Adopting a healthy lifestyle and getting preventive care are important in promoting health and wellness. Follow your health care provider's instructions about healthy diet, exercising, and getting tested or screened for diseases. Follow your health care provider's instructions on monitoring your cholesterol and blood pressure. This information is not intended to replace advice given to you by your health care provider. Make sure you discuss any questions you have with your health care provider. Document Revised: 10/09/2020 Document Reviewed: 07/25/2018 Elsevier Patient Education  2022 [employer].
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
## 2021-04-15 LAB — LIPID PANEL
Chol/HDL Ratio: 2.3 ratio (ref 0.0–4.4)
Cholesterol, Total: 144 mg/dL (ref 100–199)
HDL: 64 mg/dL (ref >39)
LDL Chol Calc (NIH): 68 mg/dL (ref 0–99)
Triglycerides: 58 mg/dL (ref 0–149)
VLDL Cholesterol Cal: 12 mg/dL (ref 5–40)

## 2021-04-15 LAB — HCV AB W REFLEX TO QUANT PCR: HCV Ab: <0.1 s/co ratio (ref 0.0–0.9)

## 2021-04-15 LAB — HIV ANTIBODY (ROUTINE TESTING W REFLEX): HIV Screen 4th Generation wRfx: NONREACTIVE

## 2021-04-15 LAB — HCV INTERPRETATION

## 2021-04-16 DIAGNOSIS — Z789 Other specified health status: Secondary | ICD-10-CM | POA: Insufficient documentation

## 2021-04-16 DIAGNOSIS — R4586 Emotional lability: Secondary | ICD-10-CM | POA: Insufficient documentation

## 2021-04-16 DIAGNOSIS — Z7289 Other problems related to lifestyle: Secondary | ICD-10-CM | POA: Insufficient documentation

## 2021-04-16 NOTE — Assessment & Plan Note (Addendum)
Discussed patient not completing her PHQ-9.  Patient states " I will be okay."  When asking about suicidality patient reports she "was".  States that she got intoxicated and ended up in the hospital.  This saddens her because she has been sober for 25 years.  Had a previous suicide attempt. There are no guns in the home.  Reports increased stress in her life including losing her job, losing her car, lack of financial support and breaking her sobriety.  I fear that she is no longer able to cope with her stress.  Discussed attending an [place] meeting and states that she will today.  Advised following up with the [facility] behavioral health center.  Address provided.  Discussed therapy from 8 AM to 1 PM today.  Advised patient to walk-in today.  States that she will do so.  Says she is no longer suicidal and will not harm herself.  Enjoys playing with her dogs.  Suicide hotline number provided.  Safety plan discussed.  Precepted with Dr. Hartford Poli (clinical psychologist)  prior to patient leaving the clinic.  -Patient to go directly to [facility] behavioral health center and find an [place] today -Follow-up in 1 to 2 weeks, can be virtual

## 2021-04-20 ENCOUNTER — Telehealth: Payer: Self-pay

## 2021-04-20 DIAGNOSIS — R7303 Prediabetes: Secondary | ICD-10-CM

## 2021-04-20 NOTE — Telephone Encounter (Signed)
Patient calls nurse line regarding A1c results. Per chart review, A1C was unable to be obtained at last visit.   Please advise if patient can come in for lab visit for A1c check.   Talbot Grumbling, RN

## 2021-04-22 ENCOUNTER — Other Ambulatory Visit (INDEPENDENT_AMBULATORY_CARE_PROVIDER_SITE_OTHER): Payer: 59

## 2021-04-22 ENCOUNTER — Other Ambulatory Visit: Payer: Self-pay

## 2021-04-22 DIAGNOSIS — R7303 Prediabetes: Secondary | ICD-10-CM | POA: Diagnosis not present

## 2021-04-22 LAB — POCT GLYCOSYLATED HEMOGLOBIN (HGB A1C): HbA1c, POC (prediabetic range): 6 % (ref 5.7–6.4)

## 2021-04-22 NOTE — Telephone Encounter (Signed)
Called patient. Patient will come in for lab only visit this morning.   Talbot Grumbling, RN

## 2021-08-03 ENCOUNTER — Telehealth: Payer: Self-pay | Admitting: Family Medicine

## 2021-08-03 NOTE — Telephone Encounter (Signed)
Pt requesting Tramadol. She is having the same LBP we have seen her for but she thinks she pulled something and it is worse.  I recommended a visit, she requested to ask about medicine first.

## 2021-08-04 MED ORDER — TRAMADOL HCL 50 MG PO TABS: 50.0000 mg | ORAL_TABLET | Freq: Three times a day (TID) | ORAL | 0 refills | Status: DC | PRN

## 2021-08-04 NOTE — Telephone Encounter (Signed)
Pt notified via V/M

## 2021-08-04 NOTE — Telephone Encounter (Signed)
Tramadol sent to [facility]

## 2021-08-06 ENCOUNTER — Ambulatory Visit: Payer: 59 | Admitting: Family Medicine

## 2021-08-06 NOTE — Progress Notes (Deleted)
° °  I, Grace Lyons, LAT, ATC acting as a scribe for Grace Leader, MD.  Grace Lyons is a 60 y.o. female who presents to [facility] today for f/u thoracic back pain. Pt was last seen by [name] on 12/30/20 and was advised to cont PT, completing 4 total visits. Of note, pt was transported to the ED on 04/06/21 for a fall due to alcohol intoxication. Today, pt report  Dx imaging: 12/30/20 T-spine XR 05/07/19 L-spine XR  Pertinent review of systems: ***  Relevant historical information: ***   Exam:  LMP 08/14/2012  General: Well Developed, well nourished, and in no acute distress.   MSK: ***    Lab and Radiology Results No results found for this or any previous visit (from the past 72 hour(s)). No results found.     Assessment and Plan: 60 y.o. female with ***   PDMP not reviewed this encounter. No orders of the defined types were placed in this encounter.  No orders of the defined types were placed in this encounter.    Discussed warning signs or symptoms. Please see discharge instructions. Patient expresses understanding.   ***

## 2022-01-12 ENCOUNTER — Other Ambulatory Visit: Payer: Self-pay | Admitting: Family Medicine

## 2022-01-12 DIAGNOSIS — Z1231 Encounter for screening mammogram for malignant neoplasm of breast: Secondary | ICD-10-CM

## 2022-01-13 NOTE — Progress Notes (Signed)
    SUBJECTIVE:   CHIEF COMPLAINT / HPI: dizziness  Patient presents with a chief complaint of dizziness that has been present for 1 day She states that she first noticed the symptoms when she turned from side to side while lying in bed  Triggers: positional change while lying in bed and bending over at the waist She reports that the room felt like it was spinning and that she felt off balance.  Denies Head trauma, medication changes, HA, changes to vision (reports baseline poor vision prior to dizziness), hearing loss or facial numbness.  Patient states that she recently saw her ENT and was told that she had some cerumen (that was removed) but no other report of abnormalities.  She states that a friend gave her a pill from 2 years ago when she was diagnosed with vertigo. The patient does not know what the medication was but state that she did feel better this AM and has been avoiding the triggers that caused the feelings of dizziness yesterday.   PERTINENT  PMH / PSH:  Lumbar radiculopathy  OA of knees   OBJECTIVE:   BP 138/82   Pulse 63   Wt 166 lb 9.6 oz (75.6 kg)   LMP 08/14/2012   SpO2 100%   BMI 30.47 kg/m   Physical Exam Constitutional:      General: She is not in acute distress.    Appearance: She is not ill-appearing.  HENT:     Right Ear: Tympanic membrane, ear canal and external ear normal. There is no impacted cerumen.     Left Ear: Tympanic membrane, ear canal and external ear normal. There is no impacted cerumen.     Nose: Nose normal.     Mouth/Throat:     Pharynx: Oropharynx is clear. No posterior oropharyngeal erythema.  Eyes:     General: No scleral icterus.    Extraocular Movements: Extraocular movements intact.     Conjunctiva/sclera: Conjunctivae normal.     Pupils: Pupils are equal, round, and reactive to light.  Cardiovascular:     Rate and Rhythm: Normal rate and regular rhythm.  Pulmonary:     Effort: Pulmonary effort is normal.  Abdominal:      General: Bowel sounds are normal.  Musculoskeletal:     Cervical back: Normal range of motion and neck supple. No rigidity.  Lymphadenopathy:     Cervical: No cervical adenopathy.  Skin:    General: Skin is warm.  Neurological:     General: No focal deficit present.     Mental Status: She is alert and oriented to person, place, and time.     Cranial Nerves: Cranial nerves 2-12 are intact. No dysarthria or facial asymmetry.     Sensory: Sensation is intact.     Motor: Motor function is intact. No weakness, tremor, atrophy or abnormal muscle tone.     Gait: Gait is intact.     Comments: Normal HINTS exam   No nystagmus noted on dix-hallpike manuever    ASSESSMENT/PLAN:   Episode of dizziness Suspect this was an episode of vertigo. Exam does not appear to have red flag signs that would warrant imaging  At this time  Patient is agreeable to trial of meclizine and monitoring for repeat symptoms F/u recommended in 2 weeks      Eulis Foster, MD [place]

## 2022-01-14 ENCOUNTER — Ambulatory Visit (INDEPENDENT_AMBULATORY_CARE_PROVIDER_SITE_OTHER): Payer: Commercial Managed Care - HMO | Admitting: Family Medicine

## 2022-01-14 VITALS — BP 138/82 | HR 63 | Wt 166.6 lb

## 2022-01-14 DIAGNOSIS — R42 Dizziness and giddiness: Secondary | ICD-10-CM | POA: Diagnosis not present

## 2022-01-14 MED ORDER — MECLIZINE HCL 12.5 MG PO TABS: 12.5000 mg | ORAL_TABLET | Freq: Three times a day (TID) | ORAL | 0 refills | Status: DC | PRN

## 2022-01-14 NOTE — Patient Instructions (Signed)
I will prescribe a medication called meclizine to help with your dizziness.  This is likely related to vertigo.  Please notify our office if you have worsening symptoms despite taking this medication.  If you have worsening dizziness associated with headache or hearing loss or weakness on one side of your body I recommended she be evaluated in the ED as soon as possible.  Please follow-up with [place] in 2 weeks.

## 2022-01-16 DIAGNOSIS — R42 Dizziness and giddiness: Secondary | ICD-10-CM | POA: Insufficient documentation

## 2022-01-16 NOTE — Assessment & Plan Note (Addendum)
Suspect this was an episode of vertigo. Exam does not appear to have red flag signs that would warrant imaging  At this time  Patient is agreeable to trial of meclizine and monitoring for repeat symptoms F/u recommended in 2 weeks

## 2022-01-18 ENCOUNTER — Encounter: Payer: Self-pay | Admitting: *Deleted

## 2022-02-01 ENCOUNTER — Ambulatory Visit (INDEPENDENT_AMBULATORY_CARE_PROVIDER_SITE_OTHER): Payer: Commercial Managed Care - HMO | Admitting: Family Medicine

## 2022-02-01 ENCOUNTER — Encounter: Payer: Self-pay | Admitting: Family Medicine

## 2022-02-01 VITALS — BP 115/60 | HR 58 | Temp 98.1°F | Ht 62.0 in | Wt 171.6 lb

## 2022-02-01 DIAGNOSIS — R42 Dizziness and giddiness: Secondary | ICD-10-CM | POA: Diagnosis not present

## 2022-02-01 DIAGNOSIS — T23262A Burn of second degree of back of left hand, initial encounter: Secondary | ICD-10-CM | POA: Diagnosis not present
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
## 2022-02-01 NOTE — Patient Instructions (Signed)
    Please apply the medi honey topical above to the burn 1-2 times daily after cleaning the wound.   I expect for this to heal over the next 2-3 weeks. You can also use Vaseline.   I recommend keeping the area covered.    For your dizziness, please notify us if it becomes more frequent, you start to have worsening vision or weakness associated with the dizzy spells or headache.    You can continue to use the meclizine as needed.

## 2022-02-01 NOTE — Assessment & Plan Note (Signed)
Recommended continuing to clean wound daily.  Patient recommended to apply Medihoney to the wound once to twice daily and to keep wound covered Discussed that this will likely take a few weeks to completely heal Discussed recommendations for return to care including drainage of purulent or foul-smelling fluid or if patient develops joint pain or decreased range of motion, patient was agreeable with this plan

## 2022-02-01 NOTE — Assessment & Plan Note (Signed)
Patient continues to have normal neurological exam as well as sensory exam.  No red flag symptoms to raise concern at this time for intracranial structural abnormalities.  Likely BPPV Patient wishes to continue with meclizine as needed and carefully moving from seated to standing or supine to upright positioning Offered vestibular therapy, patient declines today Recommended patient notify office of worsening severity of dizziness or increased frequency, patient was agreeable with this plan Recommended continue meclizine as needed We discussed option of imaging if symptoms worsen or increase in frequency, patient voiced understanding

## 2022-02-05 ENCOUNTER — Ambulatory Visit (HOSPITAL_COMMUNITY)
Admission: EM | Admit: 2022-02-05 | Discharge: 2022-02-05 | Disposition: A | Discharge status: Home / Self Care | Payer: Commercial Managed Care - HMO | Attending: Internal Medicine | Admitting: Internal Medicine

## 2022-02-05 ENCOUNTER — Encounter (HOSPITAL_COMMUNITY): Payer: Self-pay | Admitting: Emergency Medicine

## 2022-02-05 DIAGNOSIS — T23222D Burn of second degree of single left finger (nail) except thumb, subsequent encounter: Secondary | ICD-10-CM | POA: Diagnosis not present

## 2022-02-05 MED ORDER — AQUAPHOR EX OINT: TOPICAL_OINTMENT | CUTANEOUS | 0 refills | Status: AC | PRN

## 2022-02-05 MED ORDER — BACITRACIN ZINC 500 UNIT/GM EX OINT
TOPICAL_OINTMENT | CUTANEOUS | Status: AC
  Filled 2022-02-05: qty 1.8

## 2022-02-05 MED ORDER — AMOXICILLIN-POT CLAVULANATE 875-125 MG PO TABS: 1.0000 | ORAL_TABLET | Freq: Two times a day (BID) | ORAL | 0 refills | Status: DC

## 2022-02-05 MED ORDER — SILVER SULFADIAZINE 1 % EX CREA: 1.0000 | TOPICAL_CREAM | Freq: Every day | CUTANEOUS | 0 refills | Status: DC

## 2022-02-14 ENCOUNTER — Ambulatory Visit
Admission: RE | Admit: 2022-02-14 | Discharge: 2022-02-14 | Disposition: A | Discharge status: Home / Self Care | Payer: Commercial Managed Care - HMO | Source: Ambulatory Visit | Attending: Family Medicine | Admitting: Family Medicine

## 2022-02-14 DIAGNOSIS — Z1231 Encounter for screening mammogram for malignant neoplasm of breast: Secondary | ICD-10-CM

## 2022-02-17 ENCOUNTER — Ambulatory Visit (HOSPITAL_BASED_OUTPATIENT_CLINIC_OR_DEPARTMENT_OTHER): Payer: 59 | Admitting: General Surgery

## 2022-03-14 ENCOUNTER — Ambulatory Visit (INDEPENDENT_AMBULATORY_CARE_PROVIDER_SITE_OTHER): Payer: Commercial Managed Care - HMO | Admitting: Student

## 2022-03-14 VITALS — BP 117/70 | HR 63 | Temp 98.6°F | Wt 169.6 lb

## 2022-03-14 DIAGNOSIS — T23262A Burn of second degree of back of left hand, initial encounter: Secondary | ICD-10-CM

## 2022-03-14 DIAGNOSIS — R7303 Prediabetes: Secondary | ICD-10-CM

## 2022-03-14 DIAGNOSIS — R21 Rash and other nonspecific skin eruption: Secondary | ICD-10-CM

## 2022-03-14 DIAGNOSIS — Z789 Other specified health status: Secondary | ICD-10-CM

## 2022-03-14 DIAGNOSIS — Z0000 Encounter for general adult medical examination without abnormal findings: Secondary | ICD-10-CM

## 2022-03-14 MED ORDER — HYDROCORTISONE 2.5 % EX OINT: TOPICAL_OINTMENT | Freq: Two times a day (BID) | CUTANEOUS | 3 refills | Status: DC

## 2022-03-14 NOTE — Progress Notes (Signed)
SUBJECTIVE:   CHIEF COMPLAINT / HPI:   Hand Burn:  Occurred in late June 2023 after she was attempting to refill the gas tank and her push lawnmower, spilled some gas on her hand.  This prompted an emergency room visit.  At that time they cleaned the wound and dressed it in the emergency room with bacitracin and nonstick Coban.  They instructed her to continue with antibiotics and Silvadene cream and to follow-up with wound care.  Today, she reports that the wound has healed quite well but does have a persistent rash on the hands.  Initial Burn:     Today:     Rash of Hands:  Rash on the hands that is chronic and recurrent in the heat She has seen dermatology previously and had been using hydrocortisone 2% with success She has been using her triamcinolone cream that she had at home for the current rash, but would like a weaker steroid cream to use Pruritic in nature No fevers chills or systemic symptoms   H/o elevated A1C: Last A1C 04/2021.    PERTINENT  PMH / PSH:   Past Medical History:  Diagnosis Date   Arthritis    Cervical polyp    Degenerative arthritis of knee, bilateral 12/09/2015   Injections 12/09/2015 Started Orthovisc left knee 02/03/2016    Fibroid    H/O left knee surgery 09/2017   Knee pain, left 12/29/2010   Menometrorrhagia    Vitamin D deficiency     OBJECTIVE:  BP 117/70   Pulse 63   Temp 98.6 F (37 C)   Wt 169 lb 9.6 oz (76.9 kg)   LMP 08/14/2012   SpO2 97%   BMI 31.02 kg/m   General: NAD, pleasant, able to participate in exam Cardiac: RRR, no murmurs auscultated Respiratory: normal WOB Extremities: warm and well perfused, no edema or cyanosis, good movement of left fingers without evidence of skin tightening or limitations in range of motion Skin:  Healing partial-thickness burn without signs of infection, drainage, open lesion Small almost vesicular lesions over dorsum aspect of the hand Psych: Normal affect and  mood  ASSESSMENT/PLAN:  Second degree burn of back of left hand Second-degree burn on back of the left hand, healing Partial-thickness, no signs of full-thickness burn, limitations in ROM, or compartment syndrome Patient did not see wound care as instructed in the ED but does not need this now Continue with bacitracin intermittently as needed, keep clean and dry.  Protect skin against activities that could cause concern for burns   Prediabetes History of prediabetes, last A1c was in 2022 Offered A1c to patient today, was declined Patient would prefer to have this performed at physical next month  Skin rash Given location of the rash on the hands with vesicular lesions, most likely diagnosis is dyshidrotic eczema versus dermatitis of some sort versus heat rash As the patient has seen benefit from weaker steroid cream with hydrocortisone, reordered hydrocortisone cream 2.5% to pharmacy Discussed risk of hypopigmentation and how to use the cream If symptoms continue, follow-up with [place].  Healthcare maintenance Patient is in need of Pap smear, eye and foot exam, urine microalbumin, and A1c as these are all out of date. Patient also would benefit from Shingrix vaccine given age, information placed on AVS today Patient reports that she will have a physical performed next month, encouraged that these be performed at the physical appt.    No orders of the defined types were placed in this encounter.  Meds  ordered this encounter  Medications   hydrocortisone 2.5 % ointment    Sig: Apply topically 2 (two) times daily. As needed for mild eczema.  Do not use for more than 1-2 weeks at a time.    Dispense:  30 g    Refill:  3   Return in about 4 weeks (around 04/11/2022) for physical. Erskine Emery, MD 03/14/2022, 9:08 AM PGY-2, [place]

## 2022-03-14 NOTE — Assessment & Plan Note (Addendum)
History of prediabetes, last A1c was in 2022 Offered A1c to patient today, was declined Patient would prefer to have this performed at physical next month

## 2022-03-14 NOTE — Patient Instructions (Addendum)
It was great to see you today! Thank you for choosing [facility] for your primary care. Grace Lyons was seen for hand burn.  Today we addressed: Continue with Bacitracin as needed for burns  I have sent hydrocortisone to the pharmacy for your rash  Please return if this continues   If you develop any increasing redness, swelling, drainage, red streaks, or fever please return for reevaluation.  Healthcare maintenance You can get the Shingrix vaccine at the pharmacy. You will need a 2nd shot 2-6 months after the first. This vaccine is important to help prevent shingles. Please request a prescription if you would like the shingrix vaccine  You are needing a pap smear, please follow up for another visit for a pap smear. Please have A1C checked at that visit as well  If you haven't already, sign up for My Chart to have easy access to your labs results, and communication with your primary care physician.  You should return to our clinic Return in about 4 weeks (around 04/11/2022) for physical.  I recommend that you always bring your medications to each appointment as this makes it easy to ensure you are on the correct medications and helps [place] not miss refills when you need them.  Please arrive 15 minutes before your appointment to ensure smooth check in process.  We appreciate your efforts in making this happen.  Please call the clinic at [phone] if your symptoms worsen or you have any concerns.  Thank you for allowing me to participate in your care, Erskine Emery, MD 03/14/2022, 8:58 AM PGY-2, [place]

## 2022-03-14 NOTE — Assessment & Plan Note (Addendum)
Documented in problem list but not addressed today and unsure of specifics of this, has physical that needs to be scheduled for next month, will address at that time.

## 2022-03-14 NOTE — Assessment & Plan Note (Signed)
Given location of the rash on the hands with vesicular lesions, most likely diagnosis is dyshidrotic eczema versus dermatitis of some sort versus heat rash As the patient has seen benefit from weaker steroid cream with hydrocortisone, reordered hydrocortisone cream 2.5% to pharmacy Discussed risk of hypopigmentation and how to use the cream If symptoms continue, follow-up with [place].

## 2022-03-14 NOTE — Assessment & Plan Note (Signed)
Patient is in need of Pap smear, eye and foot exam, urine microalbumin, and A1c as these are all out of date. Patient also would benefit from Shingrix vaccine given age, information placed on AVS today Patient reports that she will have a physical performed next month, encouraged that these be performed at the physical appt.

## 2022-03-14 NOTE — Assessment & Plan Note (Addendum)
Second-degree burn on back of the left hand, healing Partial-thickness, no signs of full-thickness burn, limitations in ROM, or compartment syndrome Patient did not see wound care as instructed in the ED but does not need this now Continue with bacitracin intermittently as needed, keep clean and dry.  Protect skin against activities that could cause concern for burns

## 2022-04-19 ENCOUNTER — Ambulatory Visit (INDEPENDENT_AMBULATORY_CARE_PROVIDER_SITE_OTHER): Payer: Commercial Managed Care - HMO | Admitting: Student

## 2022-04-19 ENCOUNTER — Ambulatory Visit (HOSPITAL_COMMUNITY)
Admission: RE | Admit: 2022-04-19 | Discharge: 2022-04-19 | Disposition: A | Discharge status: Home / Self Care | Payer: Commercial Managed Care - HMO | Source: Ambulatory Visit | Attending: Family Medicine | Admitting: Family Medicine

## 2022-04-19 ENCOUNTER — Encounter: Payer: Self-pay | Admitting: Student

## 2022-04-19 VITALS — BP 133/82 | HR 63 | Ht 62.0 in | Wt 167.4 lb

## 2022-04-19 DIAGNOSIS — R002 Palpitations: Secondary | ICD-10-CM | POA: Diagnosis not present

## 2022-04-19 DIAGNOSIS — Z0000 Encounter for general adult medical examination without abnormal findings: Secondary | ICD-10-CM

## 2022-04-19 DIAGNOSIS — R7303 Prediabetes: Secondary | ICD-10-CM

## 2022-04-19 LAB — POCT GLYCOSYLATED HEMOGLOBIN (HGB A1C): HbA1c, POC (prediabetic range): 5.9 % (ref 5.7–6.4)

## 2022-04-19 NOTE — Patient Instructions (Addendum)
It was great to see you today! Thank you for choosing [facility] for your primary care. Grace Lyons was seen for physical exam.   We will obtain the results from your OB and eye doctor  Additionally, we will call or inform you of your labs  Continue with trying to eat healthy and staying active If you have any concerns or would like to talk to someone about stress, please let me know.    If you haven't already, sign up for My Chart to have easy access to your labs results, and communication with your primary care physician.  We are checking some labs today. If they are abnormal, I will call you. If they are normal, I will send you a MyChart message (if it is active) or a letter in the mail. If you do not hear about your labs in the next 2 weeks, please call the office. I recommend that you always bring your medications to each appointment as this makes it easy to ensure you are on the correct medications and helps [place] not miss refills when you need them. Call the clinic at [phone] if your symptoms worsen or you have any concerns.  You should return to our clinic Return in about 1 year (around 04/20/2023) for Annual . Please arrive 15 minutes before your appointment to ensure smooth check in process.  We appreciate your efforts in making this happen.  Thank you for allowing me to participate in your care, Erskine Emery, MD 04/19/2022, 1:52 PM PGY-2, [place]

## 2022-04-19 NOTE — Progress Notes (Deleted)
    SUBJECTIVE:   Chief compliant/HPI: annual examination  Grace Lyons is a 61 y.o. who presents today for an annual exam.   Complains of heart palpitations that occur with anxiety, no true chest pain  No SOB Happening for a couple of days throughout the day Happened when COVID started  Financial stress  [facility] [address]  [place], [place], [place] [address]  History tabs reviewed and updated ***.   Review of systems form reviewed and notable for ***.   OBJECTIVE:   BP 133/82   Pulse 63   Ht $R'5\' 2"'jg$  (1.575 m)   Wt 167 lb 6 oz (75.9 kg)   LMP 08/14/2012   SpO2 100%   BMI 30.61 kg/m   ***  ASSESSMENT/PLAN:   No problem-specific Assessment & Plan notes found for this encounter.    Annual Examination  See AVS for age appropriate recommendations  PHQ score ***, reviewed and discussed.     04/19/2022    1:22 PM 03/14/2022    8:45 AM 01/14/2022   11:33 AM  PHQ9 SCORE ONLY  PHQ-9 Total Score 5 0 4    BP reviewed and at goal ***.  Asked about intimate partner violence and resources given as appropriate  Advance directives discussion ***  Considered the following items based upon USPSTF recommendations: Diabetes screening: ordered Screening for elevated cholesterol: 04/14/21 total 144, HDL 64, LDL 68, triglycerides 58 HIV testing:  NR  1 year ago Hepatitis C:  negative 7 years ago Hepatitis B: N/A Syphilis if at high risk:  not high risk  GC/CT not at high risk and not ordered. Osteoporosis screening considered based upon risk of fracture from [facility] calculator. Major osteoporotic fracture risk is ***%. DEXA {ordered not order:23822}.  Reviewed risk factors for latent tuberculosis and {not indicated/requested/declined:14582}   Discussed family history, BRCA testing not indicated. Tool used to risk stratify was ***.  Cervical cancer screening: due for Pap today, cytology alone ordered (HPV if ASCUS) Breast cancer screening:  UTD Colorectal cancer  screening: up to date on screening for CRC. Lung cancer screening: {discussed/declined/written MDYJ:09295}. See documentation below regarding indications/risks/benefits. Smoked 20 years ago  Vaccinations ***.   Follow up in 1 *** year or sooner if indicated.    Erskine Emery, MD [place]

## 2022-04-19 NOTE — Progress Notes (Signed)
    SUBJECTIVE:   Chief compliant/HPI: annual examination  Grace Lyons is a 61 y.o. who presents today for an annual exam.   Heart Palpitations:  Complains of heart palpitations that occur with anxiety, no true chest pain  No SOB associated either Happening for a couple of days and intermittently throughout the day Happened when COVID started as well but went away  Financial stress and stressors of all sort seem to contribute to this. Patient notes a correlation of these symptoms with anxious mood   History tabs reviewed and updated.   Review of systems form reviewed and notable for: See above.   OBJECTIVE:   BP 133/82   Pulse 63   Ht $R'5\' 2"'rJ$  (1.575 m)   Wt 167 lb 6 oz (75.9 kg)   LMP 08/14/2012   SpO2 100%   BMI 30.61 kg/m   General: Alert and oriented in no apparent distress; pleasant female  HEENT: PERRLA without any significant abnormalities  Heart: Regular rate and rhythm with no murmurs appreciated Lungs: CTA bilaterally, no wheezing Abdomen: Bowel sounds present, no abdominal pain Skin: Warm and dry Extremities: No lower extremity edema Foot exam:  PT and DP pulses intact  No foot ulcers or hx of them  Nl skin temp  No heavy callous build up  Nl appearing toenails- not ingrown   ASSESSMENT/PLAN:   No problem-specific Assessment & Plan notes found for this encounter.    Annual Examination  See AVS for age appropriate recommendations  PHQ score reviewed and discussed.     04/19/2022    1:22 PM 03/14/2022    8:45 AM 01/14/2022   11:33 AM  PHQ9 SCORE ONLY  PHQ-9 Total Score 5 0 4    BP reviewed and at goal.  Asked about intimate partner violence and resources given as appropriate   Considered the following items based upon USPSTF recommendations: Diabetes screening: ordered Screening for elevated cholesterol: 04/14/21 total 144, HDL 64, LDL 68, triglycerides 58 HIV testing:  NR  1 year ago Hepatitis C:  negative 7 years ago Hepatitis B: N/A Syphilis  if at high risk:  not high risk  GC/CT not at high risk and not ordered. Osteoporosis screening considered based upon risk of fracture from [facility] calculator. Major osteoporotic fracture risk is 6.9%. DEXA not ordered.  Reviewed risk factors for latent tuberculosis and not indicated  Discussed family history, BRCA testing not indicated.  Cervical cancer screening: performed at [facility], will obtain records  Breast cancer screening:  UTD Colorectal cancer screening: up to date on screening for CRC. Lung cancer screening:  Not indicated . See documentation below regarding indications/risks/benefits.  Vaccinations none indicated today   Heart Palpitations:  EKG reassuring against arrhythmia/ischemia  No hemodynamic instability  CBC, CMP, TSH ordered for further assessment although unlikely to have electrolyte derangement, anemia, or thyroid dysfunction give lack of other symptoms and physical exam   Prediabetes:  A1C ordered in addition to Vit D as patient is taking Vit D supplement  Ordered lipid panel as well  Microalbumin ratio ordered   Requested optometry records from [facility]   Follow up in 1 year or sooner if indicated.    Erskine Emery, MD [place]

## 2022-04-20 LAB — CBC
Hematocrit: 38.8 % (ref 34.0–46.6)
Hemoglobin: 12.9 g/dL (ref 11.1–15.9)
MCH: 30.9 pg (ref 26.6–33.0)
MCHC: 33.2 g/dL (ref 31.5–35.7)
MCV: 93 fL (ref 79–97)
Platelets: 268 10*3/uL (ref 150–450)
RBC: 4.18 x10E6/uL (ref 3.77–5.28)
RDW: 12.8 % (ref 11.7–15.4)
WBC: 5.4 10*3/uL (ref 3.4–10.8)

## 2022-04-20 LAB — COMPREHENSIVE METABOLIC PANEL
ALT: 23 IU/L (ref 0–32)
AST: 20 IU/L (ref 0–40)
Albumin/Globulin Ratio: 2.4 — ABNORMAL HIGH (ref 1.2–2.2)
Albumin: 4.8 g/dL (ref 3.9–4.9)
Alkaline Phosphatase: 85 IU/L (ref 44–121)
BUN/Creatinine Ratio: 18 (ref 12–28)
BUN: 13 mg/dL (ref 8–27)
Bilirubin Total: 0.5 mg/dL (ref 0.0–1.2)
CO2: 22 mmol/L (ref 20–29)
Calcium: 10.6 mg/dL — ABNORMAL HIGH (ref 8.7–10.3)
Chloride: 106 mmol/L (ref 96–106)
Creatinine, Ser: 0.71 mg/dL (ref 0.57–1.00)
Globulin, Total: 2 g/dL (ref 1.5–4.5)
Glucose: 86 mg/dL (ref 70–99)
Potassium: 4.3 mmol/L (ref 3.5–5.2)
Sodium: 141 mmol/L (ref 134–144)
Total Protein: 6.8 g/dL (ref 6.0–8.5)
eGFR: 97 mL/min/{1.73_m2} (ref >59)

## 2022-04-20 LAB — T4F: T4,Free (Direct): 1.28 ng/dL (ref 0.82–1.77)

## 2022-04-20 LAB — LIPID PANEL
Chol/HDL Ratio: 2.3 ratio (ref 0.0–4.4)
Cholesterol, Total: 152 mg/dL (ref 100–199)
HDL: 65 mg/dL (ref >39)
LDL Chol Calc (NIH): 77 mg/dL (ref 0–99)
Triglycerides: 43 mg/dL (ref 0–149)
VLDL Cholesterol Cal: 10 mg/dL (ref 5–40)

## 2022-04-20 LAB — MICROALBUMIN / CREATININE URINE RATIO
Creatinine, Urine: 118.6 mg/dL
Microalb/Creat Ratio: <3 mg/g creat (ref 0–29)
Microalbumin, Urine: <3 ug/mL

## 2022-04-20 LAB — VITAMIN D 25 HYDROXY (VIT D DEFICIENCY, FRACTURES): Vit D, 25-Hydroxy: 28.8 ng/mL — ABNORMAL LOW (ref 30.0–100.0)

## 2022-04-20 LAB — TSH RFX ON ABNORMAL TO FREE T4: TSH: 0.415 u[IU]/mL — ABNORMAL LOW (ref 0.450–4.500)

## 2022-04-27 ENCOUNTER — Telehealth: Payer: Self-pay | Admitting: Student

## 2022-04-27 NOTE — Telephone Encounter (Signed)
Letter sent today for results

## 2022-06-22 ENCOUNTER — Ambulatory Visit (INDEPENDENT_AMBULATORY_CARE_PROVIDER_SITE_OTHER): Payer: Commercial Managed Care - HMO

## 2022-06-22 DIAGNOSIS — Z23 Encounter for immunization: Secondary | ICD-10-CM | POA: Diagnosis not present

## 2022-06-23 NOTE — Progress Notes (Signed)
Patient presents to nurse clinic for flu vaccination. Administered in LD, site unremarkable, tolerated injection well.   Grace Lyons C Grace Maranto, RN   

## 2022-08-23 ENCOUNTER — Ambulatory Visit: Payer: 59 | Admitting: Family Medicine

## 2022-08-23 ENCOUNTER — Encounter: Payer: Self-pay | Admitting: Family Medicine

## 2022-08-23 VITALS — BP 110/68 | HR 77 | Ht 62.0 in | Wt 162.4 lb

## 2022-08-23 DIAGNOSIS — M542 Cervicalgia: Secondary | ICD-10-CM

## 2022-08-23 MED ORDER — MELOXICAM 15 MG PO TABS: 15.0000 mg | ORAL_TABLET | Freq: Every day | ORAL | 0 refills | Status: DC

## 2022-08-23 MED ORDER — IBUPROFEN 600 MG PO TABS: 600.0000 mg | ORAL_TABLET | Freq: Three times a day (TID) | ORAL | 0 refills | Status: DC | PRN

## 2022-08-23 NOTE — Progress Notes (Signed)
    SUBJECTIVE:   CHIEF COMPLAINT / HPI:   Neck Pain -Started 6 days ago -Left-sided -Woke up with the pain -No preceding injury that she is aware of although has been lifting her mom's chair on dialysis days (~15 lbs) -Taking ibuprofen 600 mg which helps -Using heating pad at night which she also finds helpful -Tried a tramadol, unsure how much that helped -No numbness, weakness -Had CT neck in August 2022 with degenerative changes C5-C6 and C6-C7 -Has tried multiple muscle relaxers for various issues in the past (tizanidine, flexeril, robaxin).  "Does not do well with them"  PERTINENT  PMH / PSH: Prediabetes, osteoarthritis  OBJECTIVE:   BP 110/68   Pulse 77   Ht '5\' 2"'$  (1.575 m)   Wt 162 lb 6.4 oz (73.7 kg)   LMP 08/14/2012   SpO2 99%   BMI 29.70 kg/m   Gen: NAD, pleasant, able to participate in exam Neck: Inspection is normal without obvious deformity or skin changes, no midline tenderness to palpation, moderate tenderness to palpation of left paracervical musculature, mildly decreased ROM with flexion, extension, and leftward rotation secondary to pain, NVI distally CV: RRR, normal S1/S2, no murmur Resp: Normal effort, lungs CTAB Extremities: Left shoulder nontender to palpation, FROM Skin: warm and dry, no rashes noted Neuro: alert, no obvious focal deficits Psych: Normal affect and mood   ASSESSMENT/PLAN:   Neck pain Acute x6 days, nontraumatic. Suspect cervical muscle strain.  No red flags on history or exam.  Rx sent for meloxicam 15 mg daily.  Also sent refills on her ibuprofen 600 mg tablets per her request, counseled extensively on not using together with the meloxicam.  Continue heating pad use.  Offered short course of muscle relaxer but patient politely declines due to prior adverse reactions.  Return if no improvement.   Alcus Dad, MD [place]

## 2022-08-23 NOTE — Patient Instructions (Signed)
It was great to meet you!  You strained the muscles in your neck.  This should gradually get better over the next week.  I have sent a prescription anti-inflammatory medication called meloxicam to your pharmacy.  Take this once daily.   I also sent refills on your ibuprofen.  However it is very important you DO NOT take ibuprofen/Motrin/Advil/Aleve together with the meloxicam.  Continue using the heating pad a few times per day.  Try to avoid lifting or other aggravating factors is much as possible.  Take care, Dr. Rock Nephew

## 2022-10-16 ENCOUNTER — Ambulatory Visit (INDEPENDENT_AMBULATORY_CARE_PROVIDER_SITE_OTHER): Payer: 59

## 2022-10-16 ENCOUNTER — Encounter (HOSPITAL_COMMUNITY): Payer: Self-pay

## 2022-10-16 ENCOUNTER — Ambulatory Visit (HOSPITAL_COMMUNITY)
Admission: EM | Admit: 2022-10-16 | Discharge: 2022-10-16 | Disposition: A | Discharge status: Home / Self Care | Payer: 59 | Attending: Emergency Medicine | Admitting: Emergency Medicine

## 2022-10-16 DIAGNOSIS — J069 Acute upper respiratory infection, unspecified: Secondary | ICD-10-CM

## 2022-10-16 DIAGNOSIS — R0602 Shortness of breath: Secondary | ICD-10-CM | POA: Diagnosis not present

## 2022-10-16 DIAGNOSIS — R059 Cough, unspecified: Secondary | ICD-10-CM

## 2022-10-16 MED ORDER — PROMETHAZINE-DM 6.25-15 MG/5ML PO SYRP: 5.0000 mL | ORAL_SOLUTION | Freq: Four times a day (QID) | ORAL | 0 refills | Status: DC | PRN

## 2022-10-16 MED ORDER — BENZONATATE 100 MG PO CAPS: 100.0000 mg | ORAL_CAPSULE | Freq: Three times a day (TID) | ORAL | 0 refills | Status: DC

## 2022-10-16 MED ORDER — ALBUTEROL SULFATE HFA 108 (90 BASE) MCG/ACT IN AERS: 2.0000 | INHALATION_SPRAY | RESPIRATORY_TRACT | 0 refills | Status: DC | PRN

## 2022-10-16 MED ORDER — PREDNISONE 20 MG PO TABS: 40.0000 mg | ORAL_TABLET | Freq: Every day | ORAL | 0 refills | Status: DC

## 2022-10-16 NOTE — ED Provider Notes (Signed)
[facility]    CSN: [id number] Arrival date & time: 10/16/22  1029      History   Chief Complaint Chief Complaint  Patient presents with   Cough   Shortness of Breath    HPI Grace Lyons is a 62 y.o. female.   Patient presents for evaluation of nasal congestion, rhinorrhea, sore throat, cough, shortness of breath and wheezing present for 7 days.  Cough is productive with yellow sputum, shortness of breath occurring primarily with coughing.  Believes symptoms to be worsening as she heard a rattling in her chest overnight.  Decreased appetite but tolerating some food and liquids.  No known sick contacts prior.  Believes symptoms to be related to brain exposure over the past week.  Has attempted use of Mucinex, Robitussin DM and Zyrtec which has been minimally effective.  History of childhood asthma, has had pneumonia and bronchitis in the past.  Denies fevers.     Past Medical History:  Diagnosis Date   Arthritis    Cervical polyp    Degenerative arthritis of knee, bilateral 12/09/2015   Injections 12/09/2015 Started Orthovisc left knee 02/03/2016    Fibroid    H/O left knee surgery 09/2017   Knee pain, left 12/29/2010   Menometrorrhagia    Vitamin D deficiency     Patient Active Problem List   Diagnosis Date Noted   Alcohol use 04/16/2021   Mood disturbance 04/16/2021   Posterior tibial tendinitis of left lower extremity 10/19/2020   Healthcare maintenance 08/29/2019   Left lumbar radiculopathy 05/07/2019   Greater trochanteric bursitis of left hip 05/07/2019   Instability of right knee joint 01/08/2019   Palpitations 11/09/2018   Menometrorrhagia 03/13/2018   Polyp of cervix 03/13/2018   Refuses statin 03/13/2018   Instability of left knee joint 07/05/2017   Other bilateral secondary osteoarthritis of knee 11/26/2014   Pes planus of both feet 06/20/2014   Loss of transverse plantar arch 06/20/2014   Prediabetes 12/23/2009   Vitamin D deficiency  12/23/2009    Past Surgical History:  Procedure Laterality Date   ENDOMETRIAL BIOPSY  12/17/2008   HYSTEROSCOPY WITH D & C  08/31/2012   Procedure: DILATATION AND CURETTAGE /HYSTEROSCOPY;  Surgeon: Delice Lesch, MD;  Location: [facility];  Service: Gynecology;  Laterality: N/A;  with resectoscope   KNEE SURGERY     TUBAL LIGATION  1995    OB History     Gravida  1   Para      Term      Preterm      AB      Living  1      SAB      IAB      Ectopic      Multiple      Live Births               Home Medications    Prior to Admission medications   Medication Sig Start Date End Date Taking? Authorizing Provider  Cholecalciferol (VITAMIN D3) 1000 UNITS CAPS Take 1,000 Units by mouth daily.   Yes [provider]  hydrocortisone 2.5 % ointment Apply topically 2 (two) times daily. As needed for mild eczema.  Do not use for more than 1-2 weeks at a time. 03/14/22  Yes Erskine Emery, MD  ibuprofen (ADVIL) 600 MG tablet Take 1 tablet (600 mg total) by mouth every 8 (eight) hours as needed. 08/23/22  Yes Alcus Dad, MD  mineral oil-hydrophilic petrolatum (  AQUAPHOR) ointment Apply topically as needed for dry skin. 02/05/22  Yes Stanhope, Stasia Cavalier, FNP  Omega-3 Fatty Acids (OMEGA 3 PO) Take 1 capsule by mouth daily.    Yes [provider]  traMADol (ULTRAM) 50 MG tablet Take 1 tablet (50 mg total) by mouth every 8 (eight) hours as needed for severe pain. 08/04/21  Yes Gregor Hams, MD  meloxicam (MOBIC) 15 MG tablet Take 1 tablet (15 mg total) by mouth daily. Do not take Ibuprofen when taking this medication. 08/23/22   Alcus Dad, MD    Family History Family History  Problem Relation Age of Onset   Diabetes Mother    Hypertension Mother    Heart disease Mother    Diabetes Father    Hypertension Father    Prostate cancer Father    Osteoarthritis Brother    Mental illness Son     Social History Social History   Tobacco Use   Smoking  status: Former    Types: Cigarettes    Quit date: 01/12/1996    Years since quitting: 26.7   Smokeless tobacco: Never  Vaping Use   Vaping Use: Never used  Substance Use Topics   Alcohol use: Yes   Drug use: No     Allergies   Gabapentin, Doxycycline, and Lidocaine   Review of Systems Review of Systems  Constitutional: Negative.   HENT:  Positive for congestion, rhinorrhea and sore throat. Negative for dental problem, drooling, ear discharge, ear pain, facial swelling, hearing loss, mouth sores, nosebleeds, postnasal drip, sinus pressure, sinus pain, sneezing, tinnitus, trouble swallowing and voice change.   Respiratory:  Positive for cough, shortness of breath and wheezing. Negative for apnea, choking, chest tightness and stridor.   Gastrointestinal: Negative.   Skin: Negative.   Neurological: Negative.      Physical Exam Triage Vital Signs ED Triage Vitals  Enc Vitals Group     BP 10/16/22 1141 136/75     Pulse Rate 10/16/22 1141 94     Resp 10/16/22 1141 16     Temp 10/16/22 1141 99 F (37.2 C)     Temp Source 10/16/22 1141 Oral     SpO2 10/16/22 1141 95 %     Weight 10/16/22 1141 162 lb (73.5 kg)     Height 10/16/22 1141 '5\' 2"'$  (1.575 m)     Head Circumference --      Peak Flow --      Pain Score 10/16/22 1139 5     Pain Loc --      Pain Edu? --      Excl. in [place]? --    No data found.  Updated Vital Signs BP 136/75 (BP Location: Right Arm)   Pulse 94   Temp 99 F (37.2 C) (Oral)   Resp 16   Ht '5\' 2"'$  (1.575 m)   Wt 162 lb (73.5 kg)   LMP 08/14/2012   SpO2 95%   BMI 29.63 kg/m   Visual Acuity Right Eye Distance:   Left Eye Distance:   Bilateral Distance:    Right Eye Near:   Left Eye Near:    Bilateral Near:     Physical Exam Constitutional:      Appearance: Normal appearance.  HENT:     Right Ear: Tympanic membrane, ear canal and external ear normal.     Left Ear: Tympanic membrane, ear canal and external ear normal.     Nose: Congestion  and rhinorrhea present.  Mouth/Throat:     Mouth: Mucous membranes are moist.     Pharynx: No posterior oropharyngeal erythema.  Cardiovascular:     Rate and Rhythm: Normal rate and regular rhythm.     Pulses: Normal pulses.     Heart sounds: Normal heart sounds.  Pulmonary:     Effort: Pulmonary effort is normal.     Breath sounds: Normal breath sounds.  Skin:    General: Skin is warm and dry.  Neurological:     Mental Status: She is alert and oriented to person, place, and time. Mental status is at baseline.  Psychiatric:        Mood and Affect: Mood normal.        Behavior: Behavior normal.      UC Treatments / Results  Labs (all labs ordered are listed, but only abnormal results are displayed) Labs Reviewed - No data to display  EKG   Radiology No results found.  Procedures Procedures (including critical care time)  Medications Ordered in UC Medications - No data to display  Initial Impression / Assessment and Plan / UC Course  I have reviewed the triage vital signs and the nursing notes.  Pertinent labs & imaging results that were available during my care of the patient were reviewed by me and considered in my medical decision making (see chart for details).  Viral URI with cough  Patient is in no signs of distress nor toxic appearing.  Vital signs are stable.  Lungs are clear to auscultation and O2 saturation 95% on room air however,  As patient endorses recurrent bronchitis, chest x-ray completed, negative, discussed findings with patient, etiology is most likely viral.  Prescribed prednisone, albuterol inhaler, Tessalon and promethazine DM.  Viral testing deferred due to timeline of illness.May use additional over-the-counter medications as needed for supportive care.  May follow-up with urgent care as needed if symptoms persist or worsen.  Final Clinical Impressions(s) / UC Diagnoses   Final diagnoses:  None   Discharge Instructions   None    ED  Prescriptions   None    PDMP not reviewed this encounter.   Hans Eden, NP 10/16/22 1235

## 2022-10-16 NOTE — ED Triage Notes (Signed)
Chief Complaint: cough, congestion, productive cough with clear/yellow mucus, SOB. Patient has history of pneumonia    Onset: 7 days   Prescriptions or OTC medications tried: Yes- zyrtec, Mucinex, robitussin     with little relief  Sick exposure: No  New foods, medications, or products: No  Recent Travel: No

## 2022-10-16 NOTE — Discharge Instructions (Signed)
Your symptoms today are most likely being caused by a virus and should steadily improve in time it can take up to 7 to 10 days before you truly start to see a turnaround however things will get better  Chest x-ray is negative  Begin use of prednisone every morning with food for 5 days to help reduce inflammation to the airways, should help with the shortness of breath wheezing and coughing  You may use Tessalon pill every 8 hours as needed to help settle your coughing  You may use cough syrup every 6 hours as needed for additional comfort, be mindful this may make you feel drowsy  You can take Tylenol and/or Ibuprofen as needed for fever reduction and pain relief.   For cough: honey 1/2 to 1 teaspoon (you can dilute the honey in water or another fluid).  You can also use guaifenesin and dextromethorphan for cough. You can use a humidifier for chest congestion and cough.  If you don't have a humidifier, you can sit in the bathroom with the hot shower running.      For sore throat: try warm salt water gargles, cepacol lozenges, throat spray, warm tea or water with lemon/honey, popsicles or ice, or OTC cold relief medicine for throat discomfort.   For congestion: take a daily anti-histamine like Zyrtec, Claritin, and a oral decongestant, such as pseudoephedrine.  You can also use Flonase 1-2 sprays in each nostril daily.   It is important to stay hydrated: drink plenty of fluids (water, gatorade/powerade/pedialyte, juices, or teas) to keep your throat moisturized and help further relieve irritation/discomfort.

## 2022-10-18 ENCOUNTER — Ambulatory Visit: Payer: 59 | Admitting: Family Medicine

## 2022-11-03 ENCOUNTER — Telehealth: Payer: Self-pay

## 2022-11-03 MED ORDER — TRIAMCINOLONE ACETONIDE 0.1 % EX OINT: 1.0000 | TOPICAL_OINTMENT | Freq: Two times a day (BID) | CUTANEOUS | 0 refills | Status: DC

## 2022-11-03 NOTE — Telephone Encounter (Signed)
Sent to pharmacy. Please call patient and schedule for annual exam with Dr. Thompson Grayer. Dorris Singh, MD  Family Medicine Teaching Service

## 2022-11-03 NOTE — Telephone Encounter (Signed)
Patient calls nurse line requesting refill on triamcinolone cream. This is not on current medication list.   If appropriate, please send refill to [facility] on [address].   Talbot Grumbling, RN

## 2022-11-10 ENCOUNTER — Other Ambulatory Visit: Payer: Self-pay

## 2022-11-16 NOTE — Telephone Encounter (Signed)
I have sent a My Chart message to be sent in August to remind pt to schedule an annual exam. Ottis Stain, CMA

## 2022-11-28 ENCOUNTER — Encounter: Payer: Self-pay | Admitting: *Deleted

## 2023-01-16 ENCOUNTER — Other Ambulatory Visit: Payer: Self-pay | Admitting: Family Medicine

## 2023-01-16 DIAGNOSIS — Z1231 Encounter for screening mammogram for malignant neoplasm of breast: Secondary | ICD-10-CM

## 2023-01-23 ENCOUNTER — Other Ambulatory Visit: Payer: Self-pay | Admitting: Family Medicine

## 2023-02-23 ENCOUNTER — Ambulatory Visit
Admission: RE | Admit: 2023-02-23 | Discharge: 2023-02-23 | Disposition: A | Discharge status: Home / Self Care | Payer: 59 | Source: Ambulatory Visit | Attending: Family Medicine | Admitting: Family Medicine

## 2023-02-23 DIAGNOSIS — Z1231 Encounter for screening mammogram for malignant neoplasm of breast: Secondary | ICD-10-CM

## 2023-04-18 DIAGNOSIS — B353 Tinea pedis: Secondary | ICD-10-CM | POA: Diagnosis not present

## 2023-04-18 DIAGNOSIS — L308 Other specified dermatitis: Secondary | ICD-10-CM | POA: Diagnosis not present

## 2023-04-19 DIAGNOSIS — B356 Tinea cruris: Secondary | ICD-10-CM | POA: Diagnosis not present

## 2023-04-19 DIAGNOSIS — Z1331 Encounter for screening for depression: Secondary | ICD-10-CM | POA: Diagnosis not present

## 2023-04-19 DIAGNOSIS — Z1211 Encounter for screening for malignant neoplasm of colon: Secondary | ICD-10-CM | POA: Diagnosis not present

## 2023-04-19 DIAGNOSIS — Z139 Encounter for screening, unspecified: Secondary | ICD-10-CM | POA: Diagnosis not present

## 2023-04-19 DIAGNOSIS — Z1239 Encounter for other screening for malignant neoplasm of breast: Secondary | ICD-10-CM | POA: Diagnosis not present

## 2023-04-19 DIAGNOSIS — Z124 Encounter for screening for malignant neoplasm of cervix: Secondary | ICD-10-CM | POA: Diagnosis not present

## 2023-04-19 DIAGNOSIS — Z01419 Encounter for gynecological examination (general) (routine) without abnormal findings: Secondary | ICD-10-CM | POA: Diagnosis not present

## 2023-04-25 ENCOUNTER — Ambulatory Visit
Admission: RE | Admit: 2023-04-25 | Discharge: 2023-04-25 | Disposition: A | Discharge status: Home / Self Care | Payer: 59 | Source: Ambulatory Visit | Attending: Family Medicine | Admitting: Family Medicine

## 2023-04-25 ENCOUNTER — Encounter: Payer: Self-pay | Admitting: Student

## 2023-04-25 ENCOUNTER — Ambulatory Visit (INDEPENDENT_AMBULATORY_CARE_PROVIDER_SITE_OTHER): Payer: 59 | Admitting: Student

## 2023-04-25 VITALS — BP 130/70 | HR 65 | Ht 62.0 in | Wt 165.2 lb

## 2023-04-25 DIAGNOSIS — R0689 Other abnormalities of breathing: Secondary | ICD-10-CM | POA: Diagnosis not present

## 2023-04-25 DIAGNOSIS — R7303 Prediabetes: Secondary | ICD-10-CM | POA: Diagnosis not present

## 2023-04-25 DIAGNOSIS — M174 Other bilateral secondary osteoarthritis of knee: Secondary | ICD-10-CM

## 2023-04-25 DIAGNOSIS — Z23 Encounter for immunization: Secondary | ICD-10-CM | POA: Diagnosis not present

## 2023-04-25 DIAGNOSIS — R21 Rash and other nonspecific skin eruption: Secondary | ICD-10-CM

## 2023-04-25 DIAGNOSIS — Z0000 Encounter for general adult medical examination without abnormal findings: Secondary | ICD-10-CM

## 2023-04-25 MED ORDER — IBUPROFEN 600 MG PO TABS: 600.0000 mg | ORAL_TABLET | Freq: Three times a day (TID) | ORAL | 0 refills | Status: DC | PRN

## 2023-04-25 MED ORDER — TRAMADOL HCL 50 MG PO TABS: 50.0000 mg | ORAL_TABLET | Freq: Three times a day (TID) | ORAL | 0 refills | Status: DC | PRN

## 2023-04-25 MED ORDER — HYDROCORTISONE 2.5 % EX OINT: TOPICAL_OINTMENT | Freq: Two times a day (BID) | CUTANEOUS | 3 refills | Status: DC

## 2023-04-25 NOTE — Assessment & Plan Note (Addendum)
Recheck A1C 
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
## 2023-04-25 NOTE — Patient Instructions (Addendum)
It was great to see you today! Thank you for choosing [facility] for your primary care.  Today we addressed: Go to [facility] for chest xray I have ordered some labs as well I have refilled your meds to the pharmacy    If you haven't already, sign up for My Chart to have easy access to your labs results, and communication with your primary care physician. I recommend that you always bring your medications to each appointment as this makes it easy to ensure you are on the correct medications and helps [place] not miss refills when you need them. Call the clinic at [phone] if your symptoms worsen or you have any concerns. Return in about 3 months (around 07/25/2023) for Chest Rattling . Please arrive 15 minutes before your appointment to ensure smooth check in process.  We appreciate your efforts in making this happen.  Thank you for allowing me to participate in your care, Alfredo Martinez, MD 04/25/2023, 2:12 PM PGY-3, [facility]

## 2023-04-25 NOTE — Assessment & Plan Note (Addendum)
Seemingly OA bilaterally, no red flags. Reordered Tramadol and Ibuprofen as she takes very sparingly, PMDP checked. Continue with exercise, low impact exercises discussed.

## 2023-04-26 LAB — LIPID PANEL
Chol/HDL Ratio: 2.3 ratio (ref 0.0–4.4)
Cholesterol, Total: 160 mg/dL (ref 100–199)
HDL: 69 mg/dL (ref >39)
LDL Chol Calc (NIH): 79 mg/dL (ref 0–99)
Triglycerides: 59 mg/dL (ref 0–149)
VLDL Cholesterol Cal: 12 mg/dL (ref 5–40)

## 2023-04-26 LAB — CBC WITH DIFFERENTIAL/PLATELET
Basophils Absolute: 0 10*3/uL (ref 0.0–0.2)
Basos: 1 %
EOS (ABSOLUTE): 0.2 10*3/uL (ref 0.0–0.4)
Eos: 4 %
Hematocrit: 39.1 % (ref 34.0–46.6)
Hemoglobin: 12.7 g/dL (ref 11.1–15.9)
Immature Grans (Abs): 0 10*3/uL (ref 0.0–0.1)
Immature Granulocytes: 0 %
Lymphocytes Absolute: 1.6 10*3/uL (ref 0.7–3.1)
Lymphs: 29 %
MCH: 31.1 pg (ref 26.6–33.0)
MCHC: 32.5 g/dL (ref 31.5–35.7)
MCV: 96 fL (ref 79–97)
Monocytes Absolute: 0.6 10*3/uL (ref 0.1–0.9)
Monocytes: 11 %
Neutrophils Absolute: 3 10*3/uL (ref 1.4–7.0)
Neutrophils: 55 %
Platelets: 223 10*3/uL (ref 150–450)
RBC: 4.09 x10E6/uL (ref 3.77–5.28)
RDW: 13.1 % (ref 11.7–15.4)
WBC: 5.5 10*3/uL (ref 3.4–10.8)

## 2023-04-26 LAB — COMPREHENSIVE METABOLIC PANEL
ALT: 23 IU/L (ref 0–32)
AST: 21 IU/L (ref 0–40)
Albumin: 4.4 g/dL (ref 3.9–4.9)
Alkaline Phosphatase: 79 IU/L (ref 44–121)
BUN/Creatinine Ratio: 19 (ref 12–28)
BUN: 15 mg/dL (ref 8–27)
Bilirubin Total: 0.7 mg/dL (ref 0.0–1.2)
CO2: 20 mmol/L (ref 20–29)
Calcium: 10.1 mg/dL (ref 8.7–10.3)
Chloride: 104 mmol/L (ref 96–106)
Creatinine, Ser: 0.78 mg/dL (ref 0.57–1.00)
Globulin, Total: 2.5 g/dL (ref 1.5–4.5)
Glucose: 78 mg/dL (ref 70–99)
Potassium: 4 mmol/L (ref 3.5–5.2)
Sodium: 139 mmol/L (ref 134–144)
Total Protein: 6.9 g/dL (ref 6.0–8.5)
eGFR: 86 mL/min/{1.73_m2} (ref >59)

## 2023-04-26 LAB — HEMOGLOBIN A1C
Est. average glucose Bld gHb Est-mCnc: 128 mg/dL
Hgb A1c MFr Bld: 6.1 % — ABNORMAL HIGH (ref 4.8–5.6)

## 2023-05-03 NOTE — Progress Notes (Signed)
Reviewed CXR, no acute findings per my review but awaiting official read.

## 2023-05-05 ENCOUNTER — Encounter (HOSPITAL_COMMUNITY): Payer: Self-pay | Admitting: Emergency Medicine

## 2023-05-05 ENCOUNTER — Ambulatory Visit (HOSPITAL_COMMUNITY)
Admission: EM | Admit: 2023-05-05 | Discharge: 2023-05-05 | Disposition: A | Discharge status: Home / Self Care | Payer: 59 | Attending: Family Medicine | Admitting: Family Medicine

## 2023-05-05 ENCOUNTER — Ambulatory Visit (INDEPENDENT_AMBULATORY_CARE_PROVIDER_SITE_OTHER): Payer: 59

## 2023-05-05 ENCOUNTER — Other Ambulatory Visit: Payer: Self-pay

## 2023-05-05 DIAGNOSIS — R059 Cough, unspecified: Secondary | ICD-10-CM | POA: Diagnosis not present

## 2023-05-05 DIAGNOSIS — R062 Wheezing: Secondary | ICD-10-CM | POA: Diagnosis not present

## 2023-05-05 DIAGNOSIS — J40 Bronchitis, not specified as acute or chronic: Secondary | ICD-10-CM | POA: Diagnosis not present

## 2023-05-05 DIAGNOSIS — J329 Chronic sinusitis, unspecified: Secondary | ICD-10-CM

## 2023-05-05 MED ORDER — IPRATROPIUM-ALBUTEROL 0.5-2.5 (3) MG/3ML IN SOLN
3.0000 mL | Freq: Once | RESPIRATORY_TRACT | Status: AC
  Administered 2023-05-05: 3 mL via RESPIRATORY_TRACT

## 2023-05-05 MED ORDER — IPRATROPIUM-ALBUTEROL 0.5-2.5 (3) MG/3ML IN SOLN
RESPIRATORY_TRACT | Status: AC
  Filled 2023-05-05: qty 3

## 2023-05-05 MED ORDER — HYDROCOD POLI-CHLORPHE POLI ER 10-8 MG/5ML PO SUER: 5.0000 mL | Freq: Two times a day (BID) | ORAL | 0 refills | Status: DC

## 2023-05-05 MED ORDER — PREDNISONE 50 MG PO TABS: 50.0000 mg | ORAL_TABLET | Freq: Every day | ORAL | 0 refills | Status: AC

## 2023-05-05 MED ORDER — AMOXICILLIN-POT CLAVULANATE 875-125 MG PO TABS: 1.0000 | ORAL_TABLET | Freq: Two times a day (BID) | ORAL | 0 refills | Status: DC

## 2023-05-05 NOTE — Discharge Instructions (Signed)
You were seen today for cough and wheezing.  I am treating you today as a bronchitis with a cough medication and prednisone.  I have also sent an antibiotic to your pharmacy.  Please follow up with your primary care provider to discuss further testing and treatment if needed.  Please return if having worsening symptoms.

## 2023-05-05 NOTE — ED Triage Notes (Signed)
Pt reports for several weeks having a rattle in her chest, at night having wheezing and horrible cough. Pt coughing up white phlegm. Pt reports that using inhaler, tea, lemon, steam for cough but not helping. Pt reports chest pains due to coughing so much.  Pt has seen her PCP who didn't hear the rattle, but did order her a chest xray. Pt reports the chest xray was 10 days ago and was good, but would like another one today.  Pt's partner states that at night the wheezing, coughing is terrible and has videoed her sleeping.

## 2023-05-05 NOTE — Telephone Encounter (Signed)
Patient LVM on nurse line this AM regarding CXR results.   Returned call to patient. Advised patient of negative results per note from Dr. Jena Gauss.   Patient was also seen in urgent care today for continued symptoms and was prescribed abx.   Patient has follow up scheduled with PCP on 10/2. She states that she will call back if she improves and no longer needs appointment.   Veronda Prude, RN

## 2023-05-05 NOTE — ED Provider Notes (Signed)
[facility]    CSN: [id number] Arrival date & time: 05/05/23  3295      History   Chief Complaint Chief Complaint  Patient presents with   Cough    HPI Grace Lyons is a 62 y.o. female.    Cough Associated symptoms: rhinorrhea, shortness of breath and wheezing    Patient is here for cough, chest pain, rattling in her chest, and SOB.   She did see her pcp on 9/10, the chest xray was normal.  She states her symptoms have been worsening.  She could not sleep last night.  She has thick, white mucous that she is coughing up.  She has had "rattling" in her chest for the last 2 months.  No fevers/chills.  Taking mucinex, albuterol HFA, cough drops, honey, tea, lemon.         Past Medical History:  Diagnosis Date   Arthritis    Cervical polyp    Degenerative arthritis of knee, bilateral 12/09/2015   Injections 12/09/2015 Started Orthovisc left knee 02/03/2016    Fibroid    H/O left knee surgery 09/2017   Knee pain, left 12/29/2010   Menometrorrhagia    Vitamin D deficiency     Patient Active Problem List   Diagnosis Date Noted   Alcohol use 04/16/2021   Mood disturbance 04/16/2021   Healthcare maintenance 08/29/2019   Palpitations 11/09/2018   Menometrorrhagia 03/13/2018   Other bilateral secondary osteoarthritis of knee 11/26/2014   Prediabetes 12/23/2009   Vitamin D deficiency 12/23/2009    Past Surgical History:  Procedure Laterality Date   ENDOMETRIAL BIOPSY  12/17/2008   HYSTEROSCOPY WITH D & C  08/31/2012   Procedure: DILATATION AND CURETTAGE /HYSTEROSCOPY;  Surgeon: Purcell Nails, MD;  Location: [facility];  Service: Gynecology;  Laterality: N/A;  with resectoscope   KNEE SURGERY     TUBAL LIGATION  1995    OB History     Gravida  1   Para      Term      Preterm      AB      Living  1      SAB      IAB      Ectopic      Multiple      Live Births               Home Medications    Prior to Admission  medications   Medication Sig Start Date End Date Taking? Authorizing Provider  albuterol (VENTOLIN HFA) 108 (90 Base) MCG/ACT inhaler Inhale 2 puffs into the lungs every 4 (four) hours as needed for wheezing or shortness of breath. 10/16/22   White, Elita Boone, NP  benzonatate (TESSALON) 100 MG capsule Take 1 capsule (100 mg total) by mouth every 8 (eight) hours. 10/16/22   Valinda Hoar, NP  Cholecalciferol (VITAMIN D3) 1000 UNITS CAPS Take 1,000 Units by mouth daily.    [provider]  hydrocortisone 2.5 % ointment Apply topically 2 (two) times daily. As needed for mild eczema.  Do not use for more than 1-2 weeks at a time. 04/25/23   Alfredo Martinez, MD  ibuprofen (ADVIL) 600 MG tablet Take 1 tablet (600 mg total) by mouth every 8 (eight) hours as needed. 04/25/23   Alfredo Martinez, MD  mineral oil-hydrophilic petrolatum (AQUAPHOR) ointment Apply topically as needed for dry skin. 02/05/22   Carlisle Beers, FNP  Omega-3 Fatty Acids (OMEGA 3 PO) Take 1 capsule  by mouth daily.     [provider]  traMADol (ULTRAM) 50 MG tablet Take 1 tablet (50 mg total) by mouth every 8 (eight) hours as needed for severe pain. 04/25/23   Alfredo Martinez, MD    Family History Family History  Problem Relation Age of Onset   Diabetes Mother    Hypertension Mother    Heart disease Mother    Diabetes Father    Hypertension Father    Prostate cancer Father    Osteoarthritis Brother    Mental illness Son     Social History Social History   Tobacco Use   Smoking status: Former    Current packs/day: 0.00    Types: Cigarettes    Quit date: 01/12/1996    Years since quitting: 27.3   Smokeless tobacco: Never  Vaping Use   Vaping status: Never Used  Substance Use Topics   Alcohol use: Yes   Drug use: No     Allergies   Gabapentin, Doxycycline, and Lidocaine   Review of Systems Review of Systems  Constitutional:  Positive for fatigue.  HENT:  Positive for congestion and  rhinorrhea.   Respiratory:  Positive for cough, shortness of breath and wheezing.   Gastrointestinal: Negative.   Musculoskeletal: Negative.   Psychiatric/Behavioral: Negative.       Physical Exam Triage Vital Signs ED Triage Vitals  Encounter Vitals Group     BP 05/05/23 0933 (!) 144/86     Systolic BP Percentile --      Diastolic BP Percentile --      Pulse Rate 05/05/23 0933 79     Resp 05/05/23 0933 18     Temp 05/05/23 0933 98.6 F (37 C)     Temp Source 05/05/23 0933 Oral     SpO2 05/05/23 0933 93 %     Weight --      Height --      Head Circumference --      Peak Flow --      Pain Score 05/05/23 0934 6     Pain Loc --      Pain Education --      Exclude from Growth Chart --    No data found.  Updated Vital Signs BP (!) 144/86 (BP Location: Left Arm)   Pulse 79   Temp 98.6 F (37 C) (Oral)   Resp 18   LMP 08/14/2012   SpO2 93%   Visual Acuity Right Eye Distance:   Left Eye Distance:   Bilateral Distance:    Right Eye Near:   Left Eye Near:    Bilateral Near:     Physical Exam Constitutional:      Appearance: Normal appearance.  HENT:     Nose: Congestion and rhinorrhea present.     Mouth/Throat:     Mouth: Mucous membranes are moist.  Cardiovascular:     Rate and Rhythm: Normal rate and regular rhythm.  Pulmonary:     Breath sounds: Wheezing and rhonchi present.  Musculoskeletal:     Cervical back: Normal range of motion and neck supple.  Skin:    General: Skin is warm.  Neurological:     General: No focal deficit present.     Mental Status: She is alert.  Psychiatric:        Mood and Affect: Mood normal.      UC Treatments / Results  Labs (all labs ordered are listed, but only abnormal results are displayed) Labs Reviewed - No data  to display  EKG   Radiology No results found.  Procedures Procedures (including critical care time)  Medications Ordered in UC Medications  ipratropium-albuterol (DUONEB) 0.5-2.5 (3) MG/3ML  nebulizer solution 3 mL (has no administration in time range)   After nebulizer treatment patient felt better, still with slight scattered wheezing but better air movement.   Initial Impression / Assessment and Plan / UC Course  I have reviewed the triage vital signs and the nursing notes.  Pertinent labs & imaging results that were available during my care of the patient were reviewed by me and considered in my medical decision making (see chart for details).  Final Clinical Impressions(s) / UC Diagnoses   Final diagnoses:  Sinobronchitis  Wheezing     Discharge Instructions      You were seen today for cough and wheezing.  I am treating you today as a bronchitis with a cough medication and prednisone.  I have also sent an antibiotic to your pharmacy.  Please follow up with your primary care provider to discuss further testing and treatment if needed.  Please return if having worsening symptoms.     ED Prescriptions     Medication Sig Dispense Auth. Provider   amoxicillin-clavulanate (AUGMENTIN) 875-125 MG tablet Take 1 tablet by mouth every 12 (twelve) hours. 14 tablet Mohini Heathcock, MD   predniSONE (DELTASONE) 50 MG tablet Take 1 tablet (50 mg total) by mouth daily for 5 days. 5 tablet Celestine Bougie, MD   chlorpheniramine-HYDROcodone (TUSSIONEX) 10-8 MG/5ML Take 5 mLs by mouth 2 (two) times daily. 70 mL Jannifer Franklin, MD      PDMP not reviewed this encounter.   Jannifer Franklin, MD 05/05/23 1023

## 2023-05-12 ENCOUNTER — Ambulatory Visit (HOSPITAL_COMMUNITY)
Admission: EM | Admit: 2023-05-12 | Discharge: 2023-05-12 | Disposition: A | Discharge status: Home / Self Care | Payer: 59 | Attending: Family Medicine | Admitting: Family Medicine

## 2023-05-12 ENCOUNTER — Telehealth: Payer: Self-pay

## 2023-05-12 ENCOUNTER — Encounter (HOSPITAL_COMMUNITY): Payer: Self-pay | Admitting: *Deleted

## 2023-05-12 DIAGNOSIS — J4541 Moderate persistent asthma with (acute) exacerbation: Secondary | ICD-10-CM

## 2023-05-12 MED ORDER — IPRATROPIUM-ALBUTEROL 0.5-2.5 (3) MG/3ML IN SOLN
RESPIRATORY_TRACT | Status: AC
  Filled 2023-05-12: qty 3

## 2023-05-12 MED ORDER — IPRATROPIUM-ALBUTEROL 0.5-2.5 (3) MG/3ML IN SOLN
3.0000 mL | Freq: Once | RESPIRATORY_TRACT | Status: AC
  Administered 2023-05-12: 3 mL via RESPIRATORY_TRACT

## 2023-05-12 MED ORDER — HYDROCOD POLI-CHLORPHE POLI ER 10-8 MG/5ML PO SUER: 5.0000 mL | Freq: Two times a day (BID) | ORAL | 0 refills | Status: DC | PRN

## 2023-05-12 MED ORDER — PREDNISONE 20 MG PO TABS: ORAL_TABLET | ORAL | 0 refills | Status: DC

## 2023-05-12 MED ORDER — IPRATROPIUM-ALBUTEROL 0.5-2.5 (3) MG/3ML IN SOLN: 3.0000 mL | Freq: Four times a day (QID) | RESPIRATORY_TRACT | 0 refills | Status: AC | PRN

## 2023-05-12 MED ORDER — AZITHROMYCIN 250 MG PO TABS: ORAL_TABLET | ORAL | 0 refills | Status: DC

## 2023-05-12 NOTE — Telephone Encounter (Signed)
Received call from patient's partner regarding med refill.   Reports that patient had a "rough night" last night with coughing.   Please advise.   Veronda Prude, RN

## 2023-05-12 NOTE — ED Triage Notes (Signed)
Pt states she felt better after last OV when she was on the steroids and antibiotics but now she is out and feeling worse again. She would like more med. She states she has continued productive cough.

## 2023-05-12 NOTE — Telephone Encounter (Signed)
Patient presents to clinic to ask about prednisone refill.   Reports that cough improved while on prednisone then returned yesterday. Productive cough with clear mucus.    No other symptoms.   Veronda Prude, RN

## 2023-05-12 NOTE — Discharge Instructions (Addendum)
Use ipratropium-albuterol in the nebulizer every 6 hours as needed for shortness of breath or wheezing.  You were given 1 dose of this medicine in the nebulizer here in the clinic.  Take prednisone 20 mg--3 tabs daily x3 days, then 2 tabs daily x3 days, then 1 tab daily x3 days, then one half tab daily x3 days, then stop  Azithromycin 250 mg--take 2 orally the first day, then 1 daily for 4 more days.  Take Tussionex cough syrup-5 mL every 12 hours as needed for cough.  I am glad you are going to follow-up with primary care next week.

## 2023-05-12 NOTE — Telephone Encounter (Signed)
Patient calls nurse line requesting refill on prednisone.   Reports that cough improved while on prednisone then returned yesterday. Productive cough with clear mucus.   No other symptoms.   Patient has follow up with PCP on 05/17/23, however is requesting that message be sent to PCP regarding request.   Veronda Prude, RN

## 2023-05-12 NOTE — ED Provider Notes (Signed)
[facility]    CSN: [id number] Arrival date & time: 05/12/23  1734      History   Chief Complaint Chief Complaint  Patient presents with   Cough   Nasal Congestion    HPI Grace Lyons is a 62 y.o. female.    Cough Here for cough and congestion that have worsened in the last 24 hours.  She was seen here on September 20 for over 10 days of cough and congestion and wheezing.  Chest x-ray on September 20 was negative for infiltrate or pneumonia.  She was begun on 5 days of prednisone and 7 days of Augmentin.  She finished her Augmentin last night and yesterday during the day started feeling worse, when she had been off her prednisone for a couple of days.  No new fever  She states her sputum has become yellow  The Tussionex was helping but has not been helping as much when she was off the prednisone.  She will see her primary care on October 2.  Past Medical History:  Diagnosis Date   Arthritis    Cervical polyp    Degenerative arthritis of knee, bilateral 12/09/2015   Injections 12/09/2015 Started Orthovisc left knee 02/03/2016    Fibroid    H/O left knee surgery 09/2017   Knee pain, left 12/29/2010   Menometrorrhagia    Vitamin D deficiency     Patient Active Problem List   Diagnosis Date Noted   Alcohol use 04/16/2021   Mood disturbance 04/16/2021   Healthcare maintenance 08/29/2019   Palpitations 11/09/2018   Menometrorrhagia 03/13/2018   Other bilateral secondary osteoarthritis of knee 11/26/2014   Prediabetes 12/23/2009   Vitamin D deficiency 12/23/2009    Past Surgical History:  Procedure Laterality Date   ENDOMETRIAL BIOPSY  12/17/2008   HYSTEROSCOPY WITH D & C  08/31/2012   Procedure: DILATATION AND CURETTAGE /HYSTEROSCOPY;  Surgeon: Purcell Nails, MD;  Location: [facility];  Service: Gynecology;  Laterality: N/A;  with resectoscope   KNEE SURGERY     TUBAL LIGATION  1995    OB History     Gravida  1   Para      Term      Preterm       AB      Living  1      SAB      IAB      Ectopic      Multiple      Live Births               Home Medications    Prior to Admission medications   Medication Sig Start Date End Date Taking? Authorizing Provider  albuterol (VENTOLIN HFA) 108 (90 Base) MCG/ACT inhaler Inhale 2 puffs into the lungs every 4 (four) hours as needed for wheezing or shortness of breath. 10/16/22  Yes White, Adrienne R, NP  azithromycin (ZITHROMAX) 250 MG tablet Take first 2 tablets together, then 1 every day until finished. 05/12/23  Yes Neeti Knudtson, Janace Aris, MD  chlorpheniramine-HYDROcodone (TUSSIONEX) 10-8 MG/5ML Take 5 mLs by mouth every 12 (twelve) hours as needed for cough. 05/12/23  Yes Zenia Resides, MD  Cholecalciferol (VITAMIN D3) 1000 UNITS CAPS Take 1,000 Units by mouth daily.   Yes [provider]  hydrocortisone 2.5 % ointment Apply topically 2 (two) times daily. As needed for mild eczema.  Do not use for more than 1-2 weeks at a time. 04/25/23  Yes Alfredo Martinez, MD  ipratropium-albuterol (DUONEB) 0.5-2.5 (3) MG/3ML SOLN Take 3 mLs by nebulization every 6 (six) hours as needed. 05/12/23  Yes Zenia Resides, MD  mineral oil-hydrophilic petrolatum (AQUAPHOR) ointment Apply topically as needed for dry skin. 02/05/22  Yes Stanhope, Donavan Burnet, FNP  Omega-3 Fatty Acids (OMEGA 3 PO) Take 1 capsule by mouth daily.    Yes [provider]  predniSONE (DELTASONE) 20 MG tablet 3 tabs daily x3 days, then 2 tabs daily x3 days, then 1 tab daily x3 days, then one half tab daily x3 days, then stop 05/12/23  Yes Myrtie Leuthold, Janace Aris, MD  traMADol (ULTRAM) 50 MG tablet Take 1 tablet (50 mg total) by mouth every 8 (eight) hours as needed for severe pain. 04/25/23  Yes Alfredo Martinez, MD    Family History Family History  Problem Relation Age of Onset   Diabetes Mother    Hypertension Mother    Heart disease Mother    Diabetes Father    Hypertension Father    Prostate cancer  Father    Osteoarthritis Brother    Mental illness Son     Social History Social History   Tobacco Use   Smoking status: Former    Current packs/day: 0.00    Types: Cigarettes    Quit date: 01/12/1996    Years since quitting: 27.3   Smokeless tobacco: Never  Vaping Use   Vaping status: Never Used  Substance Use Topics   Alcohol use: Yes   Drug use: No     Allergies   Gabapentin, Doxycycline, and Lidocaine   Review of Systems Review of Systems  Respiratory:  Positive for cough.      Physical Exam Triage Vital Signs ED Triage Vitals  Encounter Vitals Group     BP 05/12/23 1823 (!) 157/89     Systolic BP Percentile --      Diastolic BP Percentile --      Pulse Rate 05/12/23 1823 76     Resp 05/12/23 1823 (!) 22     Temp 05/12/23 1823 98.5 F (36.9 C)     Temp Source 05/12/23 1823 Oral     SpO2 05/12/23 1823 96 %     Weight --      Height --      Head Circumference --      Peak Flow --      Pain Score 05/12/23 1822 0     Pain Loc --      Pain Education --      Exclude from Growth Chart --    No data found.  Updated Vital Signs BP (!) 157/89 (BP Location: Right Arm)   Pulse 76   Temp 98.5 F (36.9 C) (Oral)   Resp (!) 22   LMP 08/14/2012   SpO2 96%   Visual Acuity Right Eye Distance:   Left Eye Distance:   Bilateral Distance:    Right Eye Near:   Left Eye Near:    Bilateral Near:     Physical Exam Constitutional:      Appearance: She is not ill-appearing, toxic-appearing or diaphoretic.     Comments: Initially she is working to breathe, but she is able to speak in 5 word sentences.  HENT:     Mouth/Throat:     Mouth: Mucous membranes are moist.  Eyes:     Extraocular Movements: Extraocular movements intact.     Pupils: Pupils are equal, round, and reactive to light.  Cardiovascular:     Rate and  Rhythm: Normal rate and regular rhythm.     Heart sounds: No murmur heard. Pulmonary:     Breath sounds: No stridor. No rhonchi or rales.      Comments: On initial exam she has tight prolonged expiratory phase. Neurological:     General: No focal deficit present.     Mental Status: She is alert and oriented to person, place, and time.  Psychiatric:        Behavior: Behavior normal.      UC Treatments / Results  Labs (all labs ordered are listed, but only abnormal results are displayed) Labs Reviewed - No data to display  EKG   Radiology No results found.  Procedures Procedures (including critical care time)  Medications Ordered in UC Medications  ipratropium-albuterol (DUONEB) 0.5-2.5 (3) MG/3ML nebulizer solution 3 mL (3 mLs Nebulization Given 05/12/23 1903)    Initial Impression / Assessment and Plan / UC Course  I have reviewed the triage vital signs and the nursing notes.  Pertinent labs & imaging results that were available during my care of the patient were reviewed by me and considered in my medical decision making (see chart for details).        After DuoNeb, she is moving air much better, though her expiratory wheezes are much louder.  Longer prednisone taper is sent in as is a Z-Pak to treat any atypicals that might have been playing a part in her illness.  Tussionex is sent in for another few days worth.  She will see her primary care in about 5 days. Final Clinical Impressions(s) / UC Diagnoses   Final diagnoses:  Moderate persistent asthma with acute exacerbation     Discharge Instructions      Use ipratropium-albuterol in the nebulizer every 6 hours as needed for shortness of breath or wheezing.  You were given 1 dose of this medicine in the nebulizer here in the clinic.  Take prednisone 20 mg--3 tabs daily x3 days, then 2 tabs daily x3 days, then 1 tab daily x3 days, then one half tab daily x3 days, then stop  Azithromycin 250 mg--take 2 orally the first day, then 1 daily for 4 more days.  Take Tussionex cough syrup-5 mL every 12 hours as needed for cough.  I am glad you are going  to follow-up with primary care next week.     ED Prescriptions     Medication Sig Dispense Auth. Provider   ipratropium-albuterol (DUONEB) 0.5-2.5 (3) MG/3ML SOLN Take 3 mLs by nebulization every 6 (six) hours as needed. 360 mL Zenia Resides, MD   predniSONE (DELTASONE) 20 MG tablet 3 tabs daily x3 days, then 2 tabs daily x3 days, then 1 tab daily x3 days, then one half tab daily x3 days, then stop 20 tablet Shaunette Gassner, Janace Aris, MD   chlorpheniramine-HYDROcodone (TUSSIONEX) 10-8 MG/5ML Take 5 mLs by mouth every 12 (twelve) hours as needed for cough. 60 mL Zenia Resides, MD   azithromycin (ZITHROMAX) 250 MG tablet Take first 2 tablets together, then 1 every day until finished. 6 tablet Dequavious Harshberger, Janace Aris, MD      I have reviewed the PDMP during this encounter.   Zenia Resides, MD 05/12/23 [id number]

## 2023-05-12 NOTE — Addendum Note (Signed)
Addended by: Veronda Prude on: 05/12/2023 11:26 AM   Modules accepted: Orders

## 2023-05-17 ENCOUNTER — Encounter: Payer: Self-pay | Admitting: Student

## 2023-05-17 ENCOUNTER — Other Ambulatory Visit: Payer: Self-pay

## 2023-05-17 ENCOUNTER — Ambulatory Visit (INDEPENDENT_AMBULATORY_CARE_PROVIDER_SITE_OTHER): Payer: 59 | Admitting: Student

## 2023-05-17 VITALS — BP 156/91 | HR 57 | Ht 62.0 in | Wt 165.6 lb

## 2023-05-17 DIAGNOSIS — R051 Acute cough: Secondary | ICD-10-CM

## 2023-05-17 DIAGNOSIS — R7303 Prediabetes: Secondary | ICD-10-CM | POA: Diagnosis not present

## 2023-05-17 DIAGNOSIS — R030 Elevated blood-pressure reading, without diagnosis of hypertension: Secondary | ICD-10-CM

## 2023-05-17 NOTE — Assessment & Plan Note (Signed)
Check UACR for health maintenance
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
## 2023-05-17 NOTE — Patient Instructions (Addendum)
It was great to see you today! Thank you for choosing [facility] for your primary care.  Today we addressed: We will have you do PFTs (pulmonary function testing) with Dr. Raymondo Band in a few weeks  Come back in to see me in a month or so for annual wellness tasks  Keep taking the zyrtec   If you haven't already, sign up for My Chart to have easy access to your labs results, and communication with your primary care physician. I recommend that you always bring your medications to each appointment as this makes it easy to ensure you are on the correct medications and helps [place] not miss refills when you need them. Call the clinic at [phone] if your symptoms worsen or you have any concerns. Return in about 2 months (around 07/17/2023). Please arrive 15 minutes before your appointment to ensure smooth check in process.  We appreciate your efforts in making this happen.  Thank you for allowing me to participate in your care, Grace Martinez, MD 05/17/2023, 9:33 AM PGY-3, [facility]

## 2023-05-18 LAB — MICROALBUMIN / CREATININE URINE RATIO
Creatinine, Urine: 126.8 mg/dL
Microalb/Creat Ratio: 7 mg/g{creat} (ref 0–29)
Microalbumin, Urine: 8.7 ug/mL

## 2023-06-03 ENCOUNTER — Ambulatory Visit (HOSPITAL_COMMUNITY)
Admission: EM | Admit: 2023-06-03 | Discharge: 2023-06-03 | Disposition: A | Discharge status: Home / Self Care | Payer: 59 | Attending: Family Medicine | Admitting: Family Medicine

## 2023-06-03 ENCOUNTER — Encounter (HOSPITAL_COMMUNITY): Payer: Self-pay

## 2023-06-03 ENCOUNTER — Ambulatory Visit (HOSPITAL_COMMUNITY): Payer: 59

## 2023-06-03 DIAGNOSIS — J4541 Moderate persistent asthma with (acute) exacerbation: Secondary | ICD-10-CM

## 2023-06-03 MED ORDER — IPRATROPIUM-ALBUTEROL 0.5-2.5 (3) MG/3ML IN SOLN
RESPIRATORY_TRACT | Status: AC
  Filled 2023-06-03: qty 3

## 2023-06-03 MED ORDER — HYDROCODONE BIT-HOMATROP MBR 5-1.5 MG/5ML PO SOLN: 5.0000 mL | Freq: Four times a day (QID) | ORAL | 0 refills | Status: DC | PRN

## 2023-06-03 MED ORDER — PREDNISONE 20 MG PO TABS: 40.0000 mg | ORAL_TABLET | Freq: Every day | ORAL | 0 refills | Status: DC

## 2023-06-03 MED ORDER — IPRATROPIUM-ALBUTEROL 0.5-2.5 (3) MG/3ML IN SOLN
3.0000 mL | Freq: Once | RESPIRATORY_TRACT | Status: AC
  Administered 2023-06-03: 3 mL via RESPIRATORY_TRACT

## 2023-06-03 NOTE — ED Triage Notes (Signed)
Patient reports that she has a productive cough with yellow/clear sputum. Patient states she was good for 5-6 days after her last visit.  Patient states she has an appointment for a test Wednesday of next week.

## 2023-06-05 NOTE — ED Provider Notes (Signed)
[facility]   [id number] 06/03/23 Arrival Time: 1237  ASSESSMENT & PLAN:  1. Moderate persistent asthma with exacerbation    Declines CXR. Without resp distress. Feeling better after duoneb. No indication for hospital evaluation.  Meds ordered this encounter  Medications   ipratropium-albuterol (DUONEB) 0.5-2.5 (3) MG/3ML nebulizer solution 3 mL   predniSONE (DELTASONE) 20 MG tablet    Sig: Take 2 tablets (40 mg total) by mouth daily.    Dispense:  10 tablet    Refill:  0   HYDROcodone bit-homatropine (HYCODAN) 5-1.5 MG/5ML syrup    Sig: Take 5 mLs by mouth every 6 (six) hours as needed for cough.    Dispense:  90 mL    Refill:  0   Asthma precautions given. OTC symptom care as needed.  Recommend:  Follow-up Information     Alfredo Martinez, MD.   Specialty: Family Medicine Why: As needed. Contact information: [address] [place] [address] [phone]         [facility].   Specialty: Emergency Medicine Why: If worsening or failing to improve as anticipated. Contact information: [address] [place] [address] [phone]                Reviewed expectations re: course of current medical issues. Questions answered. Outlined signs and symptoms indicating need for more acute intervention. Patient verbalized understanding. After Visit Summary given.  SUBJECTIVE: History from: patient.  Grace Lyons is a 62 y.o. female who presents with complaint of productive cough; 5-6 days; is wheezing. Feels she needs prednisone. Using alb inhalers frequently.  Social History   Tobacco Use  Smoking Status Former   Current packs/day: 0.00   Types: Cigarettes   Quit date: 01/12/1996   Years since quitting: 27.4  Smokeless Tobacco Never    OBJECTIVE:  Vitals:   06/03/23 1334  BP: 130/82  Pulse: 74  Resp: 16  Temp: 98.1 F (36.7 C)  TempSrc: Oral  SpO2: 94%      General appearance: alert; NAD HEENT: [place]; AT; with mild nasal congestion Neck: supple without LAD Cv: RRR without murmer Lungs: unlabored respirations, moderate bilateral expiratory wheezing; cough: dry; no significant respiratory distress Skin: warm and dry Psychological: alert and cooperative; normal mood and affect  Imaging: No results found.  Allergies  Allergen Reactions   Gabapentin    Doxycycline Rash   Lidocaine Rash    Past Medical History:  Diagnosis Date   Arthritis    Cervical polyp    Degenerative arthritis of knee, bilateral 12/09/2015   Injections 12/09/2015 Started Orthovisc left knee 02/03/2016    Fibroid    H/O left knee surgery 09/2017   Knee pain, left 12/29/2010   Menometrorrhagia    Vitamin D deficiency    Family History  Problem Relation Age of Onset   Diabetes Mother    Hypertension Mother    Heart disease Mother    Diabetes Father    Hypertension Father    Prostate cancer Father    Osteoarthritis Brother    Mental illness Son    Social History   Socioeconomic History   Marital status: Single    Spouse name: Not on file   Number of children: Not on file   Years of education: Not on file   Highest education level: Bachelor's degree (e.g., BA, AB, BS)  Occupational History    Employer: [employer]  Tobacco Use  Smoking status: Former    Current packs/day: 0.00    Types: Cigarettes    Quit date: 01/12/1996    Years since quitting: 27.4   Smokeless tobacco: Never  Vaping Use   Vaping status: Never Used  Substance and Sexual Activity   Alcohol use: Yes   Drug use: No   Sexual activity: Not Currently    Birth control/protection: Surgical    Comment: BTL  Other Topics Concern   Not on file  Social History Narrative   Not on file   Social Determinants of Health   Financial Resource Strain: Not on file  Food Insecurity: Not on file  Transportation Needs: No Transportation Needs (08/29/2019)   PRAPARE -  Administrator, Civil Service (Medical): No    Lack of Transportation (Non-Medical): No  Physical Activity: Not on file  Stress: Not on file  Social Connections: Not on file  Intimate Partner Violence: Not on file             Mardella Layman, MD 06/05/23 1110

## 2023-06-07 ENCOUNTER — Ambulatory Visit (INDEPENDENT_AMBULATORY_CARE_PROVIDER_SITE_OTHER): Payer: 59 | Admitting: Pharmacist

## 2023-06-07 ENCOUNTER — Encounter: Payer: Self-pay | Admitting: Pharmacist

## 2023-06-07 VITALS — BP 128/68 | HR 73 | Ht 62.0 in | Wt 168.2 lb

## 2023-06-07 DIAGNOSIS — R0602 Shortness of breath: Secondary | ICD-10-CM | POA: Insufficient documentation

## 2023-06-07 MED ORDER — BUDESONIDE-FORMOTEROL FUMARATE 160-4.5 MCG/ACT IN AERO: 2.0000 | INHALATION_SPRAY | Freq: Two times a day (BID) | RESPIRATORY_TRACT | 6 refills | Status: DC

## 2023-06-07 NOTE — Progress Notes (Signed)
S:     Chief Complaint  Patient presents with   Medication Management    SOB/Cough/Wheezing   62 y.o. female who presents for respiratory evaluation, education, and management. Patient arrives in fair spirits and presents without any assistance. She expresses frustration with multiple exacerbations over the past month.   Patient was referred and last seen by Primary Care Provider, Dr. Jena Gauss on 05/17/2023.  Pt presented to ED on 06/03/2023 for moderate persistent asthma exacerbation. Other ED visits for breathing complications on 05/05/2023 and 05/12/2023.   PMH is significant for Asthma as a young child reporting use of inhaler for about 1 year in 4th grade.   At last ED visit, started Duoneb (ipratropium-albuterol) 0.5-2.5 (3) mg/71mL nebulizer solution 3 mL, prednisone 40mg  daily X 5 days, Hycodan (hydrocodone bit-homatropine) 5-1.5 mg/16mL syrup 5 mLs every 6 hours for cough.   Pt is currently restoring a house that has mold and mildew. She reports wearing a mask but not a respirator for the project. Pt reporting wheezing and phlegm that may be clear or occasionally have green specks. Pt reports no lightheadedness/dizziness, but constant productive cough and wheezing that is not getting better. Pt has had 4 prednisone bursts in 2024, and reports the prednisone helps her feel better. However, the Duoneb only helps for a few hours. Discussed likely possibility that mold exposure during restoration is worsening lung condition.   O: Review of Systems  Respiratory:  Positive for cough, sputum production, shortness of breath and wheezing.     Physical Exam Vitals reviewed.  Constitutional:      Appearance: Normal appearance.  Pulmonary:     Breath sounds: Wheezing present.  Neurological:     Mental Status: She is alert.  Psychiatric:        Mood and Affect: Mood normal.        Behavior: Behavior normal.        Thought Content: Thought content normal.        Judgment: Judgment  normal.    Vitals:   06/07/23 1055  BP: 128/68  Pulse: 73  SpO2: 100%    A/P: Patient has been experiencing SOB/coughing/wheezing/phlegm production with first complaint on 04/25/2023, and 3 ED visits (9/20, 9/27, 10/19), complaints coincide with mold restoration project. Taking Duoneb (ipratropium-albuterol) 0.5-2.5 (3) mg/18mL nebulizer solution 3 mL, prednisone 40mg  daily, Hycodan (hydrocodone bit-homatropine) 5-1.5 mg/61mL syrup 5 mLs every 6 hours for cough, albuterol inhaler 108 mcg/act 2 puffs as needed. Medication adherence good. Pt presented for PFTs, but due to breathing complications, postponed PFTs. Dr. Lum Babe listened to Pt's lungs and approved plan.  - Started Symbicort (budesonide-formoterol) 160-4.5 mcg/act 2 puffs 2 times daily Patient educated on purpose, proper use and potential adverse effects of thrush.   Following instruction patient verbalized understanding of treatment plan.  Patient was able to demonstrate appropriate technique while administering first dose in office with use of trainer.  - Continued Duoneb (ipratropium-albuterol) 0.5-2.5 mg/mL 3 mLs twice daily until symptoms improved - Continued albuterol inhaler 2 puffs as needed - Finish last day of 5 for prednisone course of 40mg  daily dose today.   - Recommended use of respirator (not only mask) for the duration of the construction project. -Educated patient on purpose, proper use, potential adverse effects including risk of esophageal candidiasis and need to rinse mouth after each use.   Total time in face to face counseling 37 minutes.    Follow-up:  Pharmacist for PFTs in November when Pt is feeling better PCP  clinic visit 06/12/2023 with Dr. Threasa Beards Patient seen with Shona Simpson, PharmD Candidate.

## 2023-06-07 NOTE — Assessment & Plan Note (Signed)
Patient has been experiencing SOB/coughing/wheezing/phlegm production with first complaint on 04/25/2023, and 3 ED visits (9/20, 9/27, 10/19), complaints coincide with mold restoration project. Taking Duoneb (ipratropium-albuterol) 0.5-2.5 (3) mg/3mL nebulizer solution 3 mL, prednisone 40mg  daily, Hycodan (hydrocodone bit-homatropine) 5-1.5 mg/51mL syrup 5 mLs every 6 hours for cough, albuterol inhaler 108 mcg/act 2 puffs as needed. Medication adherence good. Pt presented for PFTs, but due to breathing complications, postponed PFTs. Dr. Lum Babe listened to Pt's lungs and approved plan.  - Started Symbicort (budesonide-formoterol) 160-4.5 mcg/act 2 puffs 2 times daily  - Continued Duoneb (ipratropium-albuterol) 0.5-2.5 mg/mL 3 mLs twice daily until symptoms improved - Continued albuterol inhaler 2 puffs as needed - Finish last day of 5 for prednisone course of 40mg  daily dose today.

## 2023-06-07 NOTE — Patient Instructions (Addendum)
It was nice to see you today!  Recommend getting a respirator for continued construction on house  Medication Changes: START Symbicort (budesonide-formoterol) 160-4.5 mcg/actuation 2 puffs twice daily  CONTINUE Duoneb (ipratropium-albuterol) nebulizer solution 3 mLs twice daily until symptoms improve  CONTINUE albuterol inhaler 2 puffs as needed   FINISH prednisone 20 mg 2 tablets daily  Continue all other medication the same.   Return for lung function tests in November, when feeling better, please call [phone] to schedule

## 2023-06-08 NOTE — Progress Notes (Signed)
Reviewed and agree with Dr Koval's plan.   

## 2023-06-11 NOTE — Progress Notes (Signed)
    SUBJECTIVE:   CHIEF COMPLAINT / HPI:   Asthma  SOB  Cough Patient with episodes of SOB and cough over the past couple months, concerning for asthma exacerbation with known mold/mildew exposure from home renovations. Patient saw Dr Raymondo Band and his pharmacy team last week for medication management, during which she was started on Symbicort. Overall, patient has been feeling better this week, with improved cough. Some production of clear mucus. Deep inspirations may trigger cough, and she feels like she is still not getting an entire breath in. Laying down makes breathing worse, sometimes worsened by activity. No recent dizziness, headache, VC. No N/V/D, fevers, or chills. She has not had to use her albuterol, duonebs, or hycodan recently. She finished her steroid pack last week (predisone 40mg  x 5 days). She has been wearing her mask at home and plans to use Symbicort until one week after her renovations are complete, approximately around Nov 6th.   PERTINENT  PMH / PSH: Childhood asthma, prediabetes  OBJECTIVE:   BP 120/76   Pulse 75   Wt 166 lb (75.3 kg)   LMP 08/14/2012   SpO2 96%   BMI 30.36 kg/m   General: Well-appearing. Resting comfortably in room. CV: Normal S1/S2. No extra heart sounds. Warm and well-perfused. Pulm: Breathing comfortably on room air. Some bibasilar crackles. No wheezing. Appropriate air movement throughout. No increased WOB. Abd: Soft, non-tender, non-distended. Skin:  Warm, dry. Psych: Pleasant and appropriate.   ASSESSMENT/PLAN:   Asthma  SOB  Cough Patient with possible asthma exacerbation triggered by mold exposure now with improving symptoms and breathing status.  - Continue Symbicort 160-4.5 2p BID  - Duonebs, albuterol as needed  - Plan for future PFTs when patient is back to baseline   Return to clinic in 2 months or sooner as needed. Patient to follow up with PCP for continued diabetes management.   Ivery Quale, MD [facility]

## 2023-06-12 ENCOUNTER — Encounter: Payer: Self-pay | Admitting: Family Medicine

## 2023-06-12 ENCOUNTER — Ambulatory Visit (INDEPENDENT_AMBULATORY_CARE_PROVIDER_SITE_OTHER): Payer: 59 | Admitting: Family Medicine

## 2023-06-12 VITALS — BP 120/76 | HR 75 | Wt 166.0 lb

## 2023-06-12 DIAGNOSIS — R0602 Shortness of breath: Secondary | ICD-10-CM

## 2023-06-12 NOTE — Patient Instructions (Addendum)
Thank you for visiting clinic today - it is always our pleasure to care for you.  Today we discussed how you've been doing with your breathing. We are so glad to hear that you've been feeling better! Please continue to take your medications as prescribed. Your symptoms should continue to improve over time, which may be gradual. If you are ever feeling worse, please let us know and come see [place].   Please schedule a pharmacy appointment in a couple months for lung function testing when you're back at your baseline.   Please schedule an appointment with your PCP in a couple months for continued diabetes/prediabetes management.  Reach out any time with any questions or concerns you may have - we are here for you!  Ivery Quale, MD [facility] [phone]

## 2023-06-14 ENCOUNTER — Telehealth: Payer: Self-pay | Admitting: Pharmacist

## 2023-06-14 NOTE — Telephone Encounter (Signed)
Patient contacted for follow-up of recent inhaler initiation.   Since last contact patient reports she has started the Symbicort inhaler and is doing much better with symptoms including cough and congestion.  Patient denies any significant medication related side effects. States she has been rinsing her mouth after use.   Medication Plan: -Continue Symbicort (formoterol/budesonide)  2 puffs twice daily.     Total time with patient call and documentation of interaction: 7 minutes.

## 2023-07-06 ENCOUNTER — Ambulatory Visit: Payer: 59 | Admitting: Pharmacist

## 2023-07-18 ENCOUNTER — Encounter: Payer: Self-pay | Admitting: Pharmacist

## 2023-07-18 ENCOUNTER — Ambulatory Visit (INDEPENDENT_AMBULATORY_CARE_PROVIDER_SITE_OTHER): Payer: 59 | Admitting: Pharmacist

## 2023-07-18 VITALS — Ht 62.0 in | Wt 170.8 lb

## 2023-07-18 DIAGNOSIS — R0602 Shortness of breath: Secondary | ICD-10-CM

## 2023-07-18 NOTE — Progress Notes (Signed)
S:     Chief Complaint  Patient presents with   Medication Management    Spirometry PFT   62 y.o. female who presents for respiratory evaluation, education, and management. Patient arrives in good spirits and presents without any assistance. Patient is accompanied by her partner.   Patient was referred and last seen by Primary Care Provider, Dr. Jena Gauss, on 05/17/2023.   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 .  At last pharmacist visit on 06/07/2023, symbicort (budesonide/formoterol) was initiated for assistance in management with respiratory symptoms.  She was also advised of need to wear improved respiratory protection (respirator) with similar future work.   Patient reports breathing has been much improved since last visit . Patient states breathing is 9/10 for doing well upon arrival today.  Patient reports use of symbicort has recently been PRN symptoms and notes that her symptoms are improved following use.  Patient reports last dose of asthma medications was 2-3 days ago.  Denies use today and yesterday of any inhalation therapy.  Current asthma medications: Symbicort (busesonide/formoterol) Rescue inhaler use frequency: Minimal use  Level of asthma sx control- in the last 4 weeks: Question Scoring Patient Score  Daytime sx > 2x/week Yes (1)   No (0) 0  Any nighttime waking due to asthma Yes (1)   No (0) 1  Reliever needed >2x/week Yes (1)   No (0) 1  Any activity limitation due to asthma Yes (1)   No (0) 0   Total Score 2  Well controlled - 0, Partly controlled - 1-2, Uncontrolled 3-4  Patient reports running track (440) in high school and playing softball in both high school and college.  This athletic history may be important to interpret her spirometry report.   O: Review of Systems  Respiratory:  Positive for cough (nocturnal) and shortness of breath (states she hears a flapping sound in throat when  sleeping).   All other systems reviewed and are negative.   Physical Exam Vitals reviewed.  Constitutional:      Appearance: Normal appearance.  Pulmonary:     Effort: Pulmonary effort is normal.  Neurological:     Mental Status: She is alert.  Psychiatric:        Mood and Affect: Mood normal.        Behavior: Behavior normal.        Thought Content: Thought content normal.   See "scanned report" or Documentation Flowsheet (discrete results - PFTs) for  Spirometry results. Patient provided good effort while attempting spirometry.  Lung Age = 55 - current patient chronological age  A/P: Patient has been experiencing cough and a variety of respiratory symptoms which have improved since finishing her home project (removing black mold) and starting symbicort.  She is now using this medication only PRN currently and continues to have intermittent symptoms.   Spirometry evaluation with pre-bronchodilator reveals normal lung function.   -continue    symbicort (budesonide/formoterol) 2 puffs each evening for the next month.  -Educated patient on purpose, proper use, potential adverse effects including risk of esophageal candidiasis and need to rinse mouth after each use.  - Reviewed results of pulmonary function tests.  - Encouraged cessation of all smoking (patient denies smoking tobacco) Patient verbalized understanding of treatment plan.  Written patient instructions provided.  -Asked to return for additional PCP work-up including ENT vs Pulm work-up if symptoms continue despite use of symbicort.  Total time in  face to face counseling 28 minutes.    Follow-up:  Pharmacist - PRN - none planned PCP clinic visit 1 month or sooner if symptoms persist.

## 2023-07-18 NOTE — Assessment & Plan Note (Signed)
Patient has been experiencing cough and a variety of respiratory symptoms which have improved since finishing her home project (removing black mold) and starting symbicort.  She is now using this medication only PRN currently and continues to have intermittent symptoms.   Spirometry evaluation with pre-bronchodilator reveals normal lung function.   -continue  symbicort (budesonide/formoterol) 2 puffs each evening for the next month.  -Educated patient on purpose, proper use, potential adverse effects including risk of esophageal candidiasis and need to rinse mouth after each use.  - Reviewed results of pulmonary function tests.  - Encouraged cessation of all smoking (patient denies smoking tobacco)

## 2023-07-18 NOTE — Patient Instructions (Addendum)
Nice to see you again today!  Please restart your symbicort inhaler 2 inhalation prior to bedtime  If symptoms resolve please continue for at least 1 month.   If symptoms persist, please return for additional work-up with Dr. Jena Gauss.

## 2023-07-19 NOTE — Progress Notes (Signed)
Reviewed and agree with Dr Koval's plan.   

## 2023-08-16 NOTE — Progress Notes (Addendum)
    SUBJECTIVE:   CHIEF COMPLAINT / HPI: cough and mucus  Cough Started 2 weeks ago and has improved since, but still coughing up phlegm. Was traveling to [place]. She returned home and tried Mucinex  to help her cough. She feels that she has some congestion as well in her ears. She would like a referral to ENT at this time to discuss this along with some symptoms of feeling like her throat is closing off when she is laying down flat at night and she hears a noise that comes after the flap closes.  PERTINENT  PMH / PSH: Vit D deficiency  OBJECTIVE:   BP 130/78   Pulse 73   Ht 5' 2 (1.575 m)   Wt 168 lb 12.8 oz (76.6 kg)   LMP 08/14/2012   SpO2 100%   BMI 30.87 kg/m   General: Awake and Alert in NAD HEENT: NCAT. Sclera anicteric. No rhinorrhea Cardiovascular: RRR. No M/R/G Respiratory: CTAB, normal WOB on RA. No wheezing, crackles, rhonchi, or diminished breath sounds. Abdomen: Soft, non-tender, non-distended. Bowel sounds normoactive Extremities: Able to move all extremities equally. No BLE edema, no deformities or significant joint findings. Skin: Warm and dry. No abrasions or rashes noted. Neuro: A&Ox3. No focal neurological deficits.  ASSESSMENT/PLAN:   Assessment & Plan Acute cough Cough for 2 weeks which has seemed to improve with OTC medications. Discussed possible etiology being a viral illness and that coughs take some time to resolve.  - Advised hydration and lozenges as needed - Follow up if symptoms persist or worsen Throat discomfort Describes throat closing off at night occasionally associated to a noise. Differential includes laryngomalacia vs GERD vs OSA vs PND vs allergic rhinitis. - Referral to ENT per patient request  Kathrine Melena, DO [facility]

## 2023-08-17 ENCOUNTER — Encounter: Payer: Self-pay | Admitting: Family Medicine

## 2023-08-17 ENCOUNTER — Ambulatory Visit: Payer: No Typology Code available for payment source | Admitting: Family Medicine

## 2023-08-17 VITALS — BP 130/78 | HR 73 | Ht 62.0 in | Wt 168.8 lb

## 2023-08-17 DIAGNOSIS — R051 Acute cough: Secondary | ICD-10-CM | POA: Diagnosis not present

## 2023-08-17 DIAGNOSIS — R070 Pain in throat: Secondary | ICD-10-CM | POA: Diagnosis not present

## 2023-08-17 NOTE — Patient Instructions (Signed)
 It was great to see you today! Thank you for choosing [facility] for your primary care. Grace Lyons was seen for cough.  Today we addressed: Cough - please stay hydrated and try lozenges or tea with honey to help your symptoms Throat discomfort - will refer you to ENT for your symptoms at night  Thank you for allowing me to participate in your care, Kathrine Melena, DO 08/17/2023, 9:46 AM PGY-1, [facility]

## 2023-08-30 ENCOUNTER — Encounter (HOSPITAL_COMMUNITY): Payer: Self-pay | Admitting: *Deleted

## 2023-08-30 ENCOUNTER — Ambulatory Visit (INDEPENDENT_AMBULATORY_CARE_PROVIDER_SITE_OTHER): Payer: No Typology Code available for payment source

## 2023-08-30 ENCOUNTER — Other Ambulatory Visit: Payer: Self-pay

## 2023-08-30 ENCOUNTER — Ambulatory Visit (HOSPITAL_COMMUNITY)
Admission: EM | Admit: 2023-08-30 | Discharge: 2023-08-30 | Disposition: A | Discharge status: Home / Self Care | Payer: No Typology Code available for payment source | Attending: Family Medicine | Admitting: Family Medicine

## 2023-08-30 DIAGNOSIS — R053 Chronic cough: Secondary | ICD-10-CM

## 2023-08-30 DIAGNOSIS — J4541 Moderate persistent asthma with (acute) exacerbation: Secondary | ICD-10-CM | POA: Diagnosis not present

## 2023-08-30 DIAGNOSIS — R0602 Shortness of breath: Secondary | ICD-10-CM | POA: Diagnosis not present

## 2023-08-30 MED ORDER — AZITHROMYCIN 250 MG PO TABS: 250.0000 mg | ORAL_TABLET | Freq: Every day | ORAL | 0 refills | Status: DC

## 2023-08-30 MED ORDER — PREDNISONE 50 MG PO TABS
ORAL_TABLET | ORAL | 0 refills | Status: DC
Start: 2023-08-30 — End: 2023-11-20

## 2023-08-30 MED ORDER — IPRATROPIUM-ALBUTEROL 0.5-2.5 (3) MG/3ML IN SOLN
RESPIRATORY_TRACT | Status: AC
  Filled 2023-08-30: qty 3

## 2023-08-30 MED ORDER — IPRATROPIUM-ALBUTEROL 0.5-2.5 (3) MG/3ML IN SOLN
3.0000 mL | Freq: Once | RESPIRATORY_TRACT | Status: AC
  Administered 2023-08-30: 3 mL via RESPIRATORY_TRACT

## 2023-08-30 NOTE — ED Provider Notes (Signed)
 [facility]   [id number] 08/30/23 Arrival Time: 1214  ASSESSMENT & PLAN:  1. SOB (shortness of breath)   2. Shortness of breath   3. Moderate persistent asthma with acute exacerbation   4. Persistent cough for 3 weeks or longer    I have personally viewed and independently interpreted the imaging studies ordered this visit. CXR: no acute changes noted.  Discussed typical duration of likely viral illness. OTC symptom care as needed.  Given duration of symptoms without improvement begin: Discharge Medication List as of 08/30/2023  4:16 PM     START taking these medications   Details  azithromycin  (ZITHROMAX ) 250 MG tablet Take 1 tablet (250 mg total) by mouth daily. Take first 2 tablets together, then 1 every day until finished., Starting Wed 08/30/2023, Normal    predniSONE  (DELTASONE ) 50 MG tablet Take one tablet by mouth every morning for 5 days., Normal       Placed upon pt request:  Ambulatory referral to Allergy     Follow-up Information     Schedule an appointment as soon as possible for a visit  with Ernestina Headland, MD.   Specialty: Family Medicine Why: For follow up. Contact information: [address] [place] [address] [phone]                 Reviewed expectations re: course of current medical issues. Questions answered. Outlined signs and symptoms indicating need for more acute intervention. Understanding verbalized. After Visit Summary given.   SUBJECTIVE: History from: Patient. Grace Lyons is a 63 y.o. female. Pt reports for one week she has had chills, wheezing , SOB ,Body aches. Pt thinks she has the flu. Normal PO intake without n/v/d. Seems very frustrated regarding these symptoms that have been present on/off over past two months.  OBJECTIVE:  Vitals:   08/30/23 1318  BP: (!) 164/85  Pulse: 80  Resp: 20  Temp: 98.7 F (37.1 C)  SpO2: 95%    General appearance: alert; no distress Eyes: PERRLA; EOMI;  conjunctiva normal HENT: [place]; AT; with nasal congestion Neck: supple  Lungs: speaks full sentences without difficulty; unlabored Extremities: no edema Skin: warm and dry Neurologic: normal gait Psychological: alert and cooperative; normal mood and affect  Labs: Results for orders placed or performed in visit on 05/17/23  Microalbumin / creatinine urine ratio   Collection Time: 05/17/23 12:58 PM  Result Value Ref Range   Creatinine, Urine 126.8 Not Estab. mg/dL   Microalbumin, Urine 8.7 Not Estab. ug/mL   Microalb/Creat Ratio 7 0 - 29 mg/g creat   Labs Reviewed - No data to display  Imaging: DG Chest 2 View Result Date: 08/30/2023 CLINICAL DATA:  Shortness of breath EXAM: CHEST - 2 VIEW COMPARISON:  Chest x-ray 05/05/2023 skip FINDINGS: The heart size and mediastinal contours are within normal limits. Both lungs are clear. The visualized skeletal structures are unremarkable. IMPRESSION: No active cardiopulmonary disease. Electronically Signed   By: Tyron Gallon M.D.   On: 08/30/2023 15:51    Allergies  Allergen Reactions   Gabapentin Itching    Denies airway involvement   Doxycycline  Rash   Lidocaine  Rash    Past Medical History:  Diagnosis Date   Arthritis    Cervical polyp    Degenerative arthritis of knee, bilateral 12/09/2015   Injections 12/09/2015 Started Orthovisc left knee 02/03/2016    Fibroid    H/O left knee surgery 09/2017   Knee pain, left 12/29/2010   Menometrorrhagia  Vitamin D  deficiency    Social History   Socioeconomic History   Marital status: Single    Spouse name: Not on file   Number of children: Not on file   Years of education: Not on file   Highest education level: Bachelor's degree (e.g., BA, AB, BS)  Occupational History    Employer: [employer]  Tobacco Use   Smoking status: Former    Current packs/day: 0.00    Types: Cigarettes    Quit date: 01/12/1996    Years since quitting: 27.6    Passive exposure: Past    Smokeless tobacco: Never  Vaping Use   Vaping status: Never Used  Substance and Sexual Activity   Alcohol use: Yes   Drug use: No   Sexual activity: Not Currently    Birth control/protection: Surgical    Comment: BTL  Other Topics Concern   Not on file  Social History Narrative   Not on file   Social Drivers of Health   Financial Resource Strain: Not on file  Food Insecurity: Not on file  Transportation Needs: No Transportation Needs (08/29/2019)   PRAPARE - Administrator, Civil Service (Medical): No    Lack of Transportation (Non-Medical): No  Physical Activity: Not on file  Stress: Not on file  Social Connections: Not on file  Intimate Partner Violence: Not on file   Family History  Problem Relation Age of Onset   Diabetes Mother    Hypertension Mother    Heart disease Mother    Diabetes Father    Hypertension Father    Prostate cancer Father    Osteoarthritis Brother    Mental illness Son    Past Surgical History:  Procedure Laterality Date   ENDOMETRIAL BIOPSY  12/17/2008   HYSTEROSCOPY WITH D & C  08/31/2012   Procedure: DILATATION AND CURETTAGE /HYSTEROSCOPY;  Surgeon: Madelene Schanz, MD;  Location: [facility];  Service: Gynecology;  Laterality: N/A;  with resectoscope   KNEE SURGERY     TUBAL LIGATION  1995     Afton Albright, MD 08/30/23 1807

## 2023-08-30 NOTE — ED Triage Notes (Signed)
 Pt reports for one week she has had chills ,SHOB ,Body aches. Pt thinks she has the flu.

## 2023-09-08 NOTE — Progress Notes (Unsigned)
NEW PATIENT Date of Service/Encounter:  09/11/23 Referring provider: Mardella Layman, MD Primary care provider: Alfredo Martinez, MD  Subjective:  Grace Lyons is a 63 y.o. female with a PMHx of prediabetes, vitamin D deficiency, alcohol use presenting today for evaluation of asthma, chronic rhinitis, possible reflux. History obtained from: chart review and patient.   Discussed the use of AI scribe software for clinical note transcription with the patient, who gave verbal consent to proceed.  History of Present Illness   The patient, with a history of childhood asthma, presents with recurrent respiratory issues that began in July. They initially dismissed the symptoms as seasonal, but as the condition worsened, they sought medical attention. The patient describes difficulty breathing and the production of clear white phlegm, particularly at night, which disrupts their sleep. They have had multiple episodes of chest pain.  The patient has been treated with breathing treatments, albuterol, and prednisone, which temporarily alleviate the symptoms. However, the symptoms recur approximately two weeks after each treatment. The patient has also been prescribed Symbicort, which they use twice daily during symptomatic periods.  In addition to the respiratory symptoms, the patient has experienced changes in their diet, specifically an intolerance to peanuts and spicy foods, which they previously enjoyed. They deny any symptoms of acid reflux unless eating these foods which they now avoid. The patient has a history of smoking marijuana, which they discontinued in October due to the respiratory symptoms. Symptoms have persisted.  The patient has been prescribed Flonase and a trial of omeprazole by an otolaryngologist for possible reflux. They have also been on multiple courses of prednisone since the onset of the respiratory symptoms. Despite these treatments, the patient's symptoms persist, leading to  frequent emergency room visits.  The patient has a history of sensitive skin and reacts to certain chemicals, particularly bleach. They deny any known allergies besides doxycycline, gabapentin and lidocaine which cause a rash or history of eczema. The patient's childhood asthma has been dormant for approximately fifty years.      Chart Review:  Reviewed UC notes from referral 08/30/23: seen for asthma at [facility] prescribed prednisone and z-pack, normal CXR, referred to allergy. ENT 09/05/23: LPR with laryngospasm-2 month trial omeprazole 40 mg, ETD: flonase 2 SEN daily PCP 08/17/23: cough x 2 weeks, plan: hydration and lozenges, throat discomfort: referral to ENT -07/18/23: normal lung function reported PCP visit 06/12/23: continued on symbicort 160 BID (started 06/07/23), duonebs and albuterol PRN ED visit 06/03/23-asthma flare requiring duonebs, prednisone ED 05/12/23: asthma flare: duonebs, prednisone, azithromycin ED 05/05/23: "bronchitis with wheezing", tx: augmentin, prednisone, tussinex ED 10/16/22: viral URI with cough: prednisone, albuterol, tessalon, promethazine 08/30/23: CXR normal 04/25/23: AEC 200  Other allergy screening: Food allergy: no Hymenoptera allergy: no History of recurrent infections suggestive of immunodeficency: no Vaccinations are up to date.   Past Medical History: Past Medical History:  Diagnosis Date   Arthritis    Asthma    Cervical polyp    Degenerative arthritis of knee, bilateral 12/09/2015   Injections 12/09/2015 Started Orthovisc left knee 02/03/2016    Fibroid    H/O left knee surgery 09/2017   Knee pain, left 12/29/2010   Menometrorrhagia    Vitamin D deficiency    Medication List:  Current Outpatient Medications  Medication Sig Dispense Refill   budesonide-formoterol (SYMBICORT) 160-4.5 MCG/ACT inhaler Inhale 2 puffs into the lungs 2 (two) times daily. 1 each 6   Cholecalciferol (VITAMIN D3) 1000 UNITS CAPS Take 1,000 Units by mouth daily.  fluticasone (FLONASE) 50 MCG/ACT nasal spray Place 2 sprays into both nostrils daily.     hydrocortisone 2.5 % ointment Apply topically 2 (two) times daily. As needed for mild eczema.  Do not use for more than 1-2 weeks at a time. 30 g 3   ipratropium-albuterol (DUONEB) 0.5-2.5 (3) MG/3ML SOLN Take 3 mLs by nebulization every 6 (six) hours as needed. 360 mL 0   mineral oil-hydrophilic petrolatum (AQUAPHOR) ointment Apply topically as needed for dry skin. 420 g 0   nystatin ointment (MYCOSTATIN) Apply 1 Application topically 2 (two) times daily.     albuterol (VENTOLIN HFA) 108 (90 Base) MCG/ACT inhaler Inhale 2 puffs into the lungs every 4 (four) hours as needed for wheezing or shortness of breath. (Patient not taking: Reported on 09/11/2023) 8 g 0   azithromycin (ZITHROMAX) 250 MG tablet Take 1 tablet (250 mg total) by mouth daily. Take first 2 tablets together, then 1 every day until finished. (Patient not taking: Reported on 09/11/2023) 6 tablet 0   cetirizine (ZYRTEC) 5 MG tablet Take 5 mg by mouth daily. (Patient not taking: Reported on 09/11/2023)     predniSONE (DELTASONE) 50 MG tablet Take one tablet by mouth every morning for 5 days. (Patient not taking: Reported on 09/11/2023) 5 tablet 0   No current facility-administered medications for this visit.   Known Allergies:  Allergies  Allergen Reactions   Gabapentin Itching    Denies airway involvement   Doxycycline Rash   Lidocaine Rash   Past Surgical History: Past Surgical History:  Procedure Laterality Date   ENDOMETRIAL BIOPSY  12/17/2008   HYSTEROSCOPY WITH D & C  08/31/2012   Procedure: DILATATION AND CURETTAGE /HYSTEROSCOPY;  Surgeon: Purcell Nails, MD;  Location: [facility];  Service: Gynecology;  Laterality: N/A;  with resectoscope   KNEE SURGERY     TONSILLECTOMY     TUBAL LIGATION  1995   Family History: Family History  Problem Relation Age of Onset   Allergic rhinitis Mother    Diabetes Mother    Hypertension  Mother    Heart disease Mother    Diabetes Father    Hypertension Father    Prostate cancer Father    Osteoarthritis Brother    Mental illness Son    Social History: [place] lives in a house built 1 year ago, water damage remediated in October, carpet in bedrooms, Architectural technologist, central AC, 3 dogs, no roaches, using DM covers on beds and pillows, no smoke exposure, + HEPA filter in the home, [place], not exposed to fumes, chemicals or dusts.   ROS:  All other systems negative except as noted per HPI.  Objective:  Blood pressure 104/74, pulse 80, temperature 97.7 F (36.5 C), temperature source Temporal, resp. rate 14, height 5' 1.22" (1.555 m), weight 167 lb 8 oz (76 kg), last menstrual period 08/14/2012, SpO2 95%. Body mass index is 31.42 kg/m. Physical Exam:  General Appearance:  Alert, cooperative, no distress, appears stated age  Head:  Normocephalic, without obvious abnormality, atraumatic  Eyes:  Conjunctiva clear, EOM's intact  Ears EACs normal bilaterally and normal TMs bilaterally  Nose: Nares normal,  deviated septum towards right, hypertrophic turbinates, normal mucosa, and no visible anterior polyps  Throat: Lips, tongue normal; teeth and gums normal, normal posterior oropharynx and + cobblestoning  Neck: Supple, symmetrical  Lungs:   clear to auscultation bilaterally, Respirations unlabored, intermittent dry coughing  Heart:  regular rate and rhythm and no murmur, Appears  well perfused  Extremities: No edema  Skin: Skin color, texture, turgor normal and no rashes or lesions on visualized portions of skin  Neurologic: No gross deficits   Diagnostics: Spirometry:  Tracings reviewed. Her effort: Good reproducible efforts. FVC: 2.52L  FEV1: 2.21L, 117% predicted  Interpretation: Spirometry consistent with normal pattern.  Please see scanned spirometry results for details.  Labs:  Lab Orders  No laboratory test(s) ordered today      Assessment and Plan  Assessment and Plan    Asthma Recurrent episodes of dyspnea, chest pain, and cough with clear sputum production, particularly at night. Symptoms respond to prednisone and albuterol, but recur after cessation of prednisone. History of childhood asthma. Currently on Symbicort (2 puffs BID) and albuterol as needed, but not consistently using Symbicort. -Switch from Symbicort to Breztri (2 puffs BID) for better control of inflammation and mucus production. Use with spacer and rinse mouth after use. -Continue albuterol 2 puffs or 1 vial via nebulizer every 4 hours as needed. -Order labs for eosinophil count and allergy testing with total IgE level to assess for possible biologic therapy (injectable asthma medications). - asthma goals: no hospitalizations, 1 or less systemic steroids, no ED or UC visits, less than twice weekly daytime symptoms, less than twice monthly nighttime symptoms Avoid smoke exposure Stay up-to-date with your annual flu vaccines, COVID vaccines and pneumonia vaccines when indicated. Your lung testing today looked okay (recent prednisone use)  Chronic Rhinitis:  Signs of drainage on exam. -continue Flonase 1 spray in each nostril daily or twice daily.  Possible Gastroesophageal Reflux Disease (GERD) Reports some symptoms suggestive of acid reflux, currently on a trial of omeprazole. -Continue omeprazole trial as prescribed by ENT specialist.  General Health Maintenance -Ensure vaccinations are up-to-date. -Advise avoidance of all forms of smoking, including marijuana.      Follow up : 4-6 weeks, sooner if needed.  It was a pleasure meeting you in clinic today! Thank you for allowing me to participate in your care.  Tonny Bollman, MD [facility]     This note in its entirety was forwarded to the Provider who requested this consultation.  Other: samples provided of: Breztri  Thank you for your kind referral. I  appreciate the opportunity to take part in [facility] care. Please do not hesitate to contact me with questions.  Sincerely,  Tonny Bollman, MD [facility]

## 2023-09-11 ENCOUNTER — Ambulatory Visit (INDEPENDENT_AMBULATORY_CARE_PROVIDER_SITE_OTHER): Payer: No Typology Code available for payment source | Admitting: Internal Medicine

## 2023-09-11 ENCOUNTER — Other Ambulatory Visit: Payer: Self-pay

## 2023-09-11 ENCOUNTER — Encounter: Payer: Self-pay | Admitting: Internal Medicine

## 2023-09-11 VITALS — BP 104/74 | HR 80 | Temp 97.7°F | Resp 14 | Ht 61.22 in | Wt 167.5 lb

## 2023-09-11 DIAGNOSIS — J310 Chronic rhinitis: Secondary | ICD-10-CM | POA: Diagnosis not present

## 2023-09-11 DIAGNOSIS — K219 Gastro-esophageal reflux disease without esophagitis: Secondary | ICD-10-CM | POA: Diagnosis not present

## 2023-09-11 DIAGNOSIS — J4550 Severe persistent asthma, uncomplicated: Secondary | ICD-10-CM | POA: Diagnosis not present

## 2023-09-11 MED ORDER — ALBUTEROL SULFATE HFA 108 (90 BASE) MCG/ACT IN AERS: 2.0000 | INHALATION_SPRAY | RESPIRATORY_TRACT | 0 refills | Status: AC | PRN

## 2023-09-11 MED ORDER — FLUTICASONE PROPIONATE 50 MCG/ACT NA SUSP: 2.0000 | Freq: Every day | NASAL | 5 refills | Status: DC

## 2023-09-11 MED ORDER — BREZTRI AEROSPHERE 160-9-4.8 MCG/ACT IN AERO: 2.0000 | INHALATION_SPRAY | Freq: Two times a day (BID) | RESPIRATORY_TRACT | 5 refills | Status: DC

## 2023-09-11 NOTE — Patient Instructions (Addendum)
Asthma Recurrent episodes of dyspnea, chest pain, and cough with clear sputum production, particularly at night. Symptoms respond to prednisone and albuterol, but recur after cessation of prednisone. History of childhood asthma. Currently on Symbicort (2 puffs BID) and albuterol as needed, but not consistently using Symbicort. -Switch from Symbicort to Breztri (2 puffs BID) for better control of inflammation and mucus production. Use with spacer and rinse mouth after use. -Continue albuterol 2 puffs or 1 vial via nebulizer every 4 hours as needed. -Order labs for eosinophil count and allergy testing with total IgE level to assess for possible biologic therapy (injectable asthma medications). - asthma goals: no hospitalizations, 1 or less systemic steroids, no ED or UC visits, less than twice weekly daytime symptoms, less than twice monthly nighttime symptoms Avoid smoke exposure Stay up-to-date with your annual flu vaccines, COVID vaccines and pneumonia vaccines when indicated. Your lung testing today looked okay (recent prednisone use)  Chronic Rhinitis:  Signs of drainage on exam. -continue Flonase 1 spray in each nostril daily or twice daily.  Possible Gastroesophageal Reflux Disease (GERD) Reports some symptoms suggestive of acid reflux, currently on a trial of omeprazole. -Continue omeprazole trial as prescribed by ENT specialist.  General Health Maintenance -Ensure vaccinations are up-to-date. -Advise avoidance of all forms of smoking, including marijuana.      Follow up : 4-6 weeks, sooner if needed.  It was a pleasure meeting you in clinic today! Thank you for allowing me to participate in your care.

## 2023-09-13 LAB — ALLERGENS W/TOTAL IGE AREA 2
Alternaria Alternata IgE: 2.13 kU/L — AB
IgE (Immunoglobulin E), Serum: 106 [IU]/mL (ref 6–495)

## 2023-09-13 LAB — CBC WITH DIFFERENTIAL/PLATELET
Basophils Absolute: 0 10*3/uL (ref 0.0–0.2)
Basos: 0 %
EOS (ABSOLUTE): 0.3 10*3/uL (ref 0.0–0.4)
Eos: 4 %
Hematocrit: 41.5 % (ref 34.0–46.6)
Hemoglobin: 13.9 g/dL (ref 11.1–15.9)
Immature Grans (Abs): 0 10*3/uL (ref 0.0–0.1)
Immature Granulocytes: 1 %
Lymphocytes Absolute: 1.6 10*3/uL (ref 0.7–3.1)
Lymphs: 21 %
MCH: 30.8 pg (ref 26.6–33.0)
MCHC: 33.5 g/dL (ref 31.5–35.7)
MCV: 92 fL (ref 79–97)
Monocytes Absolute: 0.9 10*3/uL (ref 0.1–0.9)
Monocytes: 12 %
Neutrophils Absolute: 4.8 10*3/uL (ref 1.4–7.0)
Neutrophils: 62 %
Platelets: 294 10*3/uL (ref 150–450)
RBC: 4.52 x10E6/uL (ref 3.77–5.28)
RDW: 13 % (ref 11.7–15.4)
WBC: 7.7 10*3/uL (ref 3.4–10.8)

## 2023-09-15 NOTE — Progress Notes (Signed)
Please let Grace Lyons know that based on her results, she would qualify for several injectable asthma biologics.  I would recommend Tezspire for her which is a once per month injection.  Can we send her a pamphlet and have her read about it.  If she would like to start this or has questions, let me know and we can get the process started.

## 2023-09-19 ENCOUNTER — Telehealth: Payer: Self-pay | Admitting: Internal Medicine

## 2023-09-19 NOTE — Telephone Encounter (Signed)
Patient is returning a call from the nurse in reference to allergy injections

## 2023-09-19 NOTE — Telephone Encounter (Signed)
Pt is interested in moving froward with tezspire

## 2023-09-20 NOTE — Telephone Encounter (Signed)
 Can we submit for tezspire please?

## 2023-09-22 NOTE — Telephone Encounter (Signed)
 Melisa Spray please

## 2023-09-22 NOTE — Telephone Encounter (Signed)
 Per Ins patient will have to try and fail preferred drugs Fasenra and/or Xolair. Please advise

## 2023-09-25 ENCOUNTER — Encounter (INDEPENDENT_AMBULATORY_CARE_PROVIDER_SITE_OTHER): Payer: Self-pay | Admitting: Otolaryngology

## 2023-09-25 NOTE — Telephone Encounter (Signed)
 Ins denied patient due to her spiro results over their policy. Has to be below 80% with 12% reversibility. Possibly we can do a followup with updated spiro to get patient on therapy

## 2023-09-25 NOTE — Telephone Encounter (Signed)
 Okay pt was called and message was given, pt verbalized understanding

## 2023-09-25 NOTE — Telephone Encounter (Signed)
 Can we send Grace Lyons a message to stop all inhalers for 48 hours prior to her next follow-up?

## 2023-10-05 NOTE — Telephone Encounter (Signed)
Patient called and explained why she was denied coverage for biologic that she has to have PFT showing obstruction for them to approve. She will reach out when she has her next episode to come in and get PFT done while she is having issues

## 2023-10-09 ENCOUNTER — Ambulatory Visit: Payer: No Typology Code available for payment source | Admitting: Internal Medicine

## 2023-11-07 ENCOUNTER — Encounter: Payer: Self-pay | Admitting: Internal Medicine

## 2023-11-07 ENCOUNTER — Telehealth: Payer: Self-pay | Admitting: *Deleted

## 2023-11-07 NOTE — Telephone Encounter (Signed)
 We did not talk about hives, so she would need to be evaluated for hives. We were trying to get her Xolair due to her reported history of asthma and response to steroids and ICS.   If she would like to schedule a follow-up to discuss hives, that would be her next step.   Xolair does have an indication for hives, so it may be possible to get it approved for that depending on the severity of her symptoms and what medications she has already tried.   Thanks!

## 2023-11-07 NOTE — Telephone Encounter (Signed)
 Patient called frustrated because she was denied Xolair because of her PFT being normal but she is still having outbreaks of hives. She states that they happen at night so it is hard for her to be able to come in during that time because they happen at night. She stated that she did have an outbreak last night. I didn't know what further I could recommend to her. Please route response back to the [facility]. Thank You.

## 2023-11-07 NOTE — Telephone Encounter (Signed)
 Error

## 2023-11-07 NOTE — Telephone Encounter (Signed)
 Called patient- DOB/DPR verified- LMOVM advising of provider notation below.  If/When patient call back - please advise of provider notation below.

## 2023-11-08 NOTE — Telephone Encounter (Signed)
 Photodocumentation is great, but we will need an office visit to discuss if she would like for me to try to approve Xolair for hives.

## 2023-11-20 ENCOUNTER — Encounter: Payer: Self-pay | Admitting: Internal Medicine

## 2023-11-20 ENCOUNTER — Other Ambulatory Visit: Payer: Self-pay

## 2023-11-20 ENCOUNTER — Ambulatory Visit (INDEPENDENT_AMBULATORY_CARE_PROVIDER_SITE_OTHER): Admitting: Internal Medicine

## 2023-11-20 VITALS — BP 132/76 | HR 66 | Temp 98.5°F | Wt 170.7 lb

## 2023-11-20 DIAGNOSIS — L508 Other urticaria: Secondary | ICD-10-CM | POA: Diagnosis not present

## 2023-11-20 DIAGNOSIS — J3089 Other allergic rhinitis: Secondary | ICD-10-CM | POA: Diagnosis not present

## 2023-11-20 DIAGNOSIS — J4550 Severe persistent asthma, uncomplicated: Secondary | ICD-10-CM | POA: Diagnosis not present

## 2023-11-20 MED ORDER — BUDESONIDE-FORMOTEROL FUMARATE 160-4.5 MCG/ACT IN AERO
2.0000 | INHALATION_SPRAY | Freq: Two times a day (BID) | RESPIRATORY_TRACT | 6 refills | Status: AC
Start: 2023-11-20 — End: ?

## 2023-11-20 MED ORDER — FLUTICASONE PROPIONATE 50 MCG/ACT NA SUSP: 2.0000 | Freq: Every day | NASAL | 5 refills | Status: AC

## 2023-11-20 MED ORDER — SPIRIVA RESPIMAT 1.25 MCG/ACT IN AERS: 2.0000 | INHALATION_SPRAY | Freq: Every day | RESPIRATORY_TRACT | Status: DC

## 2023-11-20 MED ORDER — SPIRIVA RESPIMAT 1.25 MCG/ACT IN AERS: 2.0000 | INHALATION_SPRAY | Freq: Every day | RESPIRATORY_TRACT | 5 refills | Status: DC

## 2023-11-20 MED ORDER — CETIRIZINE HCL 10 MG PO TABS: ORAL_TABLET | ORAL | 5 refills | Status: AC

## 2023-11-20 NOTE — Progress Notes (Signed)
 FOLLOW UP Date of Service/Encounter:  11/20/23  Subjective:  Grace Lyons (DOB: 1960-11-27) is a 63 y.o. female who [facility] in re-evaluation of the following: asthma, chronic rhinitis, GERD and new problem of hives History obtained from: chart review and patient.  For Review, LV was on 09/11/23  with Dr.Hyun Reali seen for intial visit for asthma, chronic rhinitis and suspected reflux . See below for summary of history and diagnostics.   ----------------------------------------------------- Pertinent History/Diagnostics:  Asthma Recurrent episodes of dyspnea, chest pain, and cough with clear sputum production, particularly at night. Symptoms respond to prednisone and albuterol, but recur after cessation of prednisone. History of childhood asthma. Currently on Symbicort (2 puffs BID) and albuterol as needed, but not consistently using Symbicort. -current plan: switch from Symbicort 63 yo Breztri, albuterol prn - biologics labs 09/11/23: AEC 300, total IgE 106, environmental labs positive to alternaria Chronic Rhinitis: mixed-seasonal to mold spores and nonallergies Signs of drainage on exam. -current plan: flonase 1 spray in each nostril daily or twice daily. -labsfor environmental allergies 09/11/23 :+ to alternaria only (SPT not offered due to asthma flare) Possible Gastroesophageal Reflux Disease (GERD) Reports some symptoms suggestive of acid reflux, currently on a trial of omeprazole. --------------------------------------------------- Today presents for follow-up.  History of Present Illness   Grace Lyons is a 63 year old female with asthma who presents with worsening hives and asthma symptoms.  She experiences asthma symptoms multiple times a week, primarily at night. The symptoms occur every other day or sometimes three days in a row, with occasional two-day symptom-free periods. She uses her rescue inhaler three to four times a  week, mostly at night. The prescribed inhaler, Markus Daft, is too expensive at $207, leading her to use it only during episodes.  She has recently developed hives, which started a few days after her last visit. The hives occur every other night.  Sometimes coinciding with her asthma symptoms.  She uses clobetasol ointment and over-the-counter hydrocortisone cream for the hives, but finds them ineffective. Hydroxyzine helps with the hives, described as 'welts'. The hives last a couple of hours and resolve on their own. No other symptoms accompany the hives besides her asthma symptoms. The hives are not triggered by specific activities or medications, as they occur regardless of her use of Flonase or Breztri which she initially suspected as culprits. Hot showers exacerbate the hives, so she has switched to cooler showers. Never had hives before. No other changes to lifestyle.  She has completed a trial of omeprazole for acid reflux and is currently asymptomatic. She is not taking any medication for acid reflux at this time.      All medications reviewed by clinical staff and updated in chart. No new pertinent medical or surgical history except as noted in HPI.  ROS: All others negative except as noted per HPI.   Objective:  BP 132/76 (BP Location: Left Arm, Patient Position: Sitting, Cuff Size: Normal)   Pulse 66   Temp 98.5 F (36.9 C) (Temporal)   Wt 170 lb 11.2 oz (77.4 kg)   LMP 08/14/2012   SpO2 97%   BMI 32.02 kg/m  Body mass index is 32.02 kg/m. Physical Exam: General Appearance:  Alert, cooperative, no distress, appears stated age  Head:  Normocephalic, without obvious abnormality, atraumatic  Eyes:  Conjunctiva clear, EOM's intact  Ears EACs normal bilaterally and normal TMs bilaterally  Nose: Nares normal, hypertrophic turbinates, normal mucosa, and no visible  anterior polyps  Throat: Lips, tongue normal; teeth and gums normal, normal posterior oropharynx  Neck: Supple,  symmetrical  Lungs:   clear to auscultation bilaterally, Respirations unlabored, no coughing  Heart:  regular rate and rhythm and no murmur, Appears well perfused  Extremities: No edema  Skin: Skin color, texture, turgor normal and no rashes or lesions on visualized portions of skin  Neurologic: No gross deficits   Labs:  Lab Orders  No laboratory test(s) ordered today    Spirometry:  Tracings reviewed. Her effort: Good reproducible efforts. FVC: 2.19L FEV1: 1.99L, 106% predicted FEV1/FVC ratio: 0.91 Interpretation: Spirometry consistent with normal pattern.  Please see scanned spirometry results for details.  Pictures in chart reviewed and consistent with urticaria.  Assessment/Plan   Asthma-not at goal, not compliant due to medication costs Recurrent episodes of dyspnea, chest pain, and cough with clear sputum production, particularly at night. Symptoms respond to prednisone and albuterol, but recur after cessation of prednisone. History of childhood asthma. Currently on Symbicort (2 puffs BID) and albuterol as needed, but not consistently using Symbicort. Interin HX: still having symptoms multiple times per week, around 3-4 times per week, almost always nightly, not compliant due to medication costs -Start Symbicort 160 mcg (2 puffs BID) for better control of inflammation and mucus production. Use with spacer and rinse mouth after use. -Add Spiriva 2 puffs daily. Do not use with spacer. -Continue albuterol 2 puffs or 1 vial via nebulizer every 4 hours as needed. - asthma goals: no hospitalizations, 1 or less systemic steroids, no ED or UC visits, less than twice weekly daytime symptoms, less than twice monthly nighttime symptoms Avoid smoke exposure Stay up-to-date with your annual flu vaccines, COVID vaccines and pneumonia vaccines when indicated. Your lung testing today looked okay (recent prednisone use)  Chronic urticaria: not at goal, new problem Started in mid-January.  Occurring daily or every other day. Discussed intrinsic nature of chronic urticaria and symptomatic care. -stop clobetasol and hydrocortisone  -start zyrtec 10-20 mg twice daily as needed for hives. Maximum dose 4 tablets daily. -can use hydroxyzine 25 mg daily as needed for breakthrough sympstoms at night -if not controlled, consider Xolair-an injectable medication for hives.  Chronic Rhinitis: at goal Interim hx: symptoms controlled -continue Flonase 1 spray in each nostril daily or twice daily.  Possible Gastroesophageal Reflux Disease (GERD)-at goal Reports some symptoms suggestive of acid reflux, currently on a trial of omeprazole.I Interim Hx: finished omeprazole trial, asymptomatic. Lifestyle and diet modifications  General Health Maintenance -Ensure vaccinations are up-to-date. -Advise avoidance of all forms of smoking,     Follow up : 4-6 weeks, sooner if needed.  It was a pleasure seeing you again in clinic today! Thank you for allowing me to participate in your care.  Other: samples provided of: spiriva 1.25 mcg   Tonny Bollman, MD  [facility]

## 2023-11-20 NOTE — Addendum Note (Signed)
 Addended by: Kellie Simmering, Luisa Louk on: 11/20/2023 02:36 PM   Modules accepted: Orders

## 2023-11-20 NOTE — Progress Notes (Signed)
 Medication Samples have been provided to the patient.  Drug name: Spiriva       Strength: 1.25 mcg/actuation        Qty: 1  LOT: [id number]  Exp.Date: 04/14/2024  Dosing instructions: 2 puff daily  The patient has been instructed regarding the correct time, dose, and frequency of taking this medication, including desired effects and most common side effects.   Grace Lyons 4:43 PM 11/20/2023

## 2023-11-20 NOTE — Patient Instructions (Addendum)
 Asthma Recurrent episodes of dyspnea, chest pain, and cough with clear sputum production, particularly at night. Symptoms respond to prednisone and albuterol, but recur after cessation of prednisone. History of childhood asthma. Currently on Symbicort (2 puffs BID) and albuterol as needed, but not consistently using Symbicort. Interin HX: still having symptoms multiple times per week, around 3-4 times per week, almost always nightly -Start Symbicort 160 mcg (2 puffs BID) for better control of inflammation and mucus production. Use with spacer and rinse mouth after use. -Add Spiriva 2 puffs daily. Do not use with spacer. -Continue albuterol 2 puffs or 1 vial via nebulizer every 4 hours as needed. - asthma goals: no hospitalizations, 1 or less systemic steroids, no ED or UC visits, less than twice weekly daytime symptoms, less than twice monthly nighttime symptoms Avoid smoke exposure Stay up-to-date with your annual flu vaccines, COVID vaccines and pneumonia vaccines when indicated. Your lung testing today looked okay (recent prednisone use)  Chronic urticaria:  Started in mid-January. Occurring daily or every other day. Discussed intrinsic nature of chronic urticaria and symptomatic care. -stop clobetasol and hydrocortisone  -start zyrtec 10-20 mg twice daily as needed for hives. Maximum dose 4 tablets daily. -can use hydroxyzine 25 mg daily as needed for breakthrough sympstoms at night -if not controlled, consider Xolair-an injectable medication for hives.  Chronic Rhinitis:  Interim hx: symptoms controlled -continue Flonase 1 spray in each nostril daily or twice daily.  Possible Gastroesophageal Reflux Disease (GERD) Reports some symptoms suggestive of acid reflux, currently on a trial of omeprazole.I Interim Hx: finished omeprazole trial, asymptomatic. Lifestyle and diet modifications  General Health Maintenance -Ensure vaccinations are up-to-date. -Advise avoidance of all forms of  smoking,     Follow up : 4-6 weeks, sooner if needed.  It was a pleasure seeing you again in clinic today! Thank you for allowing me to participate in your care.

## 2023-11-20 NOTE — Addendum Note (Signed)
 Addended by: Kellie Simmering, Williams Dietrick on: 11/20/2023 04:49 PM   Modules accepted: Orders

## 2023-11-28 DIAGNOSIS — R509 Fever, unspecified: Secondary | ICD-10-CM | POA: Diagnosis not present

## 2023-11-28 DIAGNOSIS — Z20822 Contact with and (suspected) exposure to covid-19: Secondary | ICD-10-CM | POA: Diagnosis not present

## 2023-11-28 DIAGNOSIS — J069 Acute upper respiratory infection, unspecified: Secondary | ICD-10-CM | POA: Diagnosis not present

## 2023-12-01 DIAGNOSIS — Z20822 Contact with and (suspected) exposure to covid-19: Secondary | ICD-10-CM | POA: Diagnosis not present

## 2023-12-01 DIAGNOSIS — R599 Enlarged lymph nodes, unspecified: Secondary | ICD-10-CM | POA: Diagnosis not present

## 2023-12-01 DIAGNOSIS — J069 Acute upper respiratory infection, unspecified: Secondary | ICD-10-CM | POA: Diagnosis not present

## 2023-12-01 DIAGNOSIS — J029 Acute pharyngitis, unspecified: Secondary | ICD-10-CM | POA: Diagnosis not present

## 2023-12-01 NOTE — Progress Notes (Signed)
 Subjective:   Chief Complaint  Patient presents with  . Sore Throat    Started a couple of days ago. Right sided neck pain, lump. Seen 3 days ago for same and tested negative for flu/covid/strep. Was told to come back if symptoms got worse.  . Nasal Congestion    Sinus pressure  . Cough     History of Present Illness The patient is a 63 year old female who presents for evaluation of sinus congestion.  She reports experiencing sinus congestion, characterized by a sensation of swelling on one side of her face, which has been causing difficulty in eating. She also describes a burning sensation during inhalation, localized to her throat area. Despite the absence of fever, she has been unable to alleviate these symptoms. She also reports a sensation of pressure on the right side of her face and a persistent sore throat. She has been attempting to manage her symptoms with ice and water. She experiences occasional nasal drainage, which she describes as yellowish in color. She has been self-medicating with Tylenol , a nasal spray, and Alka-Seltzer cold tablets. She has not taken ibuprofen  600 mg.  She has a history of asthma and occasionally experiences shortness of breath, although she does not believe this is related to her current symptoms. She uses an inhaler as needed and takes Zyrtec  twice daily. She does not smoke. She reports no ear pain.   Parts of patient history reviewed include PMH, problem list, medications, allergies, and social history.  Objective:   Vitals:   12/01/23 0842  BP: (!) 116/56  BP Location: Right arm  Patient Position: Sitting  Pulse: 73  Resp: 18  Temp: 98.4 F (36.9 C)  TempSrc: Oral  SpO2: 100%  Weight: 77.1 kg (170 lb)  Height: 1.575 m (5' 2)    Physical Exam Vitals and nursing note reviewed.  Constitutional:      General: She is not in acute distress.    Appearance: Normal appearance. She is normal weight. She is ill-appearing. She is not  toxic-appearing.  HENT:     Head: Normocephalic and atraumatic.     Right Ear: Tympanic membrane and ear canal normal.     Left Ear: Tympanic membrane and ear canal normal.     Nose: Congestion and rhinorrhea present.     Right Sinus: Maxillary sinus tenderness present. No frontal sinus tenderness.     Left Sinus: No maxillary sinus tenderness or frontal sinus tenderness.     Mouth/Throat:     Mouth: Mucous membranes are moist.     Pharynx: Oropharynx is clear. Uvula midline. Posterior oropharyngeal erythema and postnasal drip present. No oropharyngeal exudate or uvula swelling.     Tonsils: No tonsillar exudate or tonsillar abscesses. 1+ on the right. 1+ on the left.  Eyes:     Extraocular Movements: Extraocular movements intact.     Conjunctiva/sclera: Conjunctivae normal.     Pupils: Pupils are equal, round, and reactive to light.     Comments: No periorbital edema.  No pain with extraocular movements  Cardiovascular:     Rate and Rhythm: Normal rate and regular rhythm.     Pulses: Normal pulses.     Heart sounds: Normal heart sounds.  Pulmonary:     Effort: Pulmonary effort is normal. No respiratory distress.     Breath sounds: Normal breath sounds. No wheezing.  Abdominal:     Palpations: Abdomen is soft.  Musculoskeletal:     Cervical back: Normal range of motion and  neck supple. No rigidity or tenderness.  Lymphadenopathy:     Cervical: Cervical adenopathy present.  Skin:    Capillary Refill: Capillary refill takes less than 2 seconds.  Neurological:     General: No focal deficit present.     Mental Status: She is alert and oriented to person, place, and time. Mental status is at baseline.     Results for orders placed or performed in visit on 12/01/23  POC Rapid Strep A   Collection Time: 12/01/23  9:13 AM  Result Value Ref Range   Strep A Antigen Negative Negative   Internal Control Acceptable    Kit/Device Lot # [id number]    Kit/Device Expiration Date 04/14/2025    POC SARS-COV-2 SYMPTOMATIC (IDNOW)   Collection Time: 12/01/23  9:14 AM  Result Value Ref Range   SARS-COV-2 IDNOW Negative Negative   SARS IDNOW Information      A rapid, molecular diagnostic test on the IDNOW.  Negative results should be treated as presumptive and, if inconsistent with clinical symptoms should be confirmed with an alternative molecular assay.  POC Influenza A&B NAT (IDNOW)   Collection Time: 12/01/23  9:14 AM  Result Value Ref Range   Influenza A RNA Negative Negative   Influenza B RNA Negative Negative    Lab work reviewed and incorporated into the decision making process    Assessment/Plan:   Assessment & Plan 1. URI  - Medrol  Dosepak - Augmentin  dated not to be filled prior to 10 days from symptom onset  2.  Reactive lymph node -Watchful waiting, follow-up with PCP if no improvement in 4 to 6 weeks   This patient presents for recheck of symptoms suspicious. I suspect viral upper respiratory infection, duration of illness 5 days.  I doubt bacterial sinusitis given duration of illness less than 7 to 10 days.  There is no periorbital edema or pain with extraocular movements worrisome for preseptal/septal cellulitis.  I retested for COVID, strep, and influenza was conducted today to rule out false negatives from prior visit.  Testing is negative in clinic today.  I do not suspect underlying cardiopulmonary process.  Patient is well-appearing without adventitious lung sounds, increased work of breathing, or tachypnea/tachycardia.  Vital signs are unremarkable.  She does have history of asthma however denies increased use of rescue inhaler, shortness of breath, or wheezing.  Doubt pneumonia. Patient is nontoxic-appearing and not in need of emergent medical intervention.  There is cervical lymphadenopathy, advised patient this is likely a reactive lymph node in response to the immune system.  Advised monitoring the node for the next 4 to 6 weeks, if no improvement she should  follow-up with PCP for further evaluation.  Starting patient on Medrol  Dosepak.  A prescription for Augmentin  was dated to be filled 10 days from symptom onset if symptoms do not improve.  Encourage patient to follow up with PCP if no improvement with the above treatment plan.   Home Care   Patient has been instructed on medications, dosages, side effects, and possible interactions as associated with each diagnosis in my impression and plan above. Patient education (verbal/handout) given on diagnosis, pathophysiology, treatment of diagnosis, side effects of medication use for treatment, restrictions while taking medication, and supportive measures.   Patient was instructed on when to follow up and know that they can follow up here, with their PCP, Urgent Care, ED.  They have been instructed that if symptoms worsen that should return to the clinic, go to the nearest ED,  or activate EMS. Red Flags associated with their diagnoses were reviewed and patient was educated on what to do if red.       Patient agreed with plan and voiced understanding.  No barriers to adherence perceived by myself.  Portions of this note may have been dictated using Dragon dictation software/hardware and may contain grammatical or spelling errors.   Electronically signed by:   Sotero Pore, DNP FNP-C [facility]  12/01/2023 9:22 AM

## 2023-12-06 ENCOUNTER — Ambulatory Visit

## 2023-12-06 ENCOUNTER — Ambulatory Visit: Admitting: Student

## 2023-12-25 ENCOUNTER — Ambulatory Visit: Admitting: Internal Medicine

## 2024-01-12 ENCOUNTER — Other Ambulatory Visit: Payer: Self-pay | Admitting: Student

## 2024-01-12 DIAGNOSIS — Z1231 Encounter for screening mammogram for malignant neoplasm of breast: Secondary | ICD-10-CM

## 2024-01-23 ENCOUNTER — Encounter: Payer: Self-pay | Admitting: *Deleted

## 2024-02-26 ENCOUNTER — Ambulatory Visit
Admission: RE | Admit: 2024-02-26 | Discharge: 2024-02-26 | Disposition: A | Discharge status: Home / Self Care | Source: Ambulatory Visit | Attending: Family Medicine | Admitting: Family Medicine

## 2024-02-26 DIAGNOSIS — Z1231 Encounter for screening mammogram for malignant neoplasm of breast: Secondary | ICD-10-CM

## 2024-03-20 ENCOUNTER — Ambulatory Visit (INDEPENDENT_AMBULATORY_CARE_PROVIDER_SITE_OTHER)

## 2024-03-20 VITALS — BP 139/81 | HR 76 | Ht 61.0 in | Wt 171.2 lb

## 2024-03-20 DIAGNOSIS — M7989 Other specified soft tissue disorders: Secondary | ICD-10-CM

## 2024-03-20 DIAGNOSIS — R7303 Prediabetes: Secondary | ICD-10-CM | POA: Diagnosis not present

## 2024-03-20 DIAGNOSIS — R21 Rash and other nonspecific skin eruption: Secondary | ICD-10-CM | POA: Diagnosis not present

## 2024-03-20 DIAGNOSIS — M79643 Pain in unspecified hand: Secondary | ICD-10-CM

## 2024-03-20 LAB — POCT GLYCOSYLATED HEMOGLOBIN (HGB A1C): HbA1c, POC (prediabetic range): 5.8 % (ref 5.7–6.4)

## 2024-03-20 MED ORDER — NAPROXEN 500 MG PO TABS: 500.0000 mg | ORAL_TABLET | Freq: Two times a day (BID) | ORAL | 1 refills | Status: DC

## 2024-03-20 MED ORDER — CLOBETASOL PROPIONATE 0.05 % EX OINT: 1.0000 | TOPICAL_OINTMENT | Freq: Two times a day (BID) | CUTANEOUS | 0 refills | Status: AC

## 2024-03-20 NOTE — Patient Instructions (Addendum)
 Dear Grace Lyons,   It was great seeing you in clinic today! You came in for pain and rash in your hands. Your hand pain is most likely due to osteoarthritis, though we will also investigate possible rheumatoid arthritis. Your rash is likely a form of eczema called dyshidrotic eczema.  There are a few things for you to do outside of clinic: - Take Naproxen  500 mg two times a day with food to help with hand pain - We will get an Xray of your hands; you can go to [facility] when you have time for this - I am checking labs for rheumatoid arthritis; they will be available in MyChart in a few days or so; if anything is abnormal, I will get in touch with you  - For the rash, I am prescribing clobetasol  ointment to use on your hands twice daily for two weeks - Please use gloves when washing dishes/scooping enzyme for your dog; you can use cotton gloves at night after applying the ointment; use gentle soaps to not aggravate the rash.   Thank you for allowing me to be a part of your care team! Alan Flies, MD [facility] [address] [place], [place], [place] [address] [phone]

## 2024-03-20 NOTE — Assessment & Plan Note (Addendum)
 A1c of 5.8, well controlled with lifestyle. - Continue lifestyle management

## 2024-03-20 NOTE — Progress Notes (Signed)
    SUBJECTIVE:   CHIEF COMPLAINT / HPI:   Grace Lyons is a 63 y.o. female presenting for hand pain and rash.  Hand Pain Past couple years (starting 2024) hands have started to have issues.Hard to bend, pain in fingers (especially bottom part of joints) with flares.  Intermittently flares. Now flaring more frequently. Flares will last 2-3 weeks at a time. Hands worsening every year Left pointer finger worst, right pinky also getting hard to bend at times. Bending limited mostly by pain, primarily at the base of the fingers. Patient reports an MRI from years back of lower extremities was suggestive of RA; on review of MRI report fro 2022, no mention of RA. Rheumatoid factor done in 10/2020 negative.  Hand Rash Rash on thenar eminence of left hand since March 2025. Has gotten larger, feels worse. Also with scaly rash on hands. Does report recently having to scoop enzyme for her dog (she is left handed), and was warned not to get this on skin. Has tried hydrocortisone  cream without improvement. Clobetasol  without improvement. Stronger steroid (prescribed by dermatologist), starts with f, no improvement.  Care Gaps - Pap smear - scheduled upcoming at gynecologist - Diabetic eye exam - follows with an eye doctor - A1c - getting in clinic today  OBJECTIVE:   BP 139/81   Pulse 76   Ht 5' 1 (1.549 m)   Wt 171 lb 3.2 oz (77.7 kg)   LMP 08/14/2012   SpO2 100%   BMI 32.35 kg/m   Hands: Edema of left pointer finger. Pain to palpation of left pointer finger, MCP and PIP especially. Can form fist with right hand, cannot bend left pointer finger due to pain (able to move finger, patient tenses/resists given pain). Strength 4/5 in left hand (limited by pain), sensation bilaterally but R>L, rash of left thenar eminence (see photo), similar small rashes in between some fingers of both hands   ASSESSMENT/PLAN:   Assessment & Plan Intermittent pain and swelling of hand Most suspicious for  OA given known OA elsewhere in body, stiffness and edema without changes of joints. Also concern for RA given location of pain primarily at MCP and PIP joints.  - Xrays of both hands ordered  - Start Naprosyn  500 mg twice daily with food; will reassess pain control at next OV - Reassess pain at next OV Rash of hand Most suspicious for dyshidrotic eczema, especially with overall atopic picture. Given rash on hand, will rule out syphilis. - Start Clobetasol  ointment twice daily for two weeks - Gentle soaps, gloves for washing dishes, gloves at night - Labs: RPR Prediabetes A1c of 5.8, well controlled with lifestyle. - Continue lifestyle management   Patient will follow up in 04/2024.  Alan Flies, MD [facility]

## 2024-03-22 LAB — BASIC METABOLIC PANEL WITH GFR
BUN/Creatinine Ratio: 16 (ref 12–28)
BUN: 12 mg/dL (ref 8–27)
CO2: 21 mmol/L (ref 20–29)
Calcium: 10.6 mg/dL — ABNORMAL HIGH (ref 8.7–10.3)
Chloride: 102 mmol/L (ref 96–106)
Creatinine, Ser: 0.76 mg/dL (ref 0.57–1.00)
Glucose: 95 mg/dL (ref 70–99)
Potassium: 4.6 mmol/L (ref 3.5–5.2)
Sodium: 137 mmol/L (ref 134–144)
eGFR: 88 mL/min/1.73 (ref >59)

## 2024-03-22 LAB — RHEUMATOID FACTOR: Rheumatoid fact SerPl-aCnc: <10 [IU]/mL (ref <14.0)

## 2024-03-22 LAB — RPR: RPR Ser Ql: NONREACTIVE

## 2024-03-23 LAB — CYCLIC CITRUL PEPTIDE ANTIBODY, IGG/IGA: Cyclic Citrullin Peptide Ab: 72 U — ABNORMAL HIGH (ref 0–19)

## 2024-03-25 ENCOUNTER — Ambulatory Visit: Payer: Self-pay

## 2024-03-25 ENCOUNTER — Ambulatory Visit
Admission: RE | Admit: 2024-03-25 | Discharge: 2024-03-25 | Disposition: A | Discharge status: Home / Self Care | Source: Ambulatory Visit | Attending: Family Medicine | Admitting: Family Medicine

## 2024-03-25 DIAGNOSIS — M7989 Other specified soft tissue disorders: Secondary | ICD-10-CM

## 2024-03-25 DIAGNOSIS — M79643 Pain in unspecified hand: Secondary | ICD-10-CM

## 2024-03-25 DIAGNOSIS — R768 Other specified abnormal immunological findings in serum: Secondary | ICD-10-CM

## 2024-03-25 DIAGNOSIS — M1812 Unilateral primary osteoarthritis of first carpometacarpal joint, left hand: Secondary | ICD-10-CM | POA: Diagnosis not present

## 2024-03-25 NOTE — Progress Notes (Signed)
 Cyclin citrullin peptide antibody came back elevated, which is a possible sign of rheumatoid arthritis. Given this in setting of patient's symptoms and prior elevated CCP, referral placed to rheumatology. Patient informed via phone call; she is planning to do the Xrays of her hands today, and does report the Naproxen  is helping with her pain.

## 2024-04-19 DIAGNOSIS — Z1330 Encounter for screening examination for mental health and behavioral disorders, unspecified: Secondary | ICD-10-CM | POA: Diagnosis not present

## 2024-04-19 DIAGNOSIS — Z124 Encounter for screening for malignant neoplasm of cervix: Secondary | ICD-10-CM | POA: Diagnosis not present

## 2024-04-19 DIAGNOSIS — Z1211 Encounter for screening for malignant neoplasm of colon: Secondary | ICD-10-CM | POA: Diagnosis not present

## 2024-04-19 DIAGNOSIS — Z01419 Encounter for gynecological examination (general) (routine) without abnormal findings: Secondary | ICD-10-CM | POA: Diagnosis not present

## 2024-04-19 DIAGNOSIS — Z1231 Encounter for screening mammogram for malignant neoplasm of breast: Secondary | ICD-10-CM | POA: Diagnosis not present

## 2024-04-23 NOTE — Progress Notes (Deleted)
    SUBJECTIVE:   Chief compliant/HPI: annual examination  Kiran R Norland is a 63 y.o. who presents today for an annual exam.   Concerns include: *** Rheumatology appointment 10/2024. Xrays with degenerative changes of multiple joints of both hands. On Naprosyn  500 mg BID for pain control.  PMHx of: Asthma, GERD, urticaria, prediabetes FHx significant for: ***  Diet: *** Exercise: *** Sleep: *** Mood: ***  Social Hx Alcohol use: *** Tobacco use: *** Substance use: *** Relationship: *** Sexually active: *** Job/Education: ***  Healthcare Maintenance: - Pap screen - Diabetic eye exam - Diabetic foot exam - Urine microalbumin/Cr - getting today - Pneumococcal vaccine - Flu vaccine - Covid vaccine  History tabs reviewed and updated.   Review of systems form reviewed and notable for ***.   OBJECTIVE:   LMP 08/14/2012   ***  ASSESSMENT/PLAN:   Assessment & Plan Annual physical exam  Prediabetes Urine albumin/Cr done today.   Annual Examination  See AVS for age appropriate recommendations  PHQ score ***, reviewed and discussed.  BP reviewed and at goal ***.  Asked about intimate partner violence and resources given as appropriate  Advance directives discussion ***  Considered the following items based upon USPSTF recommendations: Diabetes screening: {discussed/ordered:14545} Screening for elevated cholesterol: {discussed/ordered:14545} HIV testing: {discussed/ordered:14545} Hepatitis C: {discussed/ordered:14545} Hepatitis B: {discussed/ordered:14545} Syphilis if at high risk: {discussed/ordered:14545} GC/CT {GC/CT screening :23818} Osteoporosis screening considered based upon risk of fracture from [facility] calculator. Major osteoporotic fracture risk is ***%. DEXA {ordered not order:23822}.  Reviewed risk factors for latent tuberculosis and {not indicated/requested/declined:14582}   Discussed family history, BRCA testing {not  indicated/requested/declined:14582}. Tool used to risk stratify was ***.  Cervical cancer screening: {PAPTYPE:23819} Breast cancer screening: {mammoscreen:23820} Colorectal cancer screening: up to date on screening for CRC. Lung cancer screening: {discussed/declined/written pwqn:80301}. See documentation below regarding indications/risks/benefits.  Vaccinations ***.   Follow up in 1 year or sooner if indicated.  MyChart Activation: Already signed up  Alan Flies, MD [facility]

## 2024-04-23 NOTE — Assessment & Plan Note (Signed)
-   Urine alb/Cr collected today

## 2024-04-23 NOTE — Assessment & Plan Note (Deleted)
 Urine albumin/Cr done today.

## 2024-04-23 NOTE — Progress Notes (Signed)
 SUBJECTIVE:   Chief compliant/HPI: annual examination  Grace Lyons is a 63 y.o. who presents today for an annual exam.   Concerns include:  Pain in bilateral hands, rash on left thenar eminence/palm. Naprosyn  500 mg BID for pain control.  Naprosyn  helping with pain. Has tried Aquaphor for rash, hasn't helped a ton. Hand pain: Xrays with degenerative changes of multiple joints of both hands. Rheumatology appointment end of 04/2024.  PMHx of: Asthma, GERD, urticaria, prediabetes FHx significant for: Heart disease in mother (first diagnosed in her 33s-70s); maternal aunt/uncle with strokes No hx of breast, endometrial, ovarian, cervical cancer  Diet: Seasonally up and down; likes pepsi; trying to get a good variety in; eating more at home; occasional fried food Exercise: not really due to leg pain; goes out with her dogs in the backyard Sleep: Sometimes hard to sleep due to pain; usually 10-6am; snores when very tired, normally doesn't snore; wakes up feeling well rested usually Mood: overall okay, sometimes a little depressed with what's going on with her body  Social Hx Alcohol use: None currently; reports heavy drinking in past (most recent episode ~a year ago) Tobacco use: Hx, quit in 1990s Substance use: THC syrup at night Relationship: Been with significant other for 17 years Sexually active: Not really; feels safe and supported in relationship 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   OBJECTIVE:   BP 119/73   Pulse 90   Ht 5' 2 (1.575 m)   Wt  171 lb 12.8 oz (77.9 kg)   LMP 08/14/2012   SpO2 96%   BMI 31.42 kg/m   HEENT: EOMI bilaterally, sclera non injected. Neck supple. Cardiovascular: Regular rate and rhythm, no murmurs/rubs/gallops. Respiratory: Normal work of breathing on room air. Clear to auscultation bilaterally; no wheezes, crackles. Abdomen: Bowel sounds present and normoactive bilaterally. Soft, nondistended, nontender. Derm: Scaling rash of left palm largely unchanged from prior exam. Extremities: Grossly normal strength and sensation of all 4 extremities. Normal diabetic foot exam.  ASSESSMENT/PLAN:   Assessment & Plan Annual physical exam Overall normal. Discussed regular exercise. - Labs: CMP, lipid panel, urine microalbumin/Cr ratio Prediabetes - Urine alb/Cr collected today Intermittent pain and swelling of hand - Naprosyn  refilled Rash of hand Suspect dermatitis given appearance. Patient has been using moisturizer, not steroid on region.  - Encouraged used of hydrocortisone  followed by moisturizer twice daily for 2 weeks. - Hydrocortisone  refilled Encounter for immunization Vaccinations given today include: Flu, Pneumococcal 20-valent.    Annual Examination  See AVS for age appropriate recommendations  PHQ score 5 (q9 negative), reviewed and discussed.  BP reviewed and at goal .  Asked about intimate partner violence; resources not indicated Advance directives discussion deferred  Considered the following items based upon USPSTF recommendations: Diabetes screening: not indicated at this time Screening for elevated cholesterol: ordered HIV testing: not indicated Hepatitis C: not indicated Hepatitis B: not indicated Syphilis if at high risk: not indicated GC/CT not at high risk and not ordered. Osteoporosis screening considered based upon risk of fracture from [facility] calculator. Major osteoporotic  fracture risk is 4.4%. DEXA not ordered.  Reviewed risk factors for latent tuberculosis and not  indicated   Discussed family history, BRCA testing not indicated. Cervical cancer screening: recent pap completed with OB-Gyn, patient to ask office to sent results to our clinic Breast cancer screening: up to date Colorectal cancer screening: up to date on screening for CRC. Lung cancer screening: Not indicated (remote history of tobacco use >15 years ago). See documentation below regarding indications/risks/benefits.    Follow up in 1 year or sooner if indicated.  MyChart Activation: Already signed up  Alan Flies, MD [facility]

## 2024-04-25 ENCOUNTER — Ambulatory Visit (INDEPENDENT_AMBULATORY_CARE_PROVIDER_SITE_OTHER)

## 2024-04-25 VITALS — BP 119/73 | HR 90 | Ht 62.0 in | Wt 171.8 lb

## 2024-04-25 DIAGNOSIS — M7989 Other specified soft tissue disorders: Secondary | ICD-10-CM

## 2024-04-25 DIAGNOSIS — Z23 Encounter for immunization: Secondary | ICD-10-CM

## 2024-04-25 DIAGNOSIS — Z0000 Encounter for general adult medical examination without abnormal findings: Secondary | ICD-10-CM | POA: Diagnosis not present

## 2024-04-25 DIAGNOSIS — R21 Rash and other nonspecific skin eruption: Secondary | ICD-10-CM

## 2024-04-25 DIAGNOSIS — M79643 Pain in unspecified hand: Secondary | ICD-10-CM

## 2024-04-25 DIAGNOSIS — R7303 Prediabetes: Secondary | ICD-10-CM | POA: Diagnosis not present

## 2024-04-25 MED ORDER — HYDROCORTISONE 2.5 % EX OINT: TOPICAL_OINTMENT | Freq: Two times a day (BID) | CUTANEOUS | 1 refills | Status: AC

## 2024-04-25 MED ORDER — NAPROXEN 500 MG PO TABS: 500.0000 mg | ORAL_TABLET | Freq: Two times a day (BID) | ORAL | 0 refills | Status: AC

## 2024-04-25 NOTE — Patient Instructions (Signed)
 Dear Grace Lyons,   It was great seeing you in clinic today! You came in for your yearly physical. You are doing well overall! I'm getting some lab work for you today; results will show up in your MyChart in a few days, and I will reach out if anything is abnormal. We also gave you your flu shot today.  I am glad you were able to get in with the rheumatologist later this month. Continue to use the naprosyn  for pain, and keep moving your fingers! For your rash, use the hydrocortisone  cream on the affected area and use the Aquaphor/Vaseline overtop; try to do this in the morning and night for 2 weeks.  Thank you for allowing me to be a part of your care team! Alan Flies, MD [facility] [address] [place], [place], [place] [address] [phone]

## 2024-04-26 ENCOUNTER — Ambulatory Visit: Payer: Self-pay

## 2024-04-26 LAB — COMPREHENSIVE METABOLIC PANEL WITH GFR
ALT: 17 IU/L (ref 0–32)
AST: 16 IU/L (ref 0–40)
Albumin: 4.4 g/dL (ref 3.9–4.9)
Alkaline Phosphatase: 73 IU/L (ref 44–121)
BUN/Creatinine Ratio: 22 (ref 12–28)
BUN: 18 mg/dL (ref 8–27)
Bilirubin Total: 0.6 mg/dL (ref 0.0–1.2)
CO2: 21 mmol/L (ref 20–29)
Calcium: 10.7 mg/dL — ABNORMAL HIGH (ref 8.7–10.3)
Chloride: 106 mmol/L (ref 96–106)
Creatinine, Ser: 0.83 mg/dL (ref 0.57–1.00)
Globulin, Total: 2.4 g/dL (ref 1.5–4.5)
Glucose: 94 mg/dL (ref 70–99)
Potassium: 4 mmol/L (ref 3.5–5.2)
Sodium: 141 mmol/L (ref 134–144)
Total Protein: 6.8 g/dL (ref 6.0–8.5)
eGFR: 79 mL/min/1.73 (ref >59)

## 2024-04-26 LAB — LIPID PANEL
Chol/HDL Ratio: 2.4 ratio (ref 0.0–4.4)
Cholesterol, Total: 164 mg/dL (ref 100–199)
HDL: 68 mg/dL (ref >39)
LDL Chol Calc (NIH): 82 mg/dL (ref 0–99)
Triglycerides: 71 mg/dL (ref 0–149)
VLDL Cholesterol Cal: 14 mg/dL (ref 5–40)

## 2024-04-26 LAB — MICROALBUMIN / CREATININE URINE RATIO
Creatinine, Urine: 222.1 mg/dL
Microalb/Creat Ratio: 5 mg/g{creat} (ref 0–29)
Microalbumin, Urine: 10.3 ug/mL

## 2024-04-26 NOTE — Progress Notes (Signed)
 Called patient to relay lab results; informed patient that calcium is mildly elevated, and had also been mildly elevated a month ago. As such, recommending lab visit for PTH to be collected; order placed. Will plan for clinic visit following results of PTH lab. Questions answered. Other labs unremarkable, patient informed.  Admin team, could you please help schedule patient for a lab visit?

## 2024-05-09 DIAGNOSIS — M549 Dorsalgia, unspecified: Secondary | ICD-10-CM | POA: Diagnosis not present

## 2024-05-09 DIAGNOSIS — M2550 Pain in unspecified joint: Secondary | ICD-10-CM | POA: Diagnosis not present

## 2024-05-09 DIAGNOSIS — M25562 Pain in left knee: Secondary | ICD-10-CM | POA: Diagnosis not present

## 2024-05-09 DIAGNOSIS — M1990 Unspecified osteoarthritis, unspecified site: Secondary | ICD-10-CM | POA: Diagnosis not present

## 2024-05-09 DIAGNOSIS — M25561 Pain in right knee: Secondary | ICD-10-CM | POA: Diagnosis not present

## 2024-05-09 DIAGNOSIS — L309 Dermatitis, unspecified: Secondary | ICD-10-CM | POA: Diagnosis not present

## 2024-05-09 DIAGNOSIS — M0579 Rheumatoid arthritis with rheumatoid factor of multiple sites without organ or systems involvement: Secondary | ICD-10-CM | POA: Diagnosis not present

## 2024-05-09 DIAGNOSIS — M7989 Other specified soft tissue disorders: Secondary | ICD-10-CM | POA: Diagnosis not present

## 2024-05-09 DIAGNOSIS — M79671 Pain in right foot: Secondary | ICD-10-CM | POA: Diagnosis not present

## 2024-05-09 DIAGNOSIS — M79642 Pain in left hand: Secondary | ICD-10-CM | POA: Diagnosis not present

## 2024-05-09 DIAGNOSIS — M79641 Pain in right hand: Secondary | ICD-10-CM | POA: Diagnosis not present

## 2024-05-09 DIAGNOSIS — M25512 Pain in left shoulder: Secondary | ICD-10-CM | POA: Diagnosis not present

## 2024-05-09 DIAGNOSIS — M79643 Pain in unspecified hand: Secondary | ICD-10-CM | POA: Diagnosis not present

## 2024-05-09 DIAGNOSIS — Z79899 Other long term (current) drug therapy: Secondary | ICD-10-CM | POA: Diagnosis not present

## 2024-05-09 DIAGNOSIS — M79672 Pain in left foot: Secondary | ICD-10-CM | POA: Diagnosis not present

## 2024-05-16 DIAGNOSIS — L308 Other specified dermatitis: Secondary | ICD-10-CM | POA: Diagnosis not present

## 2024-05-20 ENCOUNTER — Other Ambulatory Visit

## 2024-05-22 ENCOUNTER — Ambulatory Visit: Payer: Self-pay

## 2024-05-22 LAB — PARATHYROID HORMONE, INTACT (NO CA): PTH: 58 pg/mL (ref 15–65)

## 2024-06-11 DIAGNOSIS — L239 Allergic contact dermatitis, unspecified cause: Secondary | ICD-10-CM | POA: Diagnosis not present

## 2024-07-18 ENCOUNTER — Ambulatory Visit (INDEPENDENT_AMBULATORY_CARE_PROVIDER_SITE_OTHER): Admitting: Family Medicine

## 2024-07-18 ENCOUNTER — Ambulatory Visit
Admission: RE | Admit: 2024-07-18 | Discharge: 2024-07-18 | Disposition: A | Discharge status: Home / Self Care | Source: Ambulatory Visit | Attending: Family Medicine | Admitting: Family Medicine

## 2024-07-18 VITALS — BP 134/88 | HR 88 | Temp 99.0°F | Ht 62.0 in | Wt 178.0 lb

## 2024-07-18 DIAGNOSIS — R051 Acute cough: Secondary | ICD-10-CM | POA: Diagnosis not present

## 2024-07-18 DIAGNOSIS — J4551 Severe persistent asthma with (acute) exacerbation: Secondary | ICD-10-CM

## 2024-07-18 DIAGNOSIS — R059 Cough, unspecified: Secondary | ICD-10-CM | POA: Diagnosis not present

## 2024-07-18 MED ORDER — BENZONATATE 100 MG PO CAPS: 100.0000 mg | ORAL_CAPSULE | Freq: Two times a day (BID) | ORAL | 0 refills | Status: AC | PRN

## 2024-07-18 MED ORDER — PREDNISONE 20 MG PO TABS: 40.0000 mg | ORAL_TABLET | Freq: Every day | ORAL | 0 refills | Status: AC

## 2024-07-18 NOTE — Patient Instructions (Addendum)
 It was great to see you! Thank you for allowing me to participate in your care!  Our plans for today:   VISIT SUMMARY: Today, we addressed your persistent cough, yellow sputum production, and asthma exacerbation. We also discussed your upper respiratory symptoms, including hoarseness, sore throat, and mild earache.  YOUR PLAN: SEVERE PERSISTENT ASTHMA WITH ACUTE EXACERBATION: You are experiencing a mild asthma exacerbation with nocturnal coughing and hoarseness, likely due to environmental triggers or a viral infection. -You have been prescribed oral steroids for 5 days. -Continue using budesonide  in the morning and at night, and albuterol  as needed. -Consider using Tessalon  Perles for your cough.  ACUTE UPPER RESPIRATORY INFECTION: Your sore throat is improving and is likely due to a viral infection. We also considered COVID-19 and influenza. -A chest x-ray has been ordered to rule out pneumonia. -Please follow up if there is no improvement in one week.   Please arrive 15 minutes PRIOR to your next scheduled appointment time! If you do not, this affects OTHER patients' care.  Take care and seek immediate care sooner if you develop any concerns.   Grace Provencal, MD, PGY-3 [facility] 2:10 PM 07/18/2024  [facility]

## 2024-07-18 NOTE — Progress Notes (Signed)
    SUBJECTIVE:   CHIEF COMPLAINT / HPI: cough  Discussed the use of AI scribe software for clinical note transcription with the patient, who gave verbal consent to proceed.  History of Present Illness Grace Lyons is a 63 year old female with asthma who presents with persistent cough and yellow sputum production.  Cough and sputum production - Persistent cough since last Thursday or Friday - Cough is particularly severe at night, disrupting sleep - Yellow sputum production, sometimes with blood - No fever - No significant nasal congestion  Upper respiratory symptoms - Hoarseness and loss of voice, currently improving - Sore throat, now improving - Mild earache, worse yesterday but improved today - Headaches, attributed to medication use  Asthma and environmental triggers - History of asthma - Uses budesonide  in the morning and at night - Uses albuterol  as needed, used a couple of times recently - Uses Flonase  and Zyrtec , but not during this episode - [place]  Medication use and response - Took Mucinex , DayQuil, and NyQuil with persistent symptoms - Able to eat and drink normally  Associated symptoms and exclusions - No swelling in legs    PERTINENT  PMH / PSH: Severe persistent Asthma, GERD  OBJECTIVE:   BP 134/88   Pulse 88   Temp 99 F (37.2 C)   Ht 5' 2 (1.575 m)   Wt 178 lb (80.7 kg)   LMP 08/14/2012   SpO2 95%   BMI 32.56 kg/m   Physical Exam General: NAD, mildly ill appearing HEENT: MMM, erythematous posterior oropharynx, symmetric tonsills, not enlarged Neuro: A&O Cardiovascular: RRR, no murmurs,  Respiratory: normal WOB on RA, CTAB, no wheezes, ronchi or rales, good air movement, coughing through exam productive Extremities: Moving all 4 extremities equally, no peripheral edema   ASSESSMENT/PLAN:   Assessment & Plan Severe persistent asthma with exacerbation (HCC) Acute cough Mild asthma  exacerbation in the setting of likely viral illness now improving.  Given length of symptoms with regards to cough for 7 days reasonable to pursue chest x-ray to rule out atypical versus typical pneumonia. - Prednisone  40 mg daily for 5 days - Tessalon  Perles as needed - Tylenol  and ibuprofen  as needed - Continue over-the-counter cough medication - Two-view chest x-ray ordered - Follow-up if not improving in 1 week    Return in about 1 week (around 07/25/2024), or if symptoms worsen or fail to improve.  Ozell Provencal, MD, PGY-3 [facility] 2:11 PM 07/18/2024  [facility]

## 2024-07-19 ENCOUNTER — Telehealth: Payer: Self-pay

## 2024-07-19 ENCOUNTER — Encounter: Payer: Self-pay | Admitting: Family Medicine

## 2024-07-19 DIAGNOSIS — R051 Acute cough: Secondary | ICD-10-CM

## 2024-07-19 MED ORDER — GUAIFENESIN-DM 100-10 MG/5ML PO SYRP: 5.0000 mL | ORAL_SOLUTION | ORAL | 0 refills | Status: AC | PRN

## 2024-07-19 NOTE — Telephone Encounter (Signed)
 Patient LVM on nurse line following up on mychart message.   She states that she was told at yesterday's visit that provider was going to send her over an antibiotic for her cough. She reports that she had a rough night last night and sputum now has a brownish color.   She states in VM that she does not want to wait until this afternoon when provider is in to receive abx as she states that she needs this right now.   Called patient back. Advised that CXR was still pending. She is adamant that she is supposed to be receiving abx. Sputum is thicker and brown. Advised her that I am sending message to Dr. Alba now for further advisement. She became upset about having to wait as she sent provider message earlier today and has not heard back. Advised that provider would be in clinic this afternoon. Patient states that she cannot wait that long as she has friends that were going to drive and pick up her prescription. Patient asks that I send message to supervising provider as well.   Forwarding to Dr. Alba and Dr. Anders.   Chiquita JAYSON English, RN

## 2024-07-19 NOTE — Addendum Note (Signed)
 Addended by: ALBA SHARPER on: 07/19/2024 03:13 PM   Modules accepted: Orders

## 2024-07-22 ENCOUNTER — Ambulatory Visit

## 2024-07-24 ENCOUNTER — Ambulatory Visit: Payer: Self-pay | Admitting: Family Medicine

## 2024-07-24 ENCOUNTER — Ambulatory Visit: Payer: Self-pay

## 2024-07-24 NOTE — Telephone Encounter (Signed)
 FYI Only or Action Required?: Action required by provider: scheduled NP appt for pt in 11/2024  and pt will continue to be seen at another facility/current medical center until NP appt.  Patient was last seen in primary care on 07/18/2024 by Alba Sharper, MD.  Called Nurse Triage reporting Cough.  Symptoms began 2.5 weeks ago.  Interventions attempted: Prescription medications: prednisone .  Symptoms are: gradually improving.  Triage Disposition: See PCP When Office is Open (Within 3 Days)  Patient/caregiver understands and will follow disposition?: Yes   Copied from CRM #[id number]. Topic: Clinical - Red Word Triage >> Jul 24, 2024  8:20 AM Antwanette L wrote: Red Word that prompted transfer to Nurse Triage: Patient reports a history of asthma.Since the recent weather change, she has been coughing up yellow mucus.Patient needs to establish care with Jeoffrey Barrio, FNP at [facility]. Reason for Disposition  Cough has been present for > 3 weeks  Answer Assessment - Initial Assessment Questions 1. ONSET: When did the cough begin?      2.5 weeks 2. SEVERITY: How bad is the cough today?      Was Moderate now mild 3. SPUTUM: Describe the color of your sputum (e.g., none, dry cough; clear, white, yellow, green)   Was Brownish none yellowish 4. HEMOPTYSIS: Are you coughing up any blood? If Yes, ask: How much? (e.g., flecks, streaks, tablespoons, etc.)     Had some but none now since taking prednisone  5. DIFFICULTY BREATHING: Are you having difficulty breathing? If Yes, ask: How bad is it? (e.g., mild, moderate, severe)      no 6. FEVER: Do you have a fever? If Yes, ask: What is your temperature, how was it measured, and when did it start?     At first and none now 7. CARDIAC HISTORY: Do you have any history of heart disease? (e.g., heart attack, congestive heart failure)      na 8. LUNG HISTORY: Do you have any history of lung disease?  (e.g., pulmonary  embolus, asthma, emphysema)     Recently diagnosed with asthma 9. PE RISK FACTORS: Do you have a history of blood clots? (or: recent major surgery, recent prolonged travel, bedridden)     na 10. OTHER SYMPTOMS: Do you have any other symptoms? (e.g., runny nose, wheezing, chest pain)       no 11. PREGNANCY: Is there any chance you are pregnant? When was your last menstrual period?       na 12. TRAVEL: Have you traveled out of the country in the last month? (e.g., travel history, exposures)       Na  Pt is currently being seen at another facility and wants to establish care with Jeoffrey Barrio at [facility]: pt will continue to receive healthcare at other facility until NP appt - this is the same facility that recently diagnosed pt with asthma and currently has patient on prednisone  to help with cough and congestion/mucous.  Protocols used: Cough - Acute Productive-A-AH

## 2024-07-25 ENCOUNTER — Telehealth: Payer: Self-pay

## 2024-07-25 NOTE — Telephone Encounter (Signed)
 Patient calls nurse line requesting another round of Prednisone .   She reports improvement after finishing the 5 day course.   She reports she still has some shortness of breath, mainly in the evening. She reports a productive cough with yellow phlegm, however not as much as before.   She denies any worsening symptoms.   She is requesting another course in hopes of knocking this out completely.  Advised will forward to provider who saw patient for concern.

## 2024-07-26 ENCOUNTER — Encounter: Payer: Self-pay | Admitting: Family Medicine

## 2024-07-26 ENCOUNTER — Ambulatory Visit: Admitting: Family Medicine

## 2024-07-26 VITALS — BP 120/62 | HR 72 | Temp 97.9°F | Wt 178.4 lb

## 2024-07-26 DIAGNOSIS — J4551 Severe persistent asthma with (acute) exacerbation: Secondary | ICD-10-CM | POA: Diagnosis not present

## 2024-07-26 MED ORDER — AZITHROMYCIN 500 MG PO TABS: ORAL_TABLET | ORAL | 0 refills | Status: AC

## 2024-07-26 MED ORDER — AZITHROMYCIN 500 MG PO TABS: ORAL_TABLET | ORAL | 0 refills | Status: DC

## 2024-07-26 NOTE — Patient Instructions (Addendum)
 It was wonderful to see you today.  Please bring ALL of your medications with you to every visit.    VISIT SUMMARY: During your visit, we discussed your persistent cough and increased sputum production, which have been ongoing since you completed your prednisone  course. We reviewed your asthma history and current management, as well as your previous pulmonary evaluations.  YOUR PLAN: -ASTHMA WITH ACUTE EXACERBATION: An asthma exacerbation means your asthma symptoms have worsened, likely due to a viral infection. You should use your albuterol  inhaler every four hours to help manage your symptoms even for cough. I have also prescribed an antibiotic for a 3 day course.    INSTRUCTIONS: Please use your albuterol  inhaler every four hours as directed. Take the antibiotics for the full 3 day course. Follow up with us  if you have any concerns or if your symptoms worsen.  You need a follow up for a pap smear and to follow up your asthma.   Contains text generated by [employer].   Thank you for choosing [facility].   Please call [phone] with any questions about today's appointment.  Please be sure to schedule follow up at the front desk before you leave today.   Areta Saliva, MD  Family Medicine

## 2024-07-26 NOTE — Telephone Encounter (Signed)
 Patient reports to [facility] in regards to telephone request.   Patient scheduled for this afternoon at 1:30 for evaluation.

## 2024-07-26 NOTE — Progress Notes (Signed)
° °  SUBJECTIVE:   CHIEF COMPLAINT / HPI:  Discussed the use of AI scribe software for clinical note transcription with the patient, who gave verbal consent to proceed.  History of Present Illness Grace Lyons is a 63 year old female with asthma who presents with persistent cough and sputum production.  Cough and sputum production - Persistent cough with increased sputum production since completing prednisone  course on July 23, 2024 - Initial improvement on prednisone , but sputum never fully resolved and symptoms have worsened again - Sputum color progressed from clear to yellow and brown, with occasional red streaks - Cough is worse at night and disrupts sleep - Partial relief with use of a steamer - No shortness of breath - Significant coughing causing concern and sleep disturbance  Asthma history and management - Asthma managed with budesonide -fomoterol inhaler twice daily and albuterol  as needed - Albuterol  not used regularly since initial improvement  Tobacco and marijuana use history - Former cigarette smoker, quit at age 46 - Prior occasional marijuana use, now stopped during current exacerbation   Prior pulmonary evaluation - Previous pulmonary function testing and allergy clinic assessment negative for COPD - Prior chest x-ray showed clear lungs after her appointment on 12/4.     PERTINENT  PMH / PSH: Asthma  OBJECTIVE:  BP 120/62   Pulse 72   Temp 97.9 F (36.6 C)   Wt 178 lb 6.4 oz (80.9 kg)   LMP 08/14/2012   SpO2 100%   BMI 32.63 kg/m    General: well appearing, in no acute distress CV: RRR, radial pulses equal and palpable Resp: Normal work of breathing on room air, Good air flow, no wheezing, possible faint crackles, no rhonci. Coughing (wet) during exam.   ASSESSMENT/PLAN:   Assessment & Plan Severe persistent asthma with exacerbation (HCC) Asthma exacerbation likely due to viral infection with persistent symptoms for three weeks. Previous  imaging ruled out focal pneumonia. Differential includes post-viral cough, acute bronchitis. Symptoms worsen at night, affecting sleep. - Use albuterol  inhaler every four hours while having symptoms including cough - azithromycin  500 mg x 3 days given worsening of symptoms.  - Follow up if symptoms do not improve. - Will need to consider step up in controller inhaler if patient continues to have multiple exacerbations.   Informed patient that she is due for pap smear. Patient gets these with her OBGYN and will make an appointment there.    Areta Saliva, MD [facility]

## 2024-07-26 NOTE — Addendum Note (Signed)
 Addended by: Adrijana Haros on: 07/26/2024 02:33 PM   Modules accepted: Orders

## 2024-09-20 ENCOUNTER — Encounter: Admitting: Rheumatology

## 2024-10-15 ENCOUNTER — Ambulatory Visit: Admitting: Rheumatology

## 2024-11-25 ENCOUNTER — Ambulatory Visit: Admitting: Family Medicine

## 2024-11-26 ENCOUNTER — Ambulatory Visit: Admitting: Rheumatology

## 2025-01-08 ENCOUNTER — Ambulatory Visit: Admitting: Rheumatology
